# Patient Record
Sex: Female | Born: 1960 | Race: Black or African American | Hispanic: No | Marital: Married | Smoking: Never smoker
Health system: Southern US, Community
[De-identification: ages and names within clinical notes are randomized; demographics above are authoritative.]

## PROBLEM LIST (undated history)

## (undated) DIAGNOSIS — M5136 Other intervertebral disc degeneration, lumbar region: Secondary | ICD-10-CM

## (undated) DIAGNOSIS — K8020 Calculus of gallbladder without cholecystitis without obstruction: Secondary | ICD-10-CM

## (undated) DIAGNOSIS — M51369 Other intervertebral disc degeneration, lumbar region without mention of lumbar back pain or lower extremity pain: Secondary | ICD-10-CM

## (undated) DIAGNOSIS — S82899A Other fracture of unspecified lower leg, initial encounter for closed fracture: Secondary | ICD-10-CM

## (undated) DIAGNOSIS — I10 Essential (primary) hypertension: Secondary | ICD-10-CM

## (undated) HISTORY — PX: TUBAL LIGATION: SHX77

## (undated) HISTORY — PX: CHOLECYSTECTOMY: SHX55

---

## 1998-11-22 ENCOUNTER — Emergency Department (HOSPITAL_COMMUNITY): Admission: EM | Admit: 1998-11-22 | Discharge: 1998-11-22 | Payer: Self-pay | Admitting: Emergency Medicine

## 1998-11-22 ENCOUNTER — Encounter: Payer: Self-pay | Admitting: Emergency Medicine

## 1999-05-05 ENCOUNTER — Emergency Department (HOSPITAL_COMMUNITY): Admission: EM | Admit: 1999-05-05 | Discharge: 1999-05-05 | Payer: Self-pay | Admitting: Emergency Medicine

## 2002-03-10 ENCOUNTER — Emergency Department (HOSPITAL_COMMUNITY): Admission: EM | Admit: 2002-03-10 | Discharge: 2002-03-10 | Payer: Self-pay | Admitting: Emergency Medicine

## 2002-09-06 ENCOUNTER — Emergency Department (HOSPITAL_COMMUNITY): Admission: EM | Admit: 2002-09-06 | Discharge: 2002-09-06 | Payer: Self-pay

## 2006-08-19 ENCOUNTER — Emergency Department (HOSPITAL_COMMUNITY): Admission: EM | Admit: 2006-08-19 | Discharge: 2006-08-19 | Payer: Self-pay | Admitting: Emergency Medicine

## 2007-10-07 ENCOUNTER — Emergency Department (HOSPITAL_COMMUNITY): Admission: EM | Admit: 2007-10-07 | Discharge: 2007-10-07 | Payer: Self-pay | Admitting: *Deleted

## 2009-10-22 ENCOUNTER — Emergency Department (HOSPITAL_COMMUNITY): Admission: EM | Admit: 2009-10-22 | Discharge: 2009-10-22 | Payer: Self-pay | Admitting: Family Medicine

## 2009-12-13 ENCOUNTER — Encounter: Admission: RE | Admit: 2009-12-13 | Discharge: 2009-12-13 | Payer: Self-pay | Admitting: Obstetrics and Gynecology

## 2009-12-16 ENCOUNTER — Encounter: Admission: RE | Admit: 2009-12-16 | Discharge: 2009-12-16 | Payer: Self-pay | Admitting: Obstetrics and Gynecology

## 2010-03-16 HISTORY — PX: BREAST SURGERY: SHX581

## 2010-05-30 LAB — POCT URINALYSIS DIPSTICK
Glucose, UA: NEGATIVE mg/dL
Hgb urine dipstick: NEGATIVE
Specific Gravity, Urine: 1.025 (ref 1.005–1.030)
Urobilinogen, UA: 0.2 mg/dL (ref 0.0–1.0)

## 2010-11-04 ENCOUNTER — Emergency Department (HOSPITAL_COMMUNITY): Payer: No Typology Code available for payment source

## 2010-11-04 ENCOUNTER — Emergency Department
Admit: 2010-11-04 | Discharge: 2010-11-04 | Disposition: A | Discharge status: Home / Self Care | Payer: No Typology Code available for payment source | Attending: Emergency Medicine | Admitting: Emergency Medicine

## 2010-11-04 ENCOUNTER — Emergency Department (HOSPITAL_COMMUNITY)
Admission: EM | Admit: 2010-11-04 | Discharge: 2010-11-04 | Disposition: A | Discharge status: Home / Self Care | Payer: No Typology Code available for payment source | Attending: Emergency Medicine | Admitting: Emergency Medicine

## 2010-11-04 DIAGNOSIS — R071 Chest pain on breathing: Secondary | ICD-10-CM | POA: Insufficient documentation

## 2010-11-04 DIAGNOSIS — R0609 Other forms of dyspnea: Secondary | ICD-10-CM | POA: Insufficient documentation

## 2010-11-04 DIAGNOSIS — N63 Unspecified lump in unspecified breast: Secondary | ICD-10-CM | POA: Insufficient documentation

## 2010-11-04 DIAGNOSIS — N6320 Unspecified lump in the left breast, unspecified quadrant: Secondary | ICD-10-CM

## 2010-11-04 DIAGNOSIS — R0989 Other specified symptoms and signs involving the circulatory and respiratory systems: Secondary | ICD-10-CM | POA: Insufficient documentation

## 2010-11-04 LAB — POCT I-STAT, CHEM 8
Chloride: 109 mEq/L (ref 96–112)
Creatinine, Ser: 1 mg/dL (ref 0.50–1.10)
Glucose, Bld: 91 mg/dL (ref 70–99)
Potassium: 4.2 mEq/L (ref 3.5–5.1)

## 2010-11-04 LAB — CBC
HCT: 35.6 % — ABNORMAL LOW (ref 36.0–46.0)
Hemoglobin: 12.1 g/dL (ref 12.0–15.0)
MCH: 32.7 pg (ref 26.0–34.0)
MCHC: 34 g/dL (ref 30.0–36.0)

## 2010-11-04 LAB — DIFFERENTIAL
Lymphocytes Relative: 35 % (ref 12–46)
Monocytes Absolute: 0.7 10*3/uL (ref 0.1–1.0)
Monocytes Relative: 8 % (ref 3–12)
Neutro Abs: 4.6 10*3/uL (ref 1.7–7.7)

## 2010-11-04 LAB — CK TOTAL AND CKMB (NOT AT ARMC)
CK, MB: 1.5 ng/mL (ref 0.3–4.0)
Relative Index: INVALID (ref 0.0–2.5)
Total CK: 63 U/L (ref 7–177)

## 2010-11-04 LAB — POCT I-STAT TROPONIN I

## 2010-11-04 MED ORDER — IOHEXOL 300 MG/ML  SOLN
100.0000 mL | Freq: Once | INTRAMUSCULAR | Status: AC | PRN
  Administered 2010-11-04: 100 mL via INTRAVENOUS

## 2011-04-13 ENCOUNTER — Other Ambulatory Visit: Payer: Self-pay | Admitting: Obstetrics and Gynecology

## 2011-04-13 DIAGNOSIS — N6009 Solitary cyst of unspecified breast: Secondary | ICD-10-CM

## 2011-04-24 ENCOUNTER — Ambulatory Visit
Admission: RE | Admit: 2011-04-24 | Discharge: 2011-04-24 | Disposition: A | Discharge status: Home / Self Care | Payer: No Typology Code available for payment source | Source: Ambulatory Visit | Attending: Obstetrics and Gynecology | Admitting: Obstetrics and Gynecology

## 2011-04-24 DIAGNOSIS — N6009 Solitary cyst of unspecified breast: Secondary | ICD-10-CM

## 2012-04-07 ENCOUNTER — Emergency Department (HOSPITAL_COMMUNITY)
Admission: EM | Admit: 2012-04-07 | Discharge: 2012-04-07 | Disposition: A | Discharge status: Home / Self Care | Payer: No Typology Code available for payment source | Attending: Emergency Medicine | Admitting: Emergency Medicine

## 2012-04-07 ENCOUNTER — Encounter (HOSPITAL_COMMUNITY): Payer: Self-pay | Admitting: Emergency Medicine

## 2012-04-07 ENCOUNTER — Emergency Department (HOSPITAL_COMMUNITY): Payer: No Typology Code available for payment source

## 2012-04-07 DIAGNOSIS — M62830 Muscle spasm of back: Secondary | ICD-10-CM

## 2012-04-07 DIAGNOSIS — Z8719 Personal history of other diseases of the digestive system: Secondary | ICD-10-CM | POA: Insufficient documentation

## 2012-04-07 DIAGNOSIS — R0602 Shortness of breath: Secondary | ICD-10-CM | POA: Insufficient documentation

## 2012-04-07 DIAGNOSIS — M62838 Other muscle spasm: Secondary | ICD-10-CM | POA: Insufficient documentation

## 2012-04-07 DIAGNOSIS — R071 Chest pain on breathing: Secondary | ICD-10-CM | POA: Insufficient documentation

## 2012-04-07 DIAGNOSIS — R0789 Other chest pain: Secondary | ICD-10-CM

## 2012-04-07 HISTORY — DX: Calculus of gallbladder without cholecystitis without obstruction: K80.20

## 2012-04-07 LAB — CBC
MCH: 34.8 pg — ABNORMAL HIGH (ref 26.0–34.0)
MCV: 100 fL (ref 78.0–100.0)
Platelets: 213 10*3/uL (ref 150–400)
RBC: 4.08 MIL/uL (ref 3.87–5.11)
RDW: 13 % (ref 11.5–15.5)

## 2012-04-07 LAB — BASIC METABOLIC PANEL
BUN: 8 mg/dL (ref 6–23)
CO2: 27 mEq/L (ref 19–32)
Calcium: 9.7 mg/dL (ref 8.4–10.5)
Creatinine, Ser: 0.91 mg/dL (ref 0.50–1.10)

## 2012-04-07 LAB — POCT I-STAT TROPONIN I

## 2012-04-07 LAB — PROTIME-INR
INR: 0.97 (ref 0.00–1.49)
Prothrombin Time: 12.8 seconds (ref 11.6–15.2)

## 2012-04-07 MED ORDER — ASPIRIN 325 MG PO TABS
325.0000 mg | ORAL_TABLET | ORAL | Status: AC
  Administered 2012-04-07: 325 mg via ORAL
  Filled 2012-04-07: qty 1

## 2012-04-07 MED ORDER — OXYCODONE-ACETAMINOPHEN 5-325 MG PO TABS: 2.0000 | ORAL_TABLET | ORAL | Status: DC | PRN

## 2012-04-07 MED ORDER — DIAZEPAM 5 MG PO TABS
5.0000 mg | ORAL_TABLET | Freq: Once | ORAL | Status: AC
  Administered 2012-04-07: 5 mg via ORAL
  Filled 2012-04-07: qty 1

## 2012-04-07 MED ORDER — NITROGLYCERIN 0.4 MG SL SUBL
0.4000 mg | SUBLINGUAL_TABLET | SUBLINGUAL | Status: DC | PRN
  Administered 2012-04-07: 0.4 mg via SUBLINGUAL
  Filled 2012-04-07: qty 25

## 2012-04-07 MED ORDER — IBUPROFEN 800 MG PO TABS: 800.0000 mg | ORAL_TABLET | Freq: Three times a day (TID) | ORAL | Status: DC

## 2012-04-07 MED ORDER — CYCLOBENZAPRINE HCL 10 MG PO TABS: 10.0000 mg | ORAL_TABLET | Freq: Two times a day (BID) | ORAL | Status: DC | PRN

## 2012-04-07 MED ORDER — KETOROLAC TROMETHAMINE 30 MG/ML IJ SOLN
30.0000 mg | Freq: Once | INTRAMUSCULAR | Status: AC
  Administered 2012-04-07: 30 mg via INTRAVENOUS
  Filled 2012-04-07: qty 1

## 2012-04-07 NOTE — ED Notes (Addendum)
Pt reports chest pain since 8 am this morning with pain on inspiration. Pain is located around right breast. Pt states she has had muscle spasms before and this feels the same.  Pt reports shortness of breath also.

## 2012-04-07 NOTE — ED Provider Notes (Addendum)
History  This chart was scribed for Glynn Octave, MD by Bennett Scrape, ED Scribe. This patient was seen in room D32C/D32C and the patient's care was started at 2:53 PM.  CSN: [id number]  Arrival date & time 04/07/12  1422   First MD Initiated Contact with Patient 04/07/12 1453      Chief Complaint  Patient presents with  . Chest Pain     The history is provided by the patient. No language interpreter was used.    Jamie Jenkins is a 52 y.o. female who presents to the Emergency Department complaining of approximately 7 hours (started at 8:30 AM) of sudden onset, waxing and waning, constant right-sided CP starting at the right sternum that radiates around to the mid back with associated SOB that she woke up with. She denies radiation down her right arm or up to her right neck. She states that the pain is worse with movement of the right arm and pressure on the back. She reports prior episodes of similar pain diagnosed as muscle spasms, the last episode occuring one year ago, and states that she was given muscle relaxants with improvement during those prior episodes. She denies any recent falls, traumas or rashes to the area. She denies having a h/o heart or lung problems, DM, HTN and HLD. She denies having prior cath or stress tests performed. She denies rash, abdominal pain, fevers, cough, diaphoresis and nausea as associated symptoms. She denies being on birth control currently. She has a h/o gallstones but denies similarities.   Past Medical History  Diagnosis Date  . Gall stones     History reviewed. No pertinent past surgical history.  History reviewed. No pertinent family history.  History  Substance Use Topics  . Smoking status: Not on file  . Smokeless tobacco: Not on file  . Alcohol Use:     No OB history provided.  Review of Systems  A complete 10 system review of systems was obtained and all systems are negative except as noted in the HPI and PMH.   Allergies   Review of patient's allergies indicates no known allergies.  Home Medications  No current outpatient prescriptions on file.  Triage vitals: BP 168/95  Pulse 67  Temp 97.6 F (36.4 C) (Oral)  Resp 19  SpO2 100%  Physical Exam  Nursing note and vitals reviewed. Constitutional: She is oriented to person, place, and time. She appears well-developed and well-nourished. No distress.       Uncomfortable appearing  HENT:  Head: Normocephalic and atraumatic.  Mouth/Throat: Oropharynx is clear and moist.  Eyes: Conjunctivae normal and EOM are normal. Pupils are equal, round, and reactive to light.  Neck: Neck supple. No tracheal deviation present.  Cardiovascular: Normal rate, regular rhythm and intact distal pulses.   Pulmonary/Chest: Effort normal and breath sounds normal. No respiratory distress. She exhibits tenderness (right chest wall tenderness to palpation).  Abdominal: Soft. There is no tenderness.  Musculoskeletal: Normal range of motion. She exhibits tenderness. She exhibits no edema.       Right upper thoracic paraspinal tenderness and spasm, no C or L spine pain  Neurological: She is alert and oriented to person, place, and time.       Equal grip strengths, 5/5 strength throughout  Skin: Skin is warm and dry.  Psychiatric: She has a normal mood and affect. Her behavior is normal.    ED Course  Procedures (including critical care time)  DIAGNOSTIC STUDIES: Oxygen Saturation is 100% on room  air, normal by my interpretation.    COORDINATION OF CARE: 2:32 PM- Acknowledged 0.4 mg nitro ordered  2:45 PM- Acknowledged 325 mg ASA ordered  3:14 PM- Advised pt that her pain is atypical and therefore, I do not believe that it cardiac related. Discussed treatment plan which includes pain medication,CXR, CBC panel, troponin and d-dimer with pt at bedside and pt agreed to plan.   3:15 PM- Ordered 30 mg Toradol injection and 5 mg Valium tablet  Labs Reviewed  CBC - Abnormal;  Notable for the following:    MCH 34.8 (*)     All other components within normal limits  BASIC METABOLIC PANEL - Abnormal; Notable for the following:    Glucose, Bld 115 (*)     GFR calc non Af Amer 72 (*)     GFR calc Af Amer 83 (*)     All other components within normal limits  TROPONIN I  PROTIME-INR  D-DIMER, QUANTITATIVE  POCT I-STAT TROPONIN I   Dg Chest 2 View  04/07/2012  *RADIOLOGY REPORT*  Clinical Data: Chest pain  CHEST - 2 VIEW  Comparison: Chest x-ray of 11/04/2010 and CT angio chest of the same date  Findings: No active infiltrate or effusion is seen.  Mild linear scarring or atelectasis is noted in the right lower lobe posteriorly.  Mediastinal contours appear stable.  The heart is within normal limits in size.  No skeletal abnormality is seen. Surgical clips are noted in the right upper quadrant from prior cholecystectomy.  IMPRESSION: Stable chest x-ray.  No active lung disease.  Mild linear atelectasis or scarring in the right lower lobe.   Original Report Authenticated By: Dwyane Dee, M.D.      No diagnosis found.    MDM  Right-sided chest and upper back pain awoke the patient from sleep. Constant associated with shortness of breath and pain with breathing. History of similar pain due to muscle spasm. Denies cardiac or pulmonary history. Never has had cardiac workup.  Patient is very uncomfortable with reproducible right-sided chest and upper back pain with spasm. Pain is worse with arm movement. It is clearly atypical for ACS or PE.  Chest x-ray negative. D-dimer negative, troponin negative. No neuro or pulse deficits to suggest dissection. Bilateral blood pressures equal. Pain improved with medication. Pain consistent with chest wall pain and spasm. Atypical for ACS or PE. Pain worse with palpation and worse with arm movement. Patient reassured.   Date: 04/07/2012  Rate: 65  Rhythm: normal sinus rhythm  QRS Axis: normal  Intervals: normal  ST/T Wave  abnormalities: nonspecific ST changes  Conduction Disutrbances:none  Narrative Interpretation: Unchanged septal Q waves  Old EKG Reviewed: unchanged   I personally performed the services described in this documentation, which was scribed in my presence. The recorded information has been reviewed and is accurate.    Glynn Octave, MD 04/07/12 1702  Glynn Octave, MD 04/07/12 [phone]

## 2012-04-07 NOTE — ED Notes (Signed)
C/o right side CP onset upon awakening at 0800. Denies SOB, n/v, diaphoresis, cold, cough. Pain constant with breathing which worsens with deeper breaths, palpation & certain mvmt. States had exact same kind of pain in chest in past & was Dx with muscle spasms. Denies any heavy lifting or injury

## 2012-06-15 ENCOUNTER — Encounter (HOSPITAL_COMMUNITY): Payer: Self-pay | Admitting: *Deleted

## 2012-06-15 ENCOUNTER — Emergency Department (HOSPITAL_COMMUNITY)
Admission: EM | Admit: 2012-06-15 | Discharge: 2012-06-15 | Disposition: A | Discharge status: Home / Self Care | Payer: PRIVATE HEALTH INSURANCE | Attending: Emergency Medicine | Admitting: Emergency Medicine

## 2012-06-15 ENCOUNTER — Emergency Department (HOSPITAL_COMMUNITY): Payer: PRIVATE HEALTH INSURANCE

## 2012-06-15 DIAGNOSIS — Z8719 Personal history of other diseases of the digestive system: Secondary | ICD-10-CM | POA: Insufficient documentation

## 2012-06-15 DIAGNOSIS — S92912A Unspecified fracture of left toe(s), initial encounter for closed fracture: Secondary | ICD-10-CM

## 2012-06-15 DIAGNOSIS — IMO0002 Reserved for concepts with insufficient information to code with codable children: Secondary | ICD-10-CM | POA: Insufficient documentation

## 2012-06-15 DIAGNOSIS — Y929 Unspecified place or not applicable: Secondary | ICD-10-CM | POA: Insufficient documentation

## 2012-06-15 DIAGNOSIS — Y939 Activity, unspecified: Secondary | ICD-10-CM | POA: Insufficient documentation

## 2012-06-15 DIAGNOSIS — S92919A Unspecified fracture of unspecified toe(s), initial encounter for closed fracture: Secondary | ICD-10-CM | POA: Insufficient documentation

## 2012-06-15 NOTE — ED Notes (Signed)
Pt reports hitting 4th toe on left foot on table one week ago and injuring again last night by kicking shoe.  Pt reports pain 8/10 which increases with movement and she feels as if she cannot move the toe. No obvious deformity no obvious swelling.  Pt alert oriented X4

## 2012-06-15 NOTE — ED Notes (Signed)
Pt reports hitting left foot 4th toe over one week ago and still having pain and swelling. Ambulatory at triage.

## 2012-06-15 NOTE — ED Provider Notes (Signed)
Medical screening examination/treatment/procedure(s) were performed by non-physician practitioner and as supervising physician I was immediately available for consultation/collaboration.   Jelina Paulsen III, MD 06/15/12 2114 

## 2012-06-15 NOTE — ED Provider Notes (Signed)
History     CSN: [id number]  Arrival date & time 06/15/12  0915   First MD Initiated Contact with Patient 06/15/12 [phone]      Chief Complaint  Patient presents with  . Toe Pain    (Consider location/radiation/quality/duration/timing/severity/associated sxs/prior treatment) HPI  SUBJECTIVE: Jamie Jenkins is a 52 y.o. female who sustained a left toe injury 2 day(s) ago. Mechanism of injury: stubbed her left 4th toe against her husbands shoe. Immediate symptoms: immediate pain, immediate swelling. Symptoms have been acute since that time. Prior history of related problems: no prior problems with this area in the past.    Past Medical History  Diagnosis Date  . Gall stones     History reviewed. No pertinent past surgical history.  History reviewed. No pertinent family history.  History  Substance Use Topics  . Smoking status: Not on file  . Smokeless tobacco: Not on file  . Alcohol Use: No    OB History   Grav Para Term Preterm Abortions TAB SAB Ect Mult Living                  Review of Systems  Constitutional: Negative for fever and chills.  HENT: Negative for trouble swallowing.   Respiratory: Negative for shortness of breath.   Cardiovascular: Negative for chest pain.  Gastrointestinal: Negative for nausea, vomiting, abdominal pain, diarrhea and constipation.  Genitourinary: Negative for dysuria and hematuria.  Musculoskeletal: Positive for joint swelling and gait problem. Negative for myalgias and arthralgias.  Skin: Negative for rash.  Neurological: Negative for numbness.  All other systems reviewed and are negative.    Allergies  Review of patient's allergies indicates no known allergies.  Home Medications  No current outpatient prescriptions on file.  BP 164/91  Pulse 79  Temp(Src) 98 F (36.7 C) (Oral)  Resp 18  SpO2 97%  LMP 06/15/2012  Physical Exam  Nursing note and vitals reviewed. Constitutional: She is oriented to person, place,  and time. She appears well-developed and well-nourished. No distress.  HENT:  Head: Normocephalic and atraumatic.  Mouth/Throat: No oropharyngeal exudate.  Eyes: Conjunctivae are normal. Pupils are equal, round, and reactive to light. No scleral icterus.  Neck: Normal range of motion.  Cardiovascular: Normal rate, regular rhythm, normal heart sounds and intact distal pulses.   Pulmonary/Chest: Effort normal and breath sounds normal. No respiratory distress.  Abdominal: Soft. Bowel sounds are normal. There is no tenderness.  Musculoskeletal: Normal range of motion. She exhibits edema and tenderness.  Swelling and pain left 3rd and 4th toes. Cap refill<3 sec.  Neurological: She is alert and oriented to person, place, and time.  Skin: Skin is warm and dry. She is not diaphoretic.    ED Course  Procedures (including critical care time)  Labs Reviewed - No data to display Dg Foot Complete Left  06/15/2012  *RADIOLOGY REPORT*  Clinical Data: Fourth toe pain post injury  LEFT FOOT - COMPLETE 3+ VIEW  Comparison: None.  Findings: Three views of the left foot submitted.  There is oblique nondisplaced fracture proximal phalanx fourth toe.  IMPRESSION: Oblique nondisplaced fracture proximal phalanx fourth toe.   Original Report Authenticated By: Natasha Mead, M.D.      1. Fracture of toe of left foot, closed, initial encounter       MDM  12:13 PM BP 164/91  Pulse 79  Temp(Src) 98 F (36.7 C) (Oral)  Resp 18  SpO2 97%  LMP 06/15/2012 4th toe fracture. RICE and work note.  Supportive care with buddy tape, post iop shoe, and crutches.  Follow up with PCP.         Arthor Captain, PA-C 06/15/12 1238

## 2012-08-24 ENCOUNTER — Encounter (HOSPITAL_COMMUNITY): Payer: Self-pay | Admitting: Physical Medicine and Rehabilitation

## 2012-08-24 ENCOUNTER — Emergency Department (HOSPITAL_COMMUNITY)
Admission: EM | Admit: 2012-08-24 | Discharge: 2012-08-24 | Disposition: A | Discharge status: Home / Self Care | Payer: PRIVATE HEALTH INSURANCE | Attending: Emergency Medicine | Admitting: Emergency Medicine

## 2012-08-24 DIAGNOSIS — IMO0002 Reserved for concepts with insufficient information to code with codable children: Secondary | ICD-10-CM | POA: Insufficient documentation

## 2012-08-24 DIAGNOSIS — L02412 Cutaneous abscess of left axilla: Secondary | ICD-10-CM

## 2012-08-24 DIAGNOSIS — Z8719 Personal history of other diseases of the digestive system: Secondary | ICD-10-CM | POA: Insufficient documentation

## 2012-08-24 MED ORDER — MIDAZOLAM HCL 2 MG/2ML IJ SOLN
2.0000 mg | Freq: Once | INTRAMUSCULAR | Status: AC
  Administered 2012-08-24: 2 mg via INTRAVENOUS
  Filled 2012-08-24: qty 2

## 2012-08-24 MED ORDER — CEPHALEXIN 500 MG PO CAPS: 500.0000 mg | ORAL_CAPSULE | Freq: Three times a day (TID) | ORAL | Status: DC

## 2012-08-24 MED ORDER — PROPOFOL 10 MG/ML IV BOLUS
1.0000 mg/kg | Freq: Once | INTRAVENOUS | Status: AC
  Administered 2012-08-24: 40 mg via INTRAVENOUS
  Filled 2012-08-24 (×2): qty 20

## 2012-08-24 MED ORDER — OXYCODONE-ACETAMINOPHEN 5-325 MG PO TABS
1.0000 | ORAL_TABLET | Freq: Once | ORAL | Status: AC
  Administered 2012-08-24: 1 via ORAL
  Filled 2012-08-24: qty 1

## 2012-08-24 MED ORDER — OXYCODONE-ACETAMINOPHEN 5-325 MG PO TABS: 1.0000 | ORAL_TABLET | ORAL | Status: DC | PRN

## 2012-08-24 MED ORDER — PROPOFOL 10 MG/ML IV BOLUS
INTRAVENOUS | Status: AC | PRN
  Administered 2012-08-24: 20 mg via INTRAVENOUS

## 2012-08-24 MED ORDER — FENTANYL CITRATE 0.05 MG/ML IJ SOLN
100.0000 ug | Freq: Once | INTRAMUSCULAR | Status: AC
  Administered 2012-08-24: 100 ug via INTRAVENOUS
  Filled 2012-08-24: qty 2

## 2012-08-24 NOTE — ED Provider Notes (Signed)
History     CSN: [id number]  Arrival date & time 08/24/12  6578   First MD Initiated Contact with Patient 08/24/12 [phone]      Chief Complaint  Patient presents with  . Abscess    (Consider location/radiation/quality/duration/timing/severity/associated sxs/prior treatment) HPI Jamie Jenkins is a 52 y.o. female who presents to ED with complaint of left axillary abscess. States it started 6 days ago, gradually worsening. States tried over the counter "boil ease" and putting warm compresses with no relief. States hx of the same 15 years ago. Pt denies fever, chills, malaise. No drainage. No other complaints.    Past Medical History  Diagnosis Date  . Gall stones     No past surgical history on file.  No family history on file.  History  Substance Use Topics  . Smoking status: Never Smoker   . Smokeless tobacco: Not on file  . Alcohol Use: No    OB History   Grav Para Term Preterm Abortions TAB SAB Ect Mult Living                  Review of Systems  Constitutional: Negative for fever and chills.  Respiratory: Negative.   Cardiovascular: Negative.   Skin:       Positive for an abscess    Allergies  Review of patient's allergies indicates no known allergies.  Home Medications  No current outpatient prescriptions on file.  BP 162/96  Pulse 76  Temp(Src) 98 F (36.7 C) (Oral)  Resp 18  SpO2 98%  Physical Exam  Nursing note and vitals reviewed. Constitutional: She appears well-developed and well-nourished. No distress.  Eyes: Conjunctivae are normal.  Neck: Neck supple.  Cardiovascular: Normal rate, regular rhythm and normal heart sounds.   Pulmonary/Chest: Effort normal and breath sounds normal. No respiratory distress. She has no wheezes. She has no rales.  Musculoskeletal:  Two large abscess to the left axilla with mild surrounding erythema, tender to palpation.   Skin: Skin is warm and dry.    ED Course  Procedures (including critical care  time)  Pt with severe pain, unable to perform a good exam. She is resistant to any procedures. Discussed conscious sedation. Will proceed.   INCISION AND DRAINAGE Performed by: Jaynie Crumble A Consent: Verbal consent obtained. Risks and benefits: risks, benefits and alternatives were discussed Type: abscess  Body area: left axilla  Anesthesia: local infiltration  Incision was made with a scalpel.  Local anesthetic: lidocaine 2% w epinephrine  Anesthetic total: 3 ml  Complexity: complex Blunt dissection to break up loculations  Drainage: purulent, thick material expressed as well as clear and purulent drainage  Drainage amount: large  Packing material: 1/4 in iodoform gauze  Patient tolerance: Patient tolerated the procedure well with no immediate complications.     1. Abscess of left axilla       MDM  Pt with large abscess to left axilla. conscious sedation used for procedure. I&D performed. Packed. Pt monitored for an hour post procedure. VS normal. Pt is AAO x3 and in no distress, reporting pain to left axilla. PT otherwise non toxic, afebrile, no sings of systemic infection. Pt will be d/c home with antibiotic and pain medication with follow up in 2 days.   Filed Vitals:   08/24/12 1005 08/24/12 1015 08/24/12 1031 08/24/12 1058  BP: 132/72 119/63 115/61 123/60  Pulse: 68 69 62 68  Temp:      TempSrc:      Resp: 18 20  20   Weight:      SpO2: 99% 98% 99% 98%           Lottie Mussel, PA-C 08/24/12 1116

## 2012-08-24 NOTE — ED Notes (Signed)
Pt presents to department for evaluation of multiple abscess' under L arm. Ongoing since Sunday. States pain and swelling has become worse since onset. 10/10 pain at the time. Pt is alert and oriented x4. No signs of acute distress noted.

## 2012-08-24 NOTE — ED Provider Notes (Signed)
Pt presents with L axillary abscess which started on Thursday - gradually worsening and on exam had swelling, induration and fluctuance of what appears to be a large abscess in L axilla.  No fevers, not a DM, lungs and heart clear.  Will assist with sedation as the pt is in severe pain from this and is avoiding any examination of the abscess.  Medical screening examination/treatment/procedure(s) were conducted as a shared visit with non-physician practitioner(s) and myself.  I personally evaluated the patient during the encounter    Vida Roller, MD 08/24/12 [phone]

## 2012-08-24 NOTE — ED Provider Notes (Signed)
Medical screening examination/treatment/procedure(s) were conducted as a shared visit with non-physician practitioner(s) and myself.  I personally evaluated the patient during the encounter   I personally evaluated the pt along with the PA, I also performed procedural sedation for incision and drainage for the large, tender appearing abscess.  Procedural sedation Performed by: Vida Roller Consent: Verbal consent obtained. Risks and benefits: risks, benefits and alternatives were discussed Required items: required blood products, implants, devices, and special equipment available Patient identity confirmed: arm band and provided demographic data Time out: Immediately prior to procedure a "time out" was called to verify the correct patient, procedure, equipment, support staff and site/side marked as required.  Sedation type: moderate (conscious) sedation NPO time confirmed and considedered  Sedatives: PROPOFOL, versed, fentanyl  Physician Time at Bedside: 25  Vitals: Vital signs were monitored during sedation. Cardiac Monitor, pulse oximeter Patient tolerance: Patient tolerated the procedure well with no immediate complications. Comments: Pt with uneventful recovered. Returned to pre-procedural sedation baseline     Vida Roller, MD 08/24/12 [phone]

## 2012-08-24 NOTE — ED Notes (Addendum)
Pt tolerating procedure without difficulty. Vital signs stable. PA at bedside packing abscess, pt is beginning to awake and is responding to verbal stimuli.

## 2012-08-26 ENCOUNTER — Emergency Department (HOSPITAL_COMMUNITY)
Admission: EM | Admit: 2012-08-26 | Discharge: 2012-08-26 | Disposition: A | Discharge status: Home / Self Care | Payer: PRIVATE HEALTH INSURANCE | Attending: Emergency Medicine | Admitting: Emergency Medicine

## 2012-08-26 ENCOUNTER — Encounter (HOSPITAL_COMMUNITY): Payer: Self-pay | Admitting: Emergency Medicine

## 2012-08-26 DIAGNOSIS — Z4801 Encounter for change or removal of surgical wound dressing: Secondary | ICD-10-CM | POA: Insufficient documentation

## 2012-08-26 DIAGNOSIS — Z5189 Encounter for other specified aftercare: Secondary | ICD-10-CM

## 2012-08-26 DIAGNOSIS — Z8719 Personal history of other diseases of the digestive system: Secondary | ICD-10-CM | POA: Insufficient documentation

## 2012-08-26 DIAGNOSIS — IMO0002 Reserved for concepts with insufficient information to code with codable children: Secondary | ICD-10-CM | POA: Insufficient documentation

## 2012-08-26 NOTE — ED Notes (Signed)
PA-C at bedside 

## 2012-08-26 NOTE — ED Notes (Signed)
Pt here for re-check of wound that was drained on Wed. Pt was told to return today. Wound under left arm. Pt has been taking prescribed abx and denies fever.

## 2012-08-26 NOTE — ED Provider Notes (Signed)
History     CSN: [id number]  Arrival date & time 08/26/12  7829   First MD Initiated Contact with Patient 08/26/12 [phone]      Chief Complaint  Patient presents with  . Wound Check    (Consider location/radiation/quality/duration/timing/severity/associated sxs/prior treatment) HPI Comments: Patient presents to the ED for wound check. Abscess was drained on Wednesday, 08/24/2012 under conscious sedation. Patient was told to followup in 2 days for wound check. She states wound has been draining a large amount of purulent fluid. She has been taking prescribed antibiotics (Keflex) and pain meds as directed. Denies any recent fevers, sweats, or chills.  Patient is a 52 y.o. female presenting with wound check. The history is provided by the patient.  Wound Check    Past Medical History  Diagnosis Date  . Gall stones     History reviewed. No pertinent past surgical history.  No family history on file.  History  Substance Use Topics  . Smoking status: Never Smoker   . Smokeless tobacco: Not on file  . Alcohol Use: No    OB History   Grav Para Term Preterm Abortions TAB SAB Ect Mult Living                  Review of Systems  Skin:       Abscess re-check  All other systems reviewed and are negative.    Allergies  Review of patient's allergies indicates no known allergies.  Home Medications   Current Outpatient Rx  Name  Route  Sig  Dispense  Refill  . cephALEXin (KEFLEX) 500 MG capsule   Oral   Take 1 capsule (500 mg total) by mouth 3 (three) times daily.   30 capsule   0   . oxyCODONE-acetaminophen (PERCOCET) 5-325 MG per tablet   Oral   Take 1 tablet by mouth every 4 (four) hours as needed for pain.   20 tablet   0     BP 187/98  Pulse 74  Temp(Src) 97.7 F (36.5 C) (Oral)  SpO2 99%  Physical Exam  Nursing note and vitals reviewed. Constitutional: She is oriented to person, place, and time. She appears well-developed and well-nourished. No  distress.  HENT:  Head: Normocephalic and atraumatic.  Eyes: Conjunctivae and EOM are normal.  Neck: Normal range of motion. Neck supple.  Cardiovascular: Normal rate, regular rhythm and normal heart sounds.   Pulmonary/Chest: Effort normal and breath sounds normal. No respiratory distress.  Musculoskeletal: Normal range of motion.  Neurological: She is alert and oriented to person, place, and time.  Skin: Skin is warm and dry. No erythema.  Large abscess of left axilla draining copious amount of purulent fluid, remains TTP, no surrounding erythema or evidence of cellulitis  Psychiatric: She has a normal mood and affect.    ED Course  Wound packing Date/Time: 08/26/2012 7:41 AM Performed by: Garlon Hatchet Authorized by: Garlon Hatchet Consent: Verbal consent obtained. Consent given by: patient Preparation: Patient was prepped and draped in the usual sterile fashion. Local anesthesia used: no Patient sedated: no Patient tolerance: Patient tolerated the procedure well with no immediate complications. Comments: Dressing and packing removed from left axilla.  Wound was irrigated with saline and re-packed.    Labs Reviewed - No data to display No results found.   1. Wound check, abscess       MDM   Abscess continuing to drain copious amount of purulent fluid requiring further packing.   Procedure as  above.  Instructed to FU with PCP on Monday for packing removal.  Continue taking abx and pain meds as directed.  Discussed plan with pt, she agreed.  Return precautions advised.       Garlon Hatchet, PA-C 08/26/12 1540

## 2012-08-27 NOTE — ED Provider Notes (Signed)
Medical screening examination/treatment/procedure(s) were performed by non-physician practitioner and as supervising physician I was immediately available for consultation/collaboration.   Joya Gaskins, MD 08/27/12 [phone]

## 2012-08-29 ENCOUNTER — Encounter (HOSPITAL_COMMUNITY): Payer: Self-pay | Admitting: Emergency Medicine

## 2012-08-29 ENCOUNTER — Emergency Department (INDEPENDENT_AMBULATORY_CARE_PROVIDER_SITE_OTHER)
Admission: EM | Admit: 2012-08-29 | Discharge: 2012-08-29 | Disposition: A | Discharge status: Home / Self Care | Payer: PRIVATE HEALTH INSURANCE | Source: Home / Self Care | Attending: Emergency Medicine | Admitting: Emergency Medicine

## 2012-08-29 DIAGNOSIS — Z4800 Encounter for change or removal of nonsurgical wound dressing: Secondary | ICD-10-CM

## 2012-08-29 DIAGNOSIS — L0291 Cutaneous abscess, unspecified: Secondary | ICD-10-CM

## 2012-08-29 NOTE — ED Notes (Signed)
Reports she is feeling much better

## 2012-08-29 NOTE — ED Provider Notes (Signed)
Chief Complaint:   Chief Complaint  Patient presents with  . Wound Check    History of Present Illness:    Jamie Jenkins is a 52 year old female who had an abscess drained at the emergency room this past Monday, a week ago. This was in her left axilla. She returned for repacking on Friday. She's been taking her antibiotics, Keflex, since then. The pain is improving. She denies any fever or chills. She's had no prior history of abscesses, skin infections, or MRSA.  Review of Systems:  Other than noted above, the patient denies any of the following symptoms: Systemic:  No fever, chills or sweats. Skin:  No rash or itching.  PMFSH:  Past medical history, family history, social history, meds, and allergies were reviewed.  No history of diabetes or prior history of abscesses or MRSA.   Physical Exam:   Vital signs:  BP 156/100  Pulse 60  Temp(Src) 97.9 F (36.6 C) (Oral)  Resp 16  SpO2 100% Skin:  She has a dressing in place. This was removed. There is some iodoform packing in place. There still a little surrounding tenderness to palpation and induration.  Skin exam was otherwise normal.  No rash. Ext:  Distal pulses were full, patient has full ROM of all joints.  Procedure:  Verbal informed consent was obtained.  The patient was informed of the risks and benefits of the procedure and understands and accepts.  Identity of the patient was verified verbally and by wristband. The packing was removed. The wound cavity was irrigated with saline antibiotic ointment and a sterile dressing were applied.  Assessment:  The encounter diagnosis was Abscess.  Appears to be healing up well. We don't have any culture results. I instructed the patient in wound care and suggested she followup with [name] any sign of recurrence.  Plan:   1.  The following meds were prescribed:   Discharge Medication List as of 08/29/2012 11:06 AM     2.  The patient was instructed in symptomatic care and handouts  were given. 3.  The patient was instructed in wound care and should return again as needed.   Reuben Likes, MD 08/29/12 2200

## 2012-08-29 NOTE — ED Notes (Signed)
Reports left axilla i/d on 6/11, seen on 6/13 for recheck of wound.  Reports mced repacked this wound on 6/13.  Patient here today for wound care, packing removed

## 2012-11-21 ENCOUNTER — Ambulatory Visit (INDEPENDENT_AMBULATORY_CARE_PROVIDER_SITE_OTHER): Payer: PRIVATE HEALTH INSURANCE | Admitting: General Surgery

## 2012-11-21 ENCOUNTER — Emergency Department (HOSPITAL_COMMUNITY)
Admission: EM | Admit: 2012-11-21 | Discharge: 2012-11-21 | Disposition: A | Discharge status: Home / Self Care | Payer: PRIVATE HEALTH INSURANCE | Attending: Emergency Medicine | Admitting: Emergency Medicine

## 2012-11-21 ENCOUNTER — Encounter (INDEPENDENT_AMBULATORY_CARE_PROVIDER_SITE_OTHER): Payer: Self-pay | Admitting: General Surgery

## 2012-11-21 ENCOUNTER — Encounter (HOSPITAL_COMMUNITY): Payer: Self-pay | Admitting: Nurse Practitioner

## 2012-11-21 ENCOUNTER — Telehealth (HOSPITAL_COMMUNITY): Payer: Self-pay | Admitting: Emergency Medicine

## 2012-11-21 VITALS — BP 116/88 | HR 66 | Temp 96.3°F | Resp 16 | Ht 66.0 in | Wt 208.4 lb

## 2012-11-21 DIAGNOSIS — L02412 Cutaneous abscess of left axilla: Secondary | ICD-10-CM

## 2012-11-21 DIAGNOSIS — IMO0002 Reserved for concepts with insufficient information to code with codable children: Secondary | ICD-10-CM | POA: Insufficient documentation

## 2012-11-21 DIAGNOSIS — Z8719 Personal history of other diseases of the digestive system: Secondary | ICD-10-CM | POA: Insufficient documentation

## 2012-11-21 MED ORDER — OXYCODONE-ACETAMINOPHEN 5-325 MG PO TABS: 1.0000 | ORAL_TABLET | ORAL | Status: DC | PRN

## 2012-11-21 MED ORDER — SULFAMETHOXAZOLE-TRIMETHOPRIM 800-160 MG PO TABS: 1.0000 | ORAL_TABLET | Freq: Two times a day (BID) | ORAL | Status: DC

## 2012-11-21 MED ORDER — OXYCODONE-ACETAMINOPHEN 5-325 MG PO TABS
2.0000 | ORAL_TABLET | Freq: Once | ORAL | Status: AC
  Administered 2012-11-21: 2 via ORAL
  Filled 2012-11-21: qty 2

## 2012-11-21 NOTE — Progress Notes (Signed)
Patient ID: Jamie Jenkins, female   DOB: September 13, 1960, 52 y.o.   MRN: [id number]  Chief Complaint  Patient presents with  . Other    axillary abs    HPI Jamie Jenkins is a 52 y.o. female.  She is referred by Dr. Darlys Gales in the emergency department for evaluation of recurrent left axillary abscess.  She states that she has had a left axillary abscess 4 times now. Incision and drainage performed in 1989, unknown surgeon. Incision and drainage performed in the emergency department 1999. Incision and drainage performed in emergency department 4 months ago which healed. She now presents with a four-day history of painful swelling left axilla. She was seen in the emergency department today and referred to our office. She has a prescription for antibiotics and a prescription for Percocet. She has not had any drainage but it is very painful and she is declining any procedure in the office.  Comorbidities include obesity, status post cholecystectomy.  She is here in the office today with her husband. HPI  Past Medical History  Diagnosis Date  . Gall stones     Past Surgical History  Procedure Laterality Date  . Cholecystectomy      History reviewed. No pertinent family history.  Social History History  Substance Use Topics  . Smoking status: Never Smoker   . Smokeless tobacco: Not on file  . Alcohol Use: No    No Known Allergies  Current Outpatient Prescriptions  Medication Sig Dispense Refill  . cephALEXin (KEFLEX) 500 MG capsule Take 1 capsule (500 mg total) by mouth 3 (three) times daily.  30 capsule  0  . ibuprofen (ADVIL,MOTRIN) 200 MG tablet Take 400 mg by mouth every 6 (six) hours as needed for pain.      Marland Kitchen oxyCODONE-acetaminophen (PERCOCET/ROXICET) 5-325 MG per tablet Take 1 tablet by mouth every 4 (four) hours as needed for pain.  15 tablet  0   No current facility-administered medications for this visit.    Review of Systems Review of Systems   Constitutional: Negative for fever, chills and unexpected weight change.  HENT: Negative for hearing loss, congestion, sore throat, trouble swallowing and voice change.   Eyes: Negative for visual disturbance.  Respiratory: Negative for cough and wheezing.   Cardiovascular: Negative for chest pain, palpitations and leg swelling.  Gastrointestinal: Negative for nausea, vomiting, abdominal pain, diarrhea, constipation, blood in stool, abdominal distention and anal bleeding.  Genitourinary: Negative for hematuria, vaginal bleeding and difficulty urinating.  Musculoskeletal: Negative for arthralgias.  Skin: Negative for rash and wound.  Neurological: Negative for seizures, syncope and headaches.  Hematological: Negative for adenopathy. Does not bruise/bleed easily.  Psychiatric/Behavioral: Negative for confusion.    Blood pressure 116/88, pulse 66, temperature 96.3 F (35.7 C), temperature source Temporal, resp. rate 16, height 5\' 6"  (1.676 m), weight 208 lb 6.4 oz (94.53 kg).  Physical Exam Physical Exam  Constitutional: She is oriented to person, place, and time. She appears well-developed and well-nourished. No distress.  HENT:  Head: Normocephalic and atraumatic.  Nose: Nose normal.  Mouth/Throat: No oropharyngeal exudate.  Eyes: Conjunctivae and EOM are normal. Pupils are equal, round, and reactive to light. Left eye exhibits no discharge. No scleral icterus.  Tearful. Mildly injected.  Neck: Normal range of motion. Neck supple.  Cardiovascular: Normal rate, regular rhythm, normal heart sounds and intact distal pulses.   No murmur heard. Pulmonary/Chest: Effort normal and breath sounds normal. No respiratory distress. She has no wheezes.  She has no rales. She exhibits no tenderness.  Left axilla reveals 2 cm fluctuant abscess. Very tender. Appears localized. Scars noted. No other draining areas noted.  Abdominal: Soft. Bowel sounds are normal. She exhibits no distension and no mass.  There is no tenderness. There is no rebound and no guarding.  Musculoskeletal: She exhibits no edema and no tenderness.  Neurological: She is alert and oriented to person, place, and time. She exhibits normal muscle tone. Coordination normal.  Skin: Skin is warm. No rash noted. She is not diaphoretic. No erythema. No pallor.  Psychiatric: She has a normal mood and affect. Her behavior is normal. Judgment and thought content normal.  Cooperative and alert, but extremely anxious and tearful. Appears fearful of any examination.    Data Reviewed The emergency department records, somewhat incomplete  Assessment    Recurrent left axillary abscess  Obesity     Plan    I offered to attempt to drain this under local anesthesia in the office today, but the patient declines.  She will begin taking her antibiotics tonight.  She has a prescription for Percocet as well.  Warm soaks left axilla 4 times daily  Scheduled for incision and drainage of left axillary abscess under general anesthesia in the next 2-3 days. Schedule permitting.  I discussed the indications, details, techniques, and numerous risks of the surgery with the patient and her husband. They understand all of these issues and all their questions were answered. They agree with this plan.        Angelia Mould. Derrell Lolling, M.D., [facility] General and Minimally invasive Surgery Breast and Colorectal Surgery Office:   [phone] Pager:   [phone]  11/21/2012, 5:52 PM

## 2012-11-21 NOTE — Patient Instructions (Addendum)
You have a recurrent abscess in your left axilla.  We have offered to drain this in the office today, but you have stated that that will be to painful.  We understand.  Take the antibiotics that you have been given as prescribed  Warm soaks on the left under arm a 4-5 times a day  You'll be scheduled for surgery to drain the abscess under general anesthesia in the next few days.

## 2012-11-21 NOTE — ED Notes (Signed)
Pt has large swollen area to left axillary since Fri. Pt is tender to touch, has hx of same in same location.

## 2012-11-21 NOTE — ED Provider Notes (Signed)
CSN: [id number]     Arrival date & time 11/21/12  1005 History   First MD Initiated Contact with Patient 11/21/12 1129     Chief Complaint  Patient presents with  . Wound Infection    HPI   Jamie Jenkins is a 52 y.o. female with a PMH of gallstones who presents to the ED for evaluation of a wound infection.  History was provided by patient and her husband who is present in the ED.  Patient states that this is her third abscess in the same left axilla location in the past 6 months. She states that she's had 2 I&D's with antibiotic treatment (last in June - Keflex), however, her abscess appears to be recurrent.  Patient states that she developed worsening erythema and edema to the left axilla over the weekend (past three days). She denies any drainage or wounds.  Patient states that she has been trying topical cortisone and boil ease with no relief. She does not want an I&D at this time and states it hasn't been working.  She also states that last time she had an I&D she had to be put under conscious sedation and "will not do it again unless I am put out."  No fever, chills, rhinorrhea, congestion, headache, dizziness, lighteadedness, nausea, vomiting, chest pain, SOB, abdominal pain, weakness, loss of sensation, numbness, tingling, or leg edema.  Patient's BP is elevated at triage and when asked she states that "she is in so much pain" but has no hx of hypertension.     Past Medical History  Diagnosis Date  . Gall stones    Past Surgical History  Procedure Laterality Date  . Cholecystectomy     History reviewed. No pertinent family history. History  Substance Use Topics  . Smoking status: Never Smoker   . Smokeless tobacco: Not on file  . Alcohol Use: No   OB History   Grav Para Term Preterm Abortions TAB SAB Ect Mult Living                 Review of Systems  Constitutional: Negative for fever, chills, activity change, appetite change and fatigue.  HENT: Negative for  congestion, sore throat, rhinorrhea, neck pain and neck stiffness.   Eyes: Negative for visual disturbance.  Respiratory: Negative for cough, shortness of breath and wheezing.   Cardiovascular: Negative for chest pain, palpitations and leg swelling.  Gastrointestinal: Negative for nausea, vomiting, abdominal pain, diarrhea and constipation.  Genitourinary: Negative for dysuria and hematuria.  Musculoskeletal: Negative for myalgias, back pain and joint swelling.  Skin: Positive for color change (erythema) and wound.  Neurological: Negative for dizziness, syncope, weakness, light-headedness, numbness and headaches.    Allergies  Review of patient's allergies indicates no known allergies.  Home Medications   Current Outpatient Rx  Name  Route  Sig  Dispense  Refill  . ibuprofen (ADVIL,MOTRIN) 200 MG tablet   Oral   Take 400 mg by mouth every 6 (six) hours as needed for pain.         . cephALEXin (KEFLEX) 500 MG capsule   Oral   Take 1 capsule (500 mg total) by mouth 3 (three) times daily.   30 capsule   0    BP 167/106  Pulse 66  Temp(Src) 97.8 F (36.6 C) (Oral)  Resp 18  SpO2 100%  Filed Vitals:   11/21/12 1011 11/21/12 1305  BP: 167/106 156/107  Pulse: 66 64  Temp: 97.8 F (36.6 C) 97  F (36.1 C)  TempSrc: Oral Oral  Resp: 18 20  SpO2: 100% 100%   Physical Exam  Nursing note and vitals reviewed. Constitutional: She is oriented to person, place, and time. She appears well-developed and well-nourished. No distress.  Patient tearful.   HENT:  Head: Normocephalic and atraumatic.  Right Ear: External ear normal.  Left Ear: External ear normal.  Nose: Nose normal.  Mouth/Throat: Oropharynx is clear and moist. No oropharyngeal exudate.  Eyes: Conjunctivae are normal. Pupils are equal, round, and reactive to light. Right eye exhibits no discharge. Left eye exhibits no discharge.  Neck: Normal range of motion. Neck supple.  Cardiovascular: Normal rate, regular  rhythm, normal heart sounds and intact distal pulses.  Exam reveals no gallop and no friction rub.   No murmur heard. Radial pulses present and equal bilaterally  Pulmonary/Chest: Effort normal and breath sounds normal. No respiratory distress. She has no wheezes. She has no rales. She exhibits no tenderness.  Abdominal: Soft. Bowel sounds are normal. She exhibits no distension. There is no tenderness.  Musculoskeletal: Normal range of motion. She exhibits no edema and no tenderness.  Patient moving all upper extremities throughout exam  Neurological: She is alert and oriented to person, place, and time.  Gross sensation intact in the upper extremities bilaterally  Skin: Skin is warm and dry. She is not diaphoretic. There is erythema.  Erythema to the left axilla throughout with a 4 cm x 3 cm area of induration and small 2 cm x 1 cm overlying area of fluctuance.  Area is tender to palpation.  2-3 small pinpoint black open comedones present in the axillary region overlying the abscess.  No open wounds.  Scaring present over the abscess.      ED Course  Procedures (including critical care time) Labs Review Labs Reviewed - No data to display Imaging Review No results found.  MDM   1. Abscess of left axilla     Jamie Jenkins is a 52 y.o. female with a PMH of gallstones who presents to the ED for evaluation of a wound infection.  Percocet was ordered for symptomatic relief.     Etiology of axilla pain is likely due to an abscess.  Patient refused I&D at this time.  She was given referral to general surgery for further evaluation and management.  I suspect there may be a component of hidradenitis suppurativa given the recurrence.  Patient was very anxious, however, remained in no acute distress throughout her ED visit.  Patient was prescribed bactrim and Percocet for outpatient management.  She was instructed to make an appointment as soon as possible for a visit with surgery.  She  was instructed to return to the ED if she experiences any fever, worsening pain/redness/swelling, repeated emesis, or other concerns.  Patient was in agreement with discharge and plan.  Husband will drive patient home after discharge.     Final impressions: 1. Abscess, left axilla     Jamie Ee Farryn Linares PA-C         Jillyn Ledger, PA-C 11/21/12 2204

## 2012-11-21 NOTE — ED Notes (Signed)
Pt reports swollen painful abcess to L armpit onset Saturday. Tried topical cortisone and boil-ease with no relief

## 2012-11-22 ENCOUNTER — Encounter (HOSPITAL_COMMUNITY): Payer: Self-pay | Admitting: Pharmacy Technician

## 2012-11-22 NOTE — H&P (Signed)
Jamie Jenkins    MRN:  [id number]   Description: 52 year old female  Provider: Ernestene Mention, MD  Department: [facility]       Diagnoses    Abscess of axilla, left    -  Primary    682.3         Current Vitals    BP Pulse Temp(Src) Resp Ht Wt    116/88 66 96.3 F (35.7 C) (Temporal) 16 5\' 6"  (1.676 m) 208 lb 6.4 oz (94.53 kg)    BMI - 33.65 kg/m2                 History and Physical    Ernestene Mention, MD   Status: Signed                          HPI Jamie Jenkins is a 52 y.o. female.  She is referred by Dr. Darlys Gales in the emergency department for evaluation of recurrent left axillary abscess.   She states that she has had a left axillary abscess 4 times now. Incision and drainage performed in 1989, unknown surgeon. Incision and drainage performed in the emergency department 1999. Incision and drainage performed in emergency department 4 months ago which healed. She now presents with a four-day history of painful swelling left axilla. She was seen in the emergency department today and referred to our office. She has a prescription for antibiotics and a prescription for Percocet. She has not had any drainage but it is very painful and she is declining any procedure in the office.   Comorbidities include obesity, status post cholecystectomy.   She is here in the office today with her husband.       Past Medical History   Diagnosis  Date   .  Gall stones           Past Surgical History   Procedure  Laterality  Date   .  Cholecystectomy           History reviewed. No pertinent family history.   Social History History   Substance Use Topics   .  Smoking status:  Never Smoker    .  Smokeless tobacco:  Not on file   .  Alcohol Use:  No       No Known Allergies    Current Outpatient Prescriptions   Medication  Sig  Dispense  Refill   .  cephALEXin (KEFLEX) 500 MG capsule  Take 1 capsule (500 mg total) by  mouth 3 (three) times daily.   30 capsule   0   .  ibuprofen (ADVIL,MOTRIN) 200 MG tablet  Take 400 mg by mouth every 6 (six) hours as needed for pain.         Marland Kitchen  oxyCODONE-acetaminophen (PERCOCET/ROXICET) 5-325 MG per tablet  Take 1 tablet by mouth every 4 (four) hours as needed for pain.   15 tablet   0           Review of Systems   Constitutional: Negative for fever, chills and unexpected weight change.  HENT: Negative for hearing loss, congestion, sore throat, trouble swallowing and voice change.   Eyes: Negative for visual disturbance.  Respiratory: Negative for cough and wheezing.   Cardiovascular: Negative for chest pain, palpitations and leg swelling.  Gastrointestinal: Negative for nausea, vomiting, abdominal pain, diarrhea, constipation, blood in stool, abdominal distention and anal bleeding.  Genitourinary: Negative  for hematuria, vaginal bleeding and difficulty urinating.  Musculoskeletal: Negative for arthralgias.  Skin: Negative for rash and wound.  Neurological: Negative for seizures, syncope and headaches.  Hematological: Negative for adenopathy. Does not bruise/bleed easily.  Psychiatric/Behavioral: Negative for confusion.      Blood pressure 116/88, pulse 66, temperature 96.3 F (35.7 C), temperature source Temporal, resp. rate 16, height 5\' 6"  (1.676 m), weight 208 lb 6.4 oz (94.53 kg).   Physical Exam   Constitutional: She is oriented to person, place, and time. She appears well-developed and well-nourished. No distress.  HENT:   Head: Normocephalic and atraumatic.   Nose: Nose normal.   Mouth/Throat: No oropharyngeal exudate.  Eyes: Conjunctivae and EOM are normal. Pupils are equal, round, and reactive to light. Left eye exhibits no discharge. No scleral icterus.  Tearful. Mildly injected.  Neck: Normal range of motion. Neck supple.  Cardiovascular: Normal rate, regular rhythm, normal heart sounds and intact distal pulses.    No murmur  heard. Pulmonary/Chest: Effort normal and breath sounds normal. No respiratory distress. She has no wheezes. She has no rales. She exhibits no tenderness.  Left axilla reveals 2 cm fluctuant abscess. Very tender. Appears localized. Scars noted. No other draining areas noted.  Abdominal: Soft. Bowel sounds are normal. She exhibits no distension and no mass. There is no tenderness. There is no rebound and no guarding.  Musculoskeletal: She exhibits no edema and no tenderness.  Neurological: She is alert and oriented to person, place, and time. She exhibits normal muscle tone. Coordination normal.  Skin: Skin is warm. No rash noted. She is not diaphoretic. No erythema. No pallor.  Psychiatric: She has a normal mood and affect. Her behavior is normal. Judgment and thought content normal.  Cooperative and alert, but extremely anxious and tearful. Appears fearful of any examination.      Data Reviewed The emergency department records, somewhat incomplete   Assessment    Recurrent left axillary abscess   Obesity      Plan    I offered to attempt to drain this under local anesthesia in the office today, but the patient declines.   She will begin taking her antibiotics tonight.   She has a prescription for Percocet as well.   Warm soaks left axilla 4 times daily   Scheduled for incision and drainage of left axillary abscess under general anesthesia in the next 2-3 days. Schedule permitting.   I discussed the indications, details, techniques, and numerous risks of the surgery with the patient and her husband. They understand all of these issues and all their questions were answered. They agree with this plan.         Angelia Mould. Derrell Lolling, M.D., [facility] General and Minimally invasive Surgery Breast and Colorectal Surgery Office:   [phone] Pager:   [phone]

## 2012-11-22 NOTE — ED Provider Notes (Signed)
Medical screening examination/treatment/procedure(s) were performed by non-physician practitioner and as supervising physician I was immediately available for consultation/collaboration.  Darlys Gales, MD 11/22/12 [phone]

## 2012-11-23 ENCOUNTER — Encounter (HOSPITAL_COMMUNITY)
Admission: RE | Admit: 2012-11-23 | Discharge: 2012-11-23 | Disposition: A | Discharge status: Home / Self Care | Payer: PRIVATE HEALTH INSURANCE | Source: Ambulatory Visit | Attending: General Surgery | Admitting: General Surgery

## 2012-11-23 ENCOUNTER — Other Ambulatory Visit (HOSPITAL_COMMUNITY): Payer: Self-pay | Admitting: *Deleted

## 2012-11-23 ENCOUNTER — Encounter (HOSPITAL_COMMUNITY): Payer: Self-pay

## 2012-11-23 LAB — CBC
MCH: 33.8 pg (ref 26.0–34.0)
MCHC: 34 g/dL (ref 30.0–36.0)
Platelets: 189 10*3/uL (ref 150–400)
RBC: 3.94 MIL/uL (ref 3.87–5.11)

## 2012-11-23 LAB — HCG, SERUM, QUALITATIVE: Preg, Serum: NEGATIVE

## 2012-11-23 MED ORDER — CHLORHEXIDINE GLUCONATE 4 % EX LIQD: 1.0000 "application " | Freq: Once | CUTANEOUS | Status: DC

## 2012-11-23 MED ORDER — CEFAZOLIN SODIUM-DEXTROSE 2-3 GM-% IV SOLR
2.0000 g | INTRAVENOUS | Status: AC
  Administered 2012-11-24: 2 g via INTRAVENOUS
  Filled 2012-11-23: qty 50

## 2012-11-23 NOTE — Pre-Procedure Instructions (Signed)
Jamie Jenkins  11/23/2012   Your procedure is scheduled on:  November 24, 2012 at 7:30 AM  Report to [facility] at 5:30 AM.  Call this number if you have problems the morning of surgery: [phone]   Remember:   Do not eat food or drink liquids after midnight.   Take these medicines the morning of surgery with A SIP OF WATER: sulfamethoxazole-trimethoprim (BACTRIM DS) - if able to take it on an empty stomach, oxyCODONE-acetaminophen (PERCOCET/ROXICET) - if needed     Do not wear jewelry, make-up or nail polish.  Do not wear lotions, powders, or perfumes. You may wear deodorant.  Do not shave 48 hours prior to surgery. Men may shave face and neck.  Do not bring valuables to the hospital.  [facility] is not responsible                   for any belongings or valuables.  Contacts, dentures or bridgework may not be worn into surgery.  Leave suitcase in the car. After surgery it may be brought to your room.  For patients admitted to the hospital, checkout time is 11:00 AM the day of  discharge.   Patients discharged the day of surgery will not be allowed to drive  home.  Name and phone number of your driver: Family/friend  Special Instructions: Shower using CHG 2 nights before surgery and the night before surgery.  If you shower the day of surgery use CHG.  Use special wash - you have one bottle of CHG for all showers.  You should use approximately 1/3 of the bottle for each shower.   Please read over the following fact sheets that you were given: Pain Booklet, Coughing and Deep Breathing and Surgical Site Infection Prevention

## 2012-11-24 ENCOUNTER — Encounter (HOSPITAL_COMMUNITY): Payer: Self-pay | Admitting: Anesthesiology

## 2012-11-24 ENCOUNTER — Ambulatory Visit (HOSPITAL_COMMUNITY)
Admission: RE | Admit: 2012-11-24 | Discharge: 2012-11-24 | Disposition: A | Discharge status: Home / Self Care | Payer: PRIVATE HEALTH INSURANCE | Source: Ambulatory Visit | Attending: General Surgery | Admitting: General Surgery

## 2012-11-24 ENCOUNTER — Ambulatory Visit (HOSPITAL_COMMUNITY): Payer: PRIVATE HEALTH INSURANCE | Admitting: Anesthesiology

## 2012-11-24 ENCOUNTER — Encounter (HOSPITAL_COMMUNITY)
Admission: RE | Disposition: A | Discharge status: Home / Self Care | Payer: Self-pay | Source: Ambulatory Visit | Attending: General Surgery

## 2012-11-24 DIAGNOSIS — L02412 Cutaneous abscess of left axilla: Secondary | ICD-10-CM | POA: Diagnosis present

## 2012-11-24 DIAGNOSIS — IMO0002 Reserved for concepts with insufficient information to code with codable children: Secondary | ICD-10-CM | POA: Insufficient documentation

## 2012-11-24 DIAGNOSIS — L723 Sebaceous cyst: Secondary | ICD-10-CM

## 2012-11-24 HISTORY — PX: AXILLARY LYMPH NODE DISSECTION: SHX5229

## 2012-11-24 SURGERY — LYMPHADENECTOMY, AXILLARY
Anesthesia: General | Site: Axilla | Laterality: Left | Wound class: Dirty or Infected

## 2012-11-24 MED ORDER — ONDANSETRON HCL 4 MG/2ML IJ SOLN
INTRAMUSCULAR | Status: DC | PRN
  Administered 2012-11-24: 4 mg via INTRAVENOUS

## 2012-11-24 MED ORDER — FENTANYL CITRATE 0.05 MG/ML IJ SOLN
INTRAMUSCULAR | Status: DC | PRN
  Administered 2012-11-24: 100 ug via INTRAVENOUS
  Administered 2012-11-24: 50 ug via INTRAVENOUS
  Administered 2012-11-24: 100 ug via INTRAVENOUS

## 2012-11-24 MED ORDER — LIDOCAINE HCL (CARDIAC) 20 MG/ML IV SOLN
INTRAVENOUS | Status: DC | PRN
  Administered 2012-11-24: 65 mg via INTRAVENOUS

## 2012-11-24 MED ORDER — MIDAZOLAM HCL 5 MG/5ML IJ SOLN
INTRAMUSCULAR | Status: DC | PRN
  Administered 2012-11-24: 2 mg via INTRAVENOUS

## 2012-11-24 MED ORDER — BUPIVACAINE-EPINEPHRINE (PF) 0.5% -1:200000 IJ SOLN
INTRAMUSCULAR | Status: AC
  Filled 2012-11-24: qty 10

## 2012-11-24 MED ORDER — OXYCODONE HCL 5 MG/5ML PO SOLN: 5.0000 mg | Freq: Once | ORAL | Status: AC | PRN

## 2012-11-24 MED ORDER — LACTATED RINGERS IV SOLN
INTRAVENOUS | Status: DC | PRN
  Administered 2012-11-24: 07:00:00 via INTRAVENOUS

## 2012-11-24 MED ORDER — HYDROMORPHONE HCL PF 1 MG/ML IJ SOLN
INTRAMUSCULAR | Status: AC
  Filled 2012-11-24: qty 1

## 2012-11-24 MED ORDER — OXYCODONE HCL 5 MG PO TABS
ORAL_TABLET | ORAL | Status: AC
  Filled 2012-11-24: qty 1

## 2012-11-24 MED ORDER — OXYCODONE HCL 5 MG PO TABS
5.0000 mg | ORAL_TABLET | Freq: Once | ORAL | Status: AC | PRN
  Administered 2012-11-24: 5 mg via ORAL

## 2012-11-24 MED ORDER — PROPOFOL 10 MG/ML IV BOLUS
INTRAVENOUS | Status: DC | PRN
  Administered 2012-11-24: 50 mg via INTRAVENOUS
  Administered 2012-11-24: 200 mg via INTRAVENOUS

## 2012-11-24 MED ORDER — BUPIVACAINE-EPINEPHRINE PF 0.5-1:200000 % IJ SOLN
INTRAMUSCULAR | Status: DC | PRN
  Administered 2012-11-24: 10 mL

## 2012-11-24 MED ORDER — LIDOCAINE-EPINEPHRINE (PF) 1 %-1:200000 IJ SOLN
INTRAMUSCULAR | Status: AC
  Filled 2012-11-24: qty 10

## 2012-11-24 MED ORDER — ARTIFICIAL TEARS OP OINT
TOPICAL_OINTMENT | OPHTHALMIC | Status: DC | PRN
  Administered 2012-11-24: 1 via OPHTHALMIC

## 2012-11-24 MED ORDER — METOCLOPRAMIDE HCL 5 MG/ML IJ SOLN
INTRAMUSCULAR | Status: DC | PRN
  Administered 2012-11-24: 10 mg via INTRAVENOUS

## 2012-11-24 MED ORDER — METOCLOPRAMIDE HCL 5 MG/ML IJ SOLN: 10.0000 mg | Freq: Once | INTRAMUSCULAR | Status: DC | PRN

## 2012-11-24 MED ORDER — HYDROMORPHONE HCL PF 1 MG/ML IJ SOLN
0.2500 mg | INTRAMUSCULAR | Status: DC | PRN
  Administered 2012-11-24 (×2): via INTRAVENOUS

## 2012-11-24 MED ORDER — 0.9 % SODIUM CHLORIDE (POUR BTL) OPTIME
TOPICAL | Status: DC | PRN
  Administered 2012-11-24: 1000 mL

## 2012-11-24 SURGICAL SUPPLY — 50 items
APPLIER CLIP 9.375 MED OPEN (MISCELLANEOUS) ×2
BENZOIN TINCTURE PRP APPL 2/3 (GAUZE/BANDAGES/DRESSINGS) ×2 IMPLANT
BLADE SURG 10 STRL SS (BLADE) ×2 IMPLANT
BLADE SURG 15 STRL LF DISP TIS (BLADE) ×1 IMPLANT
BLADE SURG 15 STRL SS (BLADE) ×1
BLADE SURG ROTATE 9660 (MISCELLANEOUS) IMPLANT
CANISTER SUCTION 2500CC (MISCELLANEOUS) ×2 IMPLANT
CHLORAPREP W/TINT 26ML (MISCELLANEOUS) ×2 IMPLANT
CLEANER TIP ELECTROSURG 2X2 (MISCELLANEOUS) ×2 IMPLANT
CLIP APPLIE 9.375 MED OPEN (MISCELLANEOUS) ×1 IMPLANT
CLOTH BEACON ORANGE TIMEOUT ST (SAFETY) ×2 IMPLANT
CONT SPEC 4OZ CLIKSEAL STRL BL (MISCELLANEOUS) IMPLANT
COVER SURGICAL LIGHT HANDLE (MISCELLANEOUS) ×2 IMPLANT
DECANTER SPIKE VIAL GLASS SM (MISCELLANEOUS) ×2 IMPLANT
DERMABOND ADVANCED (GAUZE/BANDAGES/DRESSINGS) ×1
DERMABOND ADVANCED .7 DNX12 (GAUZE/BANDAGES/DRESSINGS) ×1 IMPLANT
DRAPE LAPAROTOMY TRNSV 102X78 (DRAPE) ×2 IMPLANT
DRAPE UTILITY 15X26 W/TAPE STR (DRAPE) ×4 IMPLANT
DRSG PAD ABDOMINAL 8X10 ST (GAUZE/BANDAGES/DRESSINGS) ×2 IMPLANT
ELECT REM PT RETURN 9FT ADLT (ELECTROSURGICAL) ×2
ELECTRODE REM PT RTRN 9FT ADLT (ELECTROSURGICAL) ×1 IMPLANT
GAUZE SPONGE 4X4 16PLY XRAY LF (GAUZE/BANDAGES/DRESSINGS) ×2 IMPLANT
GLOVE BIOGEL PI IND STRL 7.0 (GLOVE) ×1 IMPLANT
GLOVE BIOGEL PI INDICATOR 7.0 (GLOVE) ×1
GLOVE EUDERMIC 7 POWDERFREE (GLOVE) ×2 IMPLANT
GLOVE SURG SS PI 7.0 STRL IVOR (GLOVE) ×2 IMPLANT
GOWN STRL NON-REIN LRG LVL3 (GOWN DISPOSABLE) ×2 IMPLANT
GOWN STRL REIN XL XLG (GOWN DISPOSABLE) ×2 IMPLANT
KIT BASIN OR (CUSTOM PROCEDURE TRAY) ×2 IMPLANT
KIT ROOM TURNOVER OR (KITS) ×2 IMPLANT
NEEDLE HYPO 25GX1X1/2 BEV (NEEDLE) IMPLANT
NS IRRIG 1000ML POUR BTL (IV SOLUTION) ×2 IMPLANT
PACK SURGICAL SETUP 50X90 (CUSTOM PROCEDURE TRAY) ×2 IMPLANT
PAD ARMBOARD 7.5X6 YLW CONV (MISCELLANEOUS) ×4 IMPLANT
PENCIL BUTTON HOLSTER BLD 10FT (ELECTRODE) ×2 IMPLANT
SPONGE GAUZE 4X4 12PLY (GAUZE/BANDAGES/DRESSINGS) ×2 IMPLANT
SPONGE LAP 4X18 X RAY DECT (DISPOSABLE) IMPLANT
STRIP CLOSURE SKIN 1/2X4 (GAUZE/BANDAGES/DRESSINGS) ×2 IMPLANT
SUT MNCRL AB 4-0 PS2 18 (SUTURE) ×2 IMPLANT
SUT VIC AB 3-0 SH 27 (SUTURE) ×2
SUT VIC AB 3-0 SH 27XBRD (SUTURE) ×1 IMPLANT
SUT VICRYL AB 3 0 TIES (SUTURE) ×2 IMPLANT
SYR BULB 3OZ (MISCELLANEOUS) ×2 IMPLANT
SYR CONTROL 10ML LL (SYRINGE) IMPLANT
TAPE CLOTH SURG 4X10 WHT LF (GAUZE/BANDAGES/DRESSINGS) ×2 IMPLANT
TOWEL OR 17X24 6PK STRL BLUE (TOWEL DISPOSABLE) ×2 IMPLANT
TOWEL OR 17X26 10 PK STRL BLUE (TOWEL DISPOSABLE) ×2 IMPLANT
TUBE CONNECTING 12X1/4 (SUCTIONS) ×2 IMPLANT
WATER STERILE IRR 1000ML POUR (IV SOLUTION) ×2 IMPLANT
YANKAUER SUCT BULB TIP NO VENT (SUCTIONS) ×2 IMPLANT

## 2012-11-24 NOTE — Op Note (Signed)
Patient Name:           Jamie Jenkins   Date of Surgery:        11/24/2012  Pre op Diagnosis:      Recurrent left axillary abscess, suspect hidradenitis suppurativa  Post op Diagnosis:    Same  Procedure:                 Excisional debridement of skin and subcutaneous tissue, left axilla, 18 cm area  Surgeon:                     Angelia Mould. Derrell Lolling, M.D., FACS  Assistant:                      None  Operative Indications:   Jamie Jenkins is a 52 y.o. female. She is referred by Dr. Darlys Gales in the emergency department for evaluation of recurrent left axillary abscess.  She states that she has had a left axillary abscess 4 times now. Incision and drainage performed in 1989, unknown surgeon. Incision and drainage performed in the emergency department 1999. Incision and drainage performed in emergency department 4 months ago which healed. She now presents with a four-day history of painful swelling left axilla. She was seen in the emergency department earlier this week  and referred to our office. She has a prescription for antibiotics and a prescription for Percocet. She has not had any drainage but it is very painful and she declined any procedure in the office.  She is brought to the operating room for surgical management   Operative Findings:       There was a large area of chronic induration and also an area of acute infection centrally in the left axilla. There was some sebaceous material at the edge of this which was also debrided. To completely excise this area I excised an elliptical area which was about 18 cm square. There was no gross pus and so nothing that could really be cultured.  Procedure in Detail:          Following the induction of a general LMA anesthetic and intravenous antibodies were given and the left axilla was prepped and draped in a sterile fashion. Surgical time out was performed. 0.5% Marcaine with epinephrine was used for local infiltration  anesthetic. I mapped out the area to be excised into a elliptical incision oriented in the skin crease. Using a knife and electrocautery I excised all of this tissue down to soft healthy subcutaneous fat. Hemostasis was very good and was achieved with electrocautery. Wound irrigated with saline and packed with saline moistened gauze. Dry bandage on top. Tolerated well. Taken to recovery room stable. EBL 15 cc. Counts correct. Complications none.     Angelia Mould. Derrell Lolling, M.D., FACS General and Minimally Invasive Surgery Breast and Colorectal Surgery  11/24/2012 8:09 AM

## 2012-11-24 NOTE — Interval H&P Note (Signed)
History and Physical Interval Note:  11/24/2012 6:58 AM  Jamie Jenkins  has presented today for surgery, with the diagnosis of left axillary abscess  The various methods of treatment have been discussed with the patient and family. After consideration of risks, benefits and other options for treatment, the patient has consented to  Procedure(s): incision and drainage axillary abscess (Left) as a surgical intervention .  The patient's history has been reviewed, patient examined today , no change in status, stable for surgery.  I have reviewed the patient's chart and labs.  Questions were answered to the patient's satisfaction.     Ernestene Mention

## 2012-11-24 NOTE — Anesthesia Postprocedure Evaluation (Signed)
Anesthesia Post Note  Patient: Jamie Jenkins  Procedure(s) Performed: Procedure(s) (LRB): incision and drainage axillary abscess (Left)  Anesthesia type: General  Patient location: PACU  Post pain: Pain level controlled  Post assessment: Patient's Cardiovascular Status Stable  Last Vitals:  Filed Vitals:   11/24/12 0915  BP: 149/72  Pulse: 58  Temp:   Resp: 16    Post vital signs: Reviewed and stable  Level of consciousness: alert  Complications: No apparent anesthesia complications

## 2012-11-24 NOTE — Anesthesia Preprocedure Evaluation (Signed)
Anesthesia Evaluation  Patient identified by MRN, date of birth, ID band Patient awake    Reviewed: Allergy & Precautions, H&P , NPO status , Patient's Chart, lab work & pertinent test results, reviewed documented beta blocker date and time   Airway Mallampati: II TM Distance: >3 FB Neck ROM: full    Dental   Pulmonary neg pulmonary ROS,  breath sounds clear to auscultation        Cardiovascular negative cardio ROS  Rhythm:regular     Neuro/Psych negative neurological ROS  negative psych ROS   GI/Hepatic negative GI ROS, Neg liver ROS,   Endo/Other  negative endocrine ROS  Renal/GU negative Renal ROS  negative genitourinary   Musculoskeletal   Abdominal   Peds  Hematology negative hematology ROS (+)   Anesthesia Other Findings See surgeon's H&P   Reproductive/Obstetrics negative OB ROS                           Anesthesia Physical Anesthesia Plan  ASA: II  Anesthesia Plan: General   Post-op Pain Management:    Induction: Intravenous  Airway Management Planned: LMA  Additional Equipment:   Intra-op Plan:   Post-operative Plan:   Informed Consent: I have reviewed the patients History and Physical, chart, labs and discussed the procedure including the risks, benefits and alternatives for the proposed anesthesia with the patient or authorized representative who has indicated his/her understanding and acceptance.   Dental Advisory Given  Plan Discussed with: CRNA and Surgeon  Anesthesia Plan Comments:         Anesthesia Quick Evaluation  

## 2012-11-24 NOTE — Preoperative (Signed)
Beta Blockers   Reason not to administer Beta Blockers:Not Applicable 

## 2012-11-24 NOTE — Transfer of Care (Signed)
Immediate Anesthesia Transfer of Care Note  Patient: Jamie Jenkins  Procedure(s) Performed: Procedure(s): incision and drainage axillary abscess (Left)  Patient Location: PACU  Anesthesia Type:General  Level of Consciousness: awake, alert , oriented and patient cooperative  Airway & Oxygen Therapy: Patient Spontanous Breathing  Post-op Assessment: Report given to PACU RN, Post -op Vital signs reviewed and stable and Patient moving all extremities X 4  Post vital signs: Reviewed and stable  Complications: No apparent anesthesia complications

## 2012-11-25 ENCOUNTER — Encounter (HOSPITAL_COMMUNITY): Payer: Self-pay | Admitting: General Surgery

## 2012-11-28 ENCOUNTER — Encounter (INDEPENDENT_AMBULATORY_CARE_PROVIDER_SITE_OTHER): Payer: Self-pay | Admitting: General Surgery

## 2012-11-28 ENCOUNTER — Ambulatory Visit (INDEPENDENT_AMBULATORY_CARE_PROVIDER_SITE_OTHER): Payer: PRIVATE HEALTH INSURANCE | Admitting: General Surgery

## 2012-11-28 VITALS — BP 140/88 | HR 60 | Temp 97.0°F | Resp 12 | Ht 66.0 in | Wt 206.0 lb

## 2012-11-28 DIAGNOSIS — L02412 Cutaneous abscess of left axilla: Secondary | ICD-10-CM

## 2012-11-28 DIAGNOSIS — IMO0002 Reserved for concepts with insufficient information to code with codable children: Secondary | ICD-10-CM

## 2012-11-28 NOTE — Patient Instructions (Signed)
The open wound in your left axilla looks good. There is no more infection.  Stretch your left shoulder as much as possible several times a day.  Continue to take a shower twice a day, and after each shower pack the wound with the gauze, just as you have been doing  You have been given a prescription for Percocet today  Return to see Dr. Derrell Lolling in 3 weeks.

## 2012-11-28 NOTE — Progress Notes (Signed)
Patient ID: Jamie Jenkins, female   DOB: 1960/08/02, 52 y.o.   MRN: [id number] History: This patient underwent excisional debridement of skin and subcutaneous tissue of the left axilla on 11/24/2012 because of a multiply recurrent left axillary abscess. Final pathology shows chronic inflammation. There was no gross abscess cavity and so no cultures were done. She says that her husband is changing the bandage twice a day. It is still sore and she would like another prescription for pain medication. No bleeding.  Exam: Patient is alert. Cooperative. Very anxious, tearful, hyperventilating. We were able to change the bandage. It is being packed properly. It is clean at the base. Minimal granulation tissue.Skin is healthy. No cellulitis or induration. Repacked.  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  wound is stable following excisional debridement  Plan: Continue twice a day wound care with shower and saline fine mesh gauze packing Range of motion left shoulder exercises discussed and urged. Prescription for Percocet 7.5-325, #30 tablets, given to patient Return to see me in 3 weeks.    Angelia Mould. Derrell Lolling, M.D., [facility] General and Minimally invasive Surgery Breast and Colorectal Surgery Office:   [phone] Pager:   [phone]

## 2012-12-13 ENCOUNTER — Ambulatory Visit (INDEPENDENT_AMBULATORY_CARE_PROVIDER_SITE_OTHER): Payer: PRIVATE HEALTH INSURANCE | Admitting: General Surgery

## 2012-12-13 ENCOUNTER — Encounter (INDEPENDENT_AMBULATORY_CARE_PROVIDER_SITE_OTHER): Payer: Self-pay | Admitting: General Surgery

## 2012-12-13 VITALS — BP 110/82 | HR 78 | Temp 97.6°F | Ht 66.0 in | Wt 204.0 lb

## 2012-12-13 DIAGNOSIS — IMO0002 Reserved for concepts with insufficient information to code with codable children: Secondary | ICD-10-CM

## 2012-12-13 DIAGNOSIS — L02412 Cutaneous abscess of left axilla: Secondary | ICD-10-CM

## 2012-12-13 NOTE — Patient Instructions (Signed)
Your left axillary wound looks clean.  Continue to take a shower twice a day and let the shower water hit this for 5 minutes  Change the bandage with fine mesh gauze twice a day  This wound should heal completely in one month. If it does not heal, return to see Dr. Derrell Lolling.

## 2012-12-13 NOTE — Progress Notes (Signed)
Patient ID: Jamie Jenkins, female   DOB: 15-Feb-1961, 52 y.o.   MRN: [id number] History: This patient underwent excisional debridement of skin and subcutaneous tissue of the left axilla on 11/24/2012 because of a multiply recurrent left axillary abscess. Final pathology was benign. She says the wound is getting smaller and is less painful. She is resting at.  Exam: This patient is alert and cooperative. Less anxious today We will change the bandage. And healthy granulation tissue. Smaller. Perhaps 3 cm x 2 cm in size. More shallow female. No cellulitis. 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 Wound is stable and healing by secondary intention  Plan:   continue twice a day wound care with shower and saline/ fine-mesh gauze packing Return to see me if this does not heal completely in one month.   Angelia Mould. Derrell Lolling, M.D., [facility] General and Minimally invasive Surgery Breast and Colorectal Surgery Office:   [phone] Pager:   [phone]

## 2013-12-04 ENCOUNTER — Emergency Department (HOSPITAL_COMMUNITY)
Admission: EM | Admit: 2013-12-04 | Discharge: 2013-12-04 | Disposition: A | Discharge status: Home / Self Care | Payer: PRIVATE HEALTH INSURANCE | Attending: Emergency Medicine | Admitting: Emergency Medicine

## 2013-12-04 ENCOUNTER — Emergency Department (HOSPITAL_COMMUNITY): Payer: PRIVATE HEALTH INSURANCE

## 2013-12-04 ENCOUNTER — Encounter (HOSPITAL_COMMUNITY): Payer: Self-pay | Admitting: Emergency Medicine

## 2013-12-04 DIAGNOSIS — Z862 Personal history of diseases of the blood and blood-forming organs and certain disorders involving the immune mechanism: Secondary | ICD-10-CM | POA: Insufficient documentation

## 2013-12-04 DIAGNOSIS — M5137 Other intervertebral disc degeneration, lumbosacral region: Secondary | ICD-10-CM | POA: Diagnosis not present

## 2013-12-04 DIAGNOSIS — M79604 Pain in right leg: Secondary | ICD-10-CM

## 2013-12-04 DIAGNOSIS — Z8639 Personal history of other endocrine, nutritional and metabolic disease: Secondary | ICD-10-CM | POA: Diagnosis not present

## 2013-12-04 DIAGNOSIS — IMO0002 Reserved for concepts with insufficient information to code with codable children: Secondary | ICD-10-CM | POA: Insufficient documentation

## 2013-12-04 DIAGNOSIS — M5136 Other intervertebral disc degeneration, lumbar region: Secondary | ICD-10-CM

## 2013-12-04 DIAGNOSIS — IMO0001 Reserved for inherently not codable concepts without codable children: Secondary | ICD-10-CM | POA: Insufficient documentation

## 2013-12-04 DIAGNOSIS — M51379 Other intervertebral disc degeneration, lumbosacral region without mention of lumbar back pain or lower extremity pain: Secondary | ICD-10-CM | POA: Insufficient documentation

## 2013-12-04 DIAGNOSIS — M5416 Radiculopathy, lumbar region: Secondary | ICD-10-CM

## 2013-12-04 DIAGNOSIS — M79609 Pain in unspecified limb: Secondary | ICD-10-CM | POA: Insufficient documentation

## 2013-12-04 MED ORDER — HYDROCODONE-ACETAMINOPHEN 5-325 MG PO TABS
2.0000 | ORAL_TABLET | Freq: Once | ORAL | Status: AC
  Administered 2013-12-04: 2 via ORAL
  Filled 2013-12-04: qty 2

## 2013-12-04 MED ORDER — HYDROCODONE-ACETAMINOPHEN 5-325 MG PO TABS: 1.0000 | ORAL_TABLET | ORAL | Status: DC | PRN

## 2013-12-04 NOTE — ED Notes (Addendum)
Pt c/o right leg pain x 1 week, states knot she noticed this morning. States the swelling in leg has been going off and on for about 1 week as well. Pt states pain is worse at night when laying down

## 2013-12-04 NOTE — ED Notes (Signed)
Patient transported to X-ray 

## 2013-12-04 NOTE — ED Notes (Signed)
Venous Duplex at bedside

## 2013-12-04 NOTE — Progress Notes (Signed)
*  PRELIMINARY RESULTS* Vascular Ultrasound Right lower extremity venous duplex has been completed.  Preliminary findings: no evidence of DVT  Landry Mellow, RDMS, RVT  12/04/2013, 6:57 PM

## 2013-12-04 NOTE — Discharge Instructions (Signed)
Read the information below.  Use the prescribed medication as directed.  Please discuss all new medications with your pharmacist.  Do not take additional tylenol while taking the prescribed pain medication to avoid overdose.  You may return to the Emergency Department at any time for worsening condition or any new symptoms that concern you.   If you develop fevers, loss of control of bowel or bladder, weakness or numbness in your legs, or are unable to walk, return to the ER for a recheck.    Degenerative Disk Disease Degenerative disk disease is a condition caused by the changes that occur in the cushions of the backbone (spinal disks) as you grow older. Spinal disks are soft and compressible disks located between the bones of the spine (vertebrae). They act like shock absorbers. Degenerative disk disease can affect the whole spine. However, the neck and lower back are most commonly affected. Many changes can occur in the spinal disks with aging, such as:  The spinal disks may dry and shrink.  Small tears may occur in the tough, outer covering of the disk (annulus).  The disk space may become smaller due to loss of water.  Abnormal growths in the bone (spurs) may occur. This can put pressure on the nerve roots exiting the spinal canal, causing pain.  The spinal canal may become narrowed. CAUSES  Degenerative disk disease is a condition caused by the changes that occur in the spinal disks with aging. The exact cause is not known, but there is a genetic basis for many patients. Degenerative changes can occur due to loss of fluid in the disk. This makes the disk thinner and reduces the space between the backbones. Small cracks can develop in the outer layer of the disk. This can lead to the breakdown of the disk. You are more likely to get degenerative disk disease if you are overweight. Smoking cigarettes and doing heavy work such as weightlifting can also increase your risk of this condition.  Degenerative changes can start after a sudden injury. Growth of bone spurs can compress the nerve roots and cause pain.  SYMPTOMS  The symptoms vary from person to person. Some people may have no pain, while others have severe pain. The pain may be so severe that it can limit your activities. The location of the pain depends on the part of your backbone that is affected. You will have neck or arm pain if a disk in the neck area is affected. You will have pain in your back, buttocks, or legs if a disk in the lower back is affected. The pain becomes worse while bending, reaching up, or with twisting movements. The pain may start gradually and then get worse as time passes. It may also start after a major or minor injury. You may feel numbness or tingling in the arms or legs.  DIAGNOSIS  Your caregiver will ask you about your symptoms and about activities or habits that may cause the pain. He or she may also ask about any injuries, diseases, or treatments you have had earlier. Your caregiver will examine you to check for the range of movement that is possible in the affected area, to check for strength in your extremities, and to check for sensation in the areas of the arms and legs supplied by different nerve roots. An X-ray of the spine may be taken. Your caregiver may suggest other imaging tests, such as magnetic resonance imaging (MRI), if needed.  TREATMENT  Treatment includes rest, modifying  your activities, and applying ice and heat. Your caregiver may prescribe medicines to reduce your pain and may ask you to do some exercises to strengthen your back. In some cases, you may need surgery. You and your caregiver will decide on the treatment that is best for you. HOME CARE INSTRUCTIONS   Follow proper lifting and walking techniques as advised by your caregiver.  Maintain good posture.  Exercise regularly as advised.  Perform relaxation exercises.  Change your sitting, standing, and sleeping habits  as advised. Change positions frequently.  Lose weight as advised.  Stop smoking if you smoke.  Wear supportive footwear. SEEK MEDICAL CARE IF:  Your pain does not go away within 1 to 4 weeks. SEEK IMMEDIATE MEDICAL CARE IF:   Your pain is severe.  You notice weakness in your arms, hands, or legs.  You begin to lose control of your bladder or bowel movements. MAKE SURE YOU:   Understand these instructions.  Will watch your condition.  Will get help right away if you are not doing well or get worse. Document Released: 12/28/2006 Document Revised: 05/25/2011 Document Reviewed: 07/04/2013 [facility] Patient Information 2015 [place], [place]. This information is not intended to replace advice given to you by your health care provider. Make sure you discuss any questions you have with your health care provider.  Radicular Pain Radicular pain in either the arm or leg is usually from a bulging or herniated disk in the spine. A piece of the herniated disk may press against the nerves as the nerves exit the spine. This causes pain which is felt at the tips of the nerves down the arm or leg. Other causes of radicular pain may include:  Fractures.  Heart disease.  Cancer.  An abnormal and usually degenerative state of the nervous system or nerves (neuropathy). Diagnosis may require CT or MRI scanning to determine the primary cause.  Nerves that start at the neck (nerve roots) may cause radicular pain in the outer shoulder and arm. It can spread down to the thumb and fingers. The symptoms vary depending on which nerve root has been affected. In most cases radicular pain improves with conservative treatment. Neck problems may require physical therapy, a neck collar, or cervical traction. Treatment may take many weeks, and surgery may be considered if the symptoms do not improve.  Conservative treatment is also recommended for sciatica. Sciatica causes pain to radiate from the lower back or  buttock area down the leg into the foot. Often there is a history of back problems. Most patients with sciatica are better after 2 to 4 weeks of rest and other supportive care. Short term bed rest can reduce the disk pressure considerably. Sitting, however, is not a good position since this increases the pressure on the disk. You should avoid bending, lifting, and all other activities which make the problem worse. Traction can be used in severe cases. Surgery is usually reserved for patients who do not improve within the first months of treatment. Only take over-the-counter or prescription medicines for pain, discomfort, or fever as directed by your caregiver. Narcotics and muscle relaxants may help by relieving more severe pain and spasm and by providing mild sedation. Cold or massage can give significant relief. Spinal manipulation is not recommended. It can increase the degree of disc protrusion. Epidural steroid injections are often effective treatment for radicular pain. These injections deliver medicine to the spinal nerve in the space between the protective covering of the spinal cord and back bones (  vertebrae). Your caregiver can give you more information about steroid injections. These injections are most effective when given within two weeks of the onset of pain.  You should see your caregiver for follow up care as recommended. A program for neck and back injury rehabilitation with stretching and strengthening exercises is an important part of management.  SEEK IMMEDIATE MEDICAL CARE IF:  You develop increased pain, weakness, or numbness in your arm or leg.  You develop difficulty with bladder or bowel control.  You develop abdominal pain. Document Released: 04/09/2004 Document Revised: 05/25/2011 Document Reviewed: 06/25/2008 [facility] Patient Information 2015 [place], [place]. This information is not intended to replace advice given to you by your health care provider. Make sure you discuss any  questions you have with your health care provider.

## 2013-12-04 NOTE — ED Provider Notes (Signed)
CSN: [id number]     Arrival date & time 12/04/13  1148 History   First MD Initiated Contact with Patient 12/04/13 1803     Chief Complaint  Patient presents with  . Leg Pain    right     (Consider location/radiation/quality/duration/timing/severity/associated sxs/prior Treatment) The history is provided by the patient.    Patient reports pain in her right lower extremity that has been intermittent x 1 week.  Pain is worse at night and with lying flat.  Pain is 7/10 intensity, throbbing/aching.  Denies fever, chills, CP, SOB, cough, weakness or numbness of the extremities.  Denies heavy lifting, denies falls or trauma recently.  States has hx "muscle spasms" in her back but otherwise no back problems.  Brother had hx DVT, unknown reason.  Pt has no known medical problems.    Past Medical History  Diagnosis Date  . Gall stones    Past Surgical History  Procedure Laterality Date  . Cholecystectomy    . Tubal ligation    . Breast surgery  2012    breast biopsy  . Axillary lymph node dissection Left 11/24/2012    Procedure: incision and drainage axillary abscess;  Surgeon: Adin Hector, MD;  Location: [place];  Service: General;  Laterality: Left;   No family history on file. History  Substance Use Topics  . Smoking status: Never Smoker   . Smokeless tobacco: Never Used  . Alcohol Use: No   OB History   Grav Para Term Preterm Abortions TAB SAB Ect Mult Living                 Review of Systems  All other systems reviewed and are negative.     Allergies  Review of patient's allergies indicates no known allergies.  Home Medications   Prior to Admission medications   Not on File   BP 177/97  Pulse 58  Temp(Src) 97.6 F (36.4 C) (Oral)  Resp 14  SpO2 100%  LMP 08/23/2012 Physical Exam  Nursing note and vitals reviewed. Constitutional: She appears well-developed and well-nourished. No distress.  HENT:  Head: Normocephalic and atraumatic.  Neck: Neck supple.    Cardiovascular: Normal rate, regular rhythm and intact distal pulses.   Pulmonary/Chest: Effort normal and breath sounds normal. No respiratory distress. She has no wheezes. She has no rales.  Abdominal: Soft. She exhibits no distension. There is no tenderness. There is no rebound and no guarding.  Musculoskeletal: She exhibits no edema.       Back:       Legs: Lower extremities:  Strength 5/5, sensation intact, distal pulses intact.     Neurological: She is alert.  Skin: She is not diaphoretic. No erythema.    ED Course  Procedures (including critical care time) Labs Review Labs Reviewed - No data to display  Imaging Review Dg Lumbar Spine Complete  12/04/2013   CLINICAL DATA:  Low back pain radiating down the right leg.  EXAM: LUMBAR SPINE - COMPLETE 4+ VIEW  COMPARISON:  No priors.  FINDINGS: Five views of the lumbar spine demonstrate no acute displaced fractures or compression type fractures. Alignment is anatomic. Multilevel degenerative disc disease, most severe at L5-S1. Multilevel facet arthropathy, most severe at L4-L5 and L5-S1. No defects of the pars interarticularis.  IMPRESSION: 1. No acute radiographic abnormality of the lumbar spine. 2. 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   Electronically Signed   By: Vinnie Langton M.D.   On: 12/04/2013  19:28     EKG Interpretation None      DVT study is negative.   MDM   Final diagnoses:  Degenerative disc disease, lumbar  Lumbar radiculopathy  Right leg pain    Afebrile, nontoxic patient with right lower extremity pain.  The pain seems to be radicular.  DVT study negative.  No e/o infection.  Has DDD on xray, severe at L5-s1.  No weakness on exam.   D/C home with norco, neurosurgery follow up.  Discussed result, findings, treatment, and follow up  with patient.  Pt given return precautions.  Pt verbalizes understanding and agrees with plan.         [place], PA-C 12/05/13 0041

## 2013-12-06 NOTE — ED Provider Notes (Signed)
Medical screening examination/treatment/procedure(s) were performed by non-physician practitioner and as supervising physician I was immediately available for consultation/collaboration.   EKG Interpretation None        Francine Graven, DO 12/06/13 1749

## 2014-02-26 ENCOUNTER — Emergency Department (HOSPITAL_COMMUNITY): Payer: PRIVATE HEALTH INSURANCE

## 2014-02-26 ENCOUNTER — Encounter (HOSPITAL_COMMUNITY): Payer: Self-pay | Admitting: Emergency Medicine

## 2014-02-26 ENCOUNTER — Emergency Department (HOSPITAL_COMMUNITY)
Admission: EM | Admit: 2014-02-26 | Discharge: 2014-02-26 | Disposition: A | Discharge status: Home / Self Care | Payer: PRIVATE HEALTH INSURANCE | Attending: Emergency Medicine | Admitting: Emergency Medicine

## 2014-02-26 ENCOUNTER — Emergency Department (INDEPENDENT_AMBULATORY_CARE_PROVIDER_SITE_OTHER)
Admission: EM | Admit: 2014-02-26 | Discharge: 2014-02-26 | Disposition: A | Discharge status: Nursing Home (Includes ICF) | Payer: PRIVATE HEALTH INSURANCE | Source: Home / Self Care

## 2014-02-26 DIAGNOSIS — R51 Headache: Secondary | ICD-10-CM

## 2014-02-26 DIAGNOSIS — Z8719 Personal history of other diseases of the digestive system: Secondary | ICD-10-CM | POA: Diagnosis not present

## 2014-02-26 DIAGNOSIS — R110 Nausea: Secondary | ICD-10-CM | POA: Diagnosis not present

## 2014-02-26 DIAGNOSIS — I10 Essential (primary) hypertension: Secondary | ICD-10-CM | POA: Diagnosis present

## 2014-02-26 DIAGNOSIS — Z791 Long term (current) use of non-steroidal anti-inflammatories (NSAID): Secondary | ICD-10-CM | POA: Diagnosis not present

## 2014-02-26 DIAGNOSIS — R519 Headache, unspecified: Secondary | ICD-10-CM

## 2014-02-26 DIAGNOSIS — H539 Unspecified visual disturbance: Secondary | ICD-10-CM | POA: Insufficient documentation

## 2014-02-26 LAB — BASIC METABOLIC PANEL
Anion gap: 14 (ref 5–15)
BUN: 14 mg/dL (ref 6–23)
CALCIUM: 9.3 mg/dL (ref 8.4–10.5)
CO2: 22 mEq/L (ref 19–32)
Chloride: 105 mEq/L (ref 96–112)
Creatinine, Ser: 0.96 mg/dL (ref 0.50–1.10)
GFR calc Af Amer: 77 mL/min — ABNORMAL LOW (ref >90)
GFR, EST NON AFRICAN AMERICAN: 66 mL/min — AB (ref >90)
GLUCOSE: 96 mg/dL (ref 70–99)
Potassium: 4.1 mEq/L (ref 3.7–5.3)
SODIUM: 141 meq/L (ref 137–147)

## 2014-02-26 MED ORDER — METOCLOPRAMIDE HCL 5 MG/ML IJ SOLN
10.0000 mg | Freq: Once | INTRAMUSCULAR | Status: AC
  Administered 2014-02-26: 10 mg via INTRAVENOUS
  Filled 2014-02-26: qty 2

## 2014-02-26 MED ORDER — DIPHENHYDRAMINE HCL 50 MG/ML IJ SOLN
12.5000 mg | Freq: Once | INTRAMUSCULAR | Status: AC
  Administered 2014-02-26: 12.5 mg via INTRAVENOUS
  Filled 2014-02-26: qty 1

## 2014-02-26 MED ORDER — KETOROLAC TROMETHAMINE 30 MG/ML IJ SOLN
30.0000 mg | Freq: Once | INTRAMUSCULAR | Status: AC
  Administered 2014-02-26: 30 mg via INTRAVENOUS
  Filled 2014-02-26: qty 1

## 2014-02-26 MED ORDER — HYDROCHLOROTHIAZIDE 25 MG PO TABS
12.5000 mg | ORAL_TABLET | Freq: Once | ORAL | Status: AC
  Administered 2014-02-26: 12.5 mg via ORAL
  Filled 2014-02-26: qty 1

## 2014-02-26 MED ORDER — HYDROCHLOROTHIAZIDE 25 MG PO TABS: 12.5000 mg | ORAL_TABLET | Freq: Every day | ORAL | Status: DC

## 2014-02-26 MED ORDER — DEXAMETHASONE SODIUM PHOSPHATE 10 MG/ML IJ SOLN
10.0000 mg | Freq: Once | INTRAMUSCULAR | Status: AC
  Administered 2014-02-26: 10 mg via INTRAVENOUS
  Filled 2014-02-26: qty 1

## 2014-02-26 NOTE — ED Notes (Signed)
Reports headache that started Saturday.  Temporary vision blurry or a film over eyes yesterday am.  Today no vision changes, pain in left side of head.  Denies any other pain.  Reports pain constant since saturday

## 2014-02-26 NOTE — Discharge Instructions (Signed)

## 2014-02-26 NOTE — ED Provider Notes (Signed)
CSN: [id number]     Arrival date & time 02/26/14  1610 History   None    Chief Complaint  Patient presents with  . Headache   (Consider location/radiation/quality/duration/timing/severity/associated sxs/prior Treatment) Patient is a 53 y.o. female presenting with headaches. The history is provided by the patient and the spouse.  Headache Pain location:  Occipital Onset quality:  Sudden Duration:  3 days Progression:  Unchanged Chronicity:  New Similar to prior headaches: no   Relieved by:  None tried Worsened by:  Nothing tried Ineffective treatments:  None tried Associated symptoms: blurred vision, nausea and neck pain   Associated symptoms: no abdominal pain, no dizziness, no fever, no numbness, no paresthesias, no photophobia and no vomiting     Past Medical History  Diagnosis Date  . Gall stones    Past Surgical History  Procedure Laterality Date  . Cholecystectomy    . Tubal ligation    . Breast surgery  2012    breast biopsy  . Axillary lymph node dissection Left 11/24/2012    Procedure: incision and drainage axillary abscess;  Surgeon: Adin Hector, MD;  Location: [place];  Service: General;  Laterality: Left;   No family history on file. History  Substance Use Topics  . Smoking status: Never Smoker   . Smokeless tobacco: Never Used  . Alcohol Use: No   OB History    No data available     Review of Systems  Constitutional: Negative.  Negative for fever.  Eyes: Positive for blurred vision. Negative for photophobia.  Gastrointestinal: Positive for nausea. Negative for vomiting and abdominal pain.  Musculoskeletal: Positive for neck pain.  Neurological: Positive for headaches. Negative for dizziness, speech difficulty, numbness and paresthesias.    Allergies  Review of patient's allergies indicates no known allergies.  Home Medications   Prior to Admission medications   Medication Sig Start Date End Date Taking? Authorizing Provider  aspirin EC 325  MG tablet Take 325 mg by mouth daily.   Yes Historical Provider, MD  HYDROcodone-acetaminophen (NORCO/VICODIN) 5-325 MG per tablet Take 1-2 tablets by mouth every 4 (four) hours as needed for moderate pain or severe pain. 12/04/13   Clayton Bibles, PA-C   BP 182/104 mmHg  Pulse 62  Temp(Src) 98 F (36.7 C) (Oral)  Resp 16  SpO2 98%  LMP 08/23/2012 Physical Exam  Constitutional: She is oriented to person, place, and time. She appears well-developed and well-nourished. She appears distressed.  HENT:  Head: Normocephalic.  Right Ear: External ear normal.  Left Ear: External ear normal.  Mouth/Throat: Oropharynx is clear and moist.  Eyes: Conjunctivae and EOM are normal. Pupils are equal, round, and reactive to light.  Neck: Phonation normal. Muscular tenderness present. Decreased range of motion present.  Cardiovascular: Regular rhythm, normal heart sounds and intact distal pulses.   Pulmonary/Chest: Breath sounds normal.  Neurological: She is alert and oriented to person, place, and time. No cranial nerve deficit. Coordination normal.  Skin: Skin is warm and dry.  Nursing note and vitals reviewed.   ED Course  Procedures (including critical care time) Labs Review Labs Reviewed - No data to display  Imaging Review No results found.   MDM   1. Essential hypertension   2. Headache disorder    Sent for headache , hypertension eval, new onset, assoc nausea and visual changes.    Billy Fischer, MD 02/26/14 [phone]

## 2014-02-26 NOTE — ED Notes (Addendum)
Pt to ED with c/o headache and HTN x's 2 days.  Pt st's she does not have hx of HTN.  Also st's yesterday she was having blurry vision.  Pt was sent to ED from urgent care

## 2014-02-26 NOTE — ED Notes (Signed)
Pt made aware to return if symptoms worsen or if any life threatening symptoms occur.   

## 2014-02-26 NOTE — ED Provider Notes (Signed)
CSN: [id number]     Arrival date & time 02/26/14  1743 History   First MD Initiated Contact with Patient 02/26/14 2019     Chief Complaint  Patient presents with  . Hypertension     (Consider location/radiation/quality/duration/timing/severity/associated sxs/prior Treatment) Patient is a 53 y.o. female presenting with hypertension. The history is provided by the patient. No language interpreter was used.  Hypertension This is a new problem. The current episode started in the past 7 days. The problem occurs constantly. The problem has been unchanged. Associated symptoms include headaches and nausea. Pertinent negatives include no abdominal pain, chest pain, chills, congestion, fever, myalgias, rash, vomiting or weakness. Associated symptoms comments: Left sided headache for the past 2 days. She has experienced nausea without vomiting and bilateral blurry vision (currently resolved). She denies a history of recurrent headaches or migraines. She was seen at the Urgent Care prior to arrival in the emergency department and was found to by hypertensive. No previous history. No fever, SOB, CP, lateralizing weakness, syncope or near syncope..    Past Medical History  Diagnosis Date  . Gall stones    Past Surgical History  Procedure Laterality Date  . Cholecystectomy    . Tubal ligation    . Breast surgery  2012    breast biopsy  . Axillary lymph node dissection Left 11/24/2012    Procedure: incision and drainage axillary abscess;  Surgeon: Adin Hector, MD;  Location: [facility];  Service: General;  Laterality: Left;   No family history on file. History  Substance Use Topics  . Smoking status: Never Smoker   . Smokeless tobacco: Never Used  . Alcohol Use: No   OB History    No data available     Review of Systems  Constitutional: Negative for fever and chills.  HENT: Negative for congestion and sinus pressure.   Eyes: Positive for visual disturbance.  Respiratory: Negative.   Negative for shortness of breath.   Cardiovascular: Negative.  Negative for chest pain and leg swelling.  Gastrointestinal: Positive for nausea. Negative for vomiting and abdominal pain.  Musculoskeletal: Negative.  Negative for myalgias and neck stiffness.  Skin: Negative.  Negative for rash.  Neurological: Positive for headaches. Negative for syncope, facial asymmetry, speech difficulty and weakness.  Psychiatric/Behavioral: Negative for confusion.      Allergies  Review of patient's allergies indicates no known allergies.  Home Medications   Prior to Admission medications   Medication Sig Start Date End Date Taking? Authorizing Provider  ibuprofen (ADVIL,MOTRIN) 200 MG tablet Take 400 mg by mouth every 6 (six) hours as needed for headache.   Yes Historical Provider, MD  HYDROcodone-acetaminophen (NORCO/VICODIN) 5-325 MG per tablet Take 1-2 tablets by mouth every 4 (four) hours as needed for moderate pain or severe pain. Patient not taking: Reported on 02/26/2014 12/04/13   Clayton Bibles, PA-C   BP 200/98 mmHg  Pulse 66  Temp(Src) 98.1 F (36.7 C) (Oral)  Resp 18  Ht 5\' 6"  (1.676 m)  Wt 204 lb (92.534 kg)  BMI 32.94 kg/m2  SpO2 95%  LMP 08/23/2012 Physical Exam  Constitutional: She is oriented to person, place, and time. She appears well-developed and well-nourished.  HENT:  Head: Normocephalic.  Neck: Normal range of motion. Neck supple.  Cardiovascular: Normal rate and regular rhythm.   Pulmonary/Chest: Effort normal and breath sounds normal.  Abdominal: Soft. Bowel sounds are normal. There is no tenderness. There is no rebound and no guarding.  Musculoskeletal: Normal range of motion.  Neurological: She is alert and oriented to person, place, and time.  CN's 3-12 grossly intact. Normal coordination without deficits. Ambulatory without ataxia. Speech clear and focused.  Skin: Skin is warm and dry. No rash noted.  Psychiatric: She has a normal mood and affect.    ED  Course  Procedures (including critical care time) Labs Review Labs Reviewed  BASIC METABOLIC PANEL   Results for orders placed or performed during the hospital encounter of 83/66/29  Basic metabolic panel  Result Value Ref Range   Sodium 141 137 - 147 mEq/L   Potassium 4.1 3.7 - 5.3 mEq/L   Chloride 105 96 - 112 mEq/L   CO2 22 19 - 32 mEq/L   Glucose, Bld 96 70 - 99 mg/dL   BUN 14 6 - 23 mg/dL   Creatinine, Ser 0.96 0.50 - 1.10 mg/dL   Calcium 9.3 8.4 - 10.5 mg/dL   GFR calc non Af Amer 66 (L) >90 mL/min   GFR calc Af Amer 77 (L) >90 mL/min   Anion gap 14 5 - 15    Imaging Review No results found.   EKG Interpretation None      MDM   Final diagnoses:  Headache    1. Headache 2. Hypertension   Headache pain is better after medications. No neurologic deficits on exam and normal head CT. Doubt acute neurologic event.  Blood pressure improved but remains elevated. Discussed with Dr. Aline Brochure. Patient is felt appropriate for discharge home on low dose HCTZ and PCP recheck.   Dewaine Oats, PA-C 02/28/14 0018  Pamella Pert, MD 02/28/14 [id number]

## 2014-02-27 ENCOUNTER — Emergency Department (HOSPITAL_COMMUNITY)
Admission: EM | Admit: 2014-02-27 | Discharge: 2014-02-27 | Disposition: A | Discharge status: Home / Self Care | Payer: PRIVATE HEALTH INSURANCE | Attending: Emergency Medicine | Admitting: Emergency Medicine

## 2014-02-27 ENCOUNTER — Encounter (HOSPITAL_COMMUNITY): Payer: Self-pay | Admitting: Physical Medicine and Rehabilitation

## 2014-02-27 DIAGNOSIS — R51 Headache: Secondary | ICD-10-CM | POA: Diagnosis present

## 2014-02-27 DIAGNOSIS — Z79899 Other long term (current) drug therapy: Secondary | ICD-10-CM | POA: Diagnosis not present

## 2014-02-27 DIAGNOSIS — Z8719 Personal history of other diseases of the digestive system: Secondary | ICD-10-CM | POA: Diagnosis not present

## 2014-02-27 DIAGNOSIS — Z8739 Personal history of other diseases of the musculoskeletal system and connective tissue: Secondary | ICD-10-CM | POA: Insufficient documentation

## 2014-02-27 DIAGNOSIS — R519 Headache, unspecified: Secondary | ICD-10-CM

## 2014-02-27 DIAGNOSIS — I10 Essential (primary) hypertension: Secondary | ICD-10-CM | POA: Diagnosis not present

## 2014-02-27 DIAGNOSIS — R110 Nausea: Secondary | ICD-10-CM | POA: Insufficient documentation

## 2014-02-27 HISTORY — DX: Other intervertebral disc degeneration, lumbar region: M51.36

## 2014-02-27 HISTORY — DX: Essential (primary) hypertension: I10

## 2014-02-27 HISTORY — DX: Other intervertebral disc degeneration, lumbar region without mention of lumbar back pain or lower extremity pain: M51.369

## 2014-02-27 MED ORDER — KETOROLAC TROMETHAMINE 60 MG/2ML IM SOLN
30.0000 mg | Freq: Once | INTRAMUSCULAR | Status: AC
  Administered 2014-02-27: 30 mg via INTRAMUSCULAR
  Filled 2014-02-27: qty 2

## 2014-02-27 MED ORDER — METOCLOPRAMIDE HCL 5 MG/ML IJ SOLN
10.0000 mg | Freq: Once | INTRAMUSCULAR | Status: AC
  Administered 2014-02-27: 10 mg via INTRAMUSCULAR
  Filled 2014-02-27: qty 2

## 2014-02-27 MED ORDER — DIPHENHYDRAMINE HCL 50 MG/ML IJ SOLN
25.0000 mg | Freq: Once | INTRAMUSCULAR | Status: AC
  Administered 2014-02-27: 25 mg via INTRAMUSCULAR
  Filled 2014-02-27: qty 1

## 2014-02-27 MED ORDER — METOCLOPRAMIDE HCL 10 MG PO TABS: 10.0000 mg | ORAL_TABLET | Freq: Four times a day (QID) | ORAL | Status: DC | PRN

## 2014-02-27 NOTE — ED Notes (Signed)
Pt presents to department for evaluation of hypertension. Was seen yesterday for same, prescribed HCTZ 25mg , pt states she took medication this morning, continues to have headache and HTN. Pt is alert and oriented x4. No neurological deficits noted.

## 2014-02-27 NOTE — Discharge Instructions (Signed)
°Emergency Department Resource Guide °1) Find a Doctor and Pay Out of Pocket °Although you won't have to find out who is covered by your insurance plan, it is a good idea to ask around and get recommendations. You will then need to call the office and see if the doctor you have chosen will accept you as a new patient and what types of options they offer for patients who are self-pay. Some doctors offer discounts or will set up payment plans for their patients who do not have insurance, but you will need to ask so you aren't surprised when you get to your appointment. ° °2) Contact Your Local Health Department °Not all health departments have doctors that can see patients for sick visits, but many do, so it is worth a call to see if yours does. If you don't know where your local health department is, you can check in your phone book. The CDC also has a tool to help you locate your state's health department, and many state websites also have listings of all of their local health departments. ° °3) Find a Walk-in Clinic °If your illness is not likely to be very severe or complicated, you may want to try a walk in clinic. These are popping up all over the country in pharmacies, drugstores, and shopping centers. They're usually staffed by nurse practitioners or physician assistants that have been trained to treat common illnesses and complaints. They're usually fairly quick and inexpensive. However, if you have serious medical issues or chronic medical problems, these are probably not your best option. ° °No Primary Care Doctor: °- Call [facility] at  832-8000 - they can help you locate a primary care doctor that  accepts your insurance, provides certain services, etc. °- Physician Referral Service- 1-800-533-3463 ° °Chronic Pain Problems: °Organization         Address  Phone   Notes  ° [facility]  (336) 297-2271 Patients need to be referred by their primary care doctor.  ° °Medication  Assistance: °Organization         Address  Phone   Notes  °[place] [facility] [address] °[place], [place] [address] (336) 641-8030 --Must be a resident of [place] °-- Must have NO insurance coverage whatsoever (no Medicaid/ Medicare, etc.) °-- The pt. MUST have a primary care doctor that directs their care regularly and follows them in the community °  °[facility]  (866) 331-1348   °[employer]  (888) 892-1162   ° °Agencies that provide inexpensive medical care: °Organization         Address  Phone   Notes  °[facility]  (336) 832-8035   °[facility]    (336) 832-7272   °[facility] [address] °[place], [place] [address] (336) 832-4777   °[facility] [address], °[place] (336) 271-4999   °[facility]    (336) 373-0678   °[facility]    (336) 272-1050   °[facility] ° [address], [place] Phone:  (336) 832-4444, Fax:  (336) 832-4440 Hours of Operation:  9 am - 6 pm, M-F.  Also accepts Medicaid/Medicare and self-pay.  °[facility] ° [address], [place] Phone: (336) 832-3150, Fax: (336) [phone]. Hours of Operation:  8:30 am - 5:30 pm, M-F.  Also accepts Medicaid and self-pay.  °[facility] [place] [address], [place] Phone: (336) 878-6027   °[facility] [address], [place], [place] (336)723-1848, Ext. 123 Mondays & Thursdays: 7-9 AM.  First 15 patients are seen on a first come, first serve basis. °  ° °Medicaid-accepting [place] Providers: ° °Organization         Address  Phone   Notes  °[facility] [address], [place] (336) 641-2100 Also accepts self-pay patients.  °[facility] [address], [place] ° (336) 856-9996   °[facility] [address], [place]  (336) 288-8857   °[facility] [address] [place] [address], [place] (336) 299-7000   °Veita Bland [address], [place]  ° (336) 373-1557 Only accepts  Access Medicaid patients after they have their name applied to their card.  ° °Self-Pay (no insurance) in [place]: ° °Organization         Address  Phone   Notes  °Sickle Cell Patients, [facility] [address], [place] (336) 832-1970   °[facility] [address], [place] (336) 832-4400   °[facility] ° [address],  (336) 992-4800   °[facility]/Dr. Osei-Bonsu ° [address] [place] [address], [place] or [address], [place] (336) [phone] Phone number for both [place] and [place] locations is the same.  °[facility] [address], [place] (336) 299-0000   °[facility] [address] [place] [address], [place] or [address] (336) 852-7530 °(336) 878-2260   °[facility] [address], [place] (336) 350-1642, phone; (336) 294-5005, fax Sees patients 1st and 3rd Saturday of every month.  Must not qualify for public or private insurance (i.e. Medicaid, Medicare, [place] Health Choice, Veterans' Benefits) • Household income should be no more than 200% of the poverty level •The clinic cannot treat you if you are pregnant or think you are pregnant • Sexually transmitted diseases are not treated at the clinic.  ° ° °Dental Care: °Organization         Address  Phone  Notes  °[place] [facility] [address], [place] (336) 641-6152 Accepts children up to age 21 who are enrolled in Medicaid or [place] Health Choice; pregnant women with a Medicaid card; and children who have applied for Medicaid or [place] Health Choice, but were declined, whose parents can pay a reduced fee at time of service.  °[place]  [facility] [place]  [address], [place] (336) 641-7733 Accepts children up to age 21 who are enrolled in Medicaid or [place] Health Choice; pregnant women with a Medicaid card; and children who have applied for Medicaid or [place] Health Choice, but were declined, whose parents can pay a reduced fee at time of service.  °[facility] ° [address],  (336) 641-4533 Patients are seen by appointment only. Walk-ins are not accepted. [facility] will see patients 18 years of age and older. °Monday - Tuesday (8am-5pm) °Most Wednesdays (8:30-5pm) °$30 per visit, cash only  °[facility] ° [address], [place] (336) 641-4533 Patients are seen by appointment only. Walk-ins are not accepted. [facility] will see patients 18 years of age and older. °One   Wednesday Evening (Monthly: Volunteer Based).  $30 per visit, cash only  °[facility]  (919) 537-3737 for adults; Children under age 4, call Graduate Pediatric Dentistry at (919) [phone]. Children aged 4-14, please call (919) [phone] to request a pediatric application. ° Dental services are provided in all areas of dental care including fillings, crowns and bridges, complete and partial dentures, implants, gum treatment, root canals, and extractions. Preventive care is also provided. Treatment is provided to both adults and children. °Patients are selected via a lottery and there is often a waiting list. °  °[facility] [address], °[place] ° (336) 763-8833 [email or web address] °  °[facility] [address], [place], [place] (336)723-1848, Ext. 123 Second and Fourth Thursday of each month, opens at 6:30 AM; Clinic ends at 9 AM.  Patients are seen on a first-come first-served basis, and a limited number are seen during each clinic.  ° °[facility] ° [address], [place], [place] (336) 723-7904    Eligibility Requirements °You must have lived in [place], [place], or [place] counties for at least the last three months. °  You cannot be eligible for state or federal sponsored healthcare insurance, including Veterans Administration, Medicaid, or Medicare. °  You generally cannot be eligible for healthcare insurance through your employer.  °  How to apply: °Eligibility screenings are held every Tuesday and Wednesday afternoon from 1:00 pm until 4:00 pm. You do not need an appointment for the interview!  °[facility] [address], [place], [place] 336-631-2330   °[facility]  336-342-8273   °[facility]  336-703-3100   °[facility]  336-570-6415   ° °Behavioral Health Resources in the Community: °Intensive Outpatient Programs °Organization         Address  Phone  Notes  °[facility] [address], [place], [place] 336-878-6098   °[facility] [address], [place], [place] 336-832-9800   °[facility] [address], [place], [place] ° 336-882-2125   °[facility] [address],  °[place], [place] 1-800-853-5163 or 336-641-4981   °Substance Abuse Resources °Organization         Address  Phone  Notes  °[facility]  336-882-2125   °[facility]  336-784-9470   °[facility]  336-285-9073   °[facility]  336-845-3988   °Residential & Outpatient Substance Abuse Program  1-800-659-3381   °Psychological Services °Organization         Address  Phone  Notes  °[facility]  336- 832-9600   °[facility]  336- 378-7881   °[facility] [address], [place] 1-800-853-5163 or 336-641-4981   ° °Mobile Crisis Teams °Organization         Address  Phone  Notes  °[facility], Mobile Crisis Care Unit  1-877-626-1772   °Assertive °[facility] ° [address]  [place],  336-834-9664   °Sharon DeEsch [address] °[place]  336-554-5454   ° °Self-Help/Support Groups °Organization         Address  Phone             Notes  °[facility]  - variety of support groups  336- 373-1402 Call for more information  °Narcotics Anonymous (NA), [facility] [address], °[place]   2 meetings at this location  ° °  Residential Treatment Programs Organization         Address  Phone  Notes  [facility] [address],    [place]  1-(815)851-9414   [facility]  [address] [place] [address], [place], [place]   [place], [place] 541-856-9515 Admissions: 8am-3pm M-F  [facility] [place] [address],    [place], [place] 169-678-9381   [facility] [address] [place], [place], [place]   [facility] [address],  [place], [place]   [facility] Intensive Outpatient [address], [place], [place]   [facility] [place]  [place], [place] 1-418-752-4267 or 816-499-0180   [facility] [address], [place], [place] Accepts Medicaid  [facility] [address]  [place] 1-8591280946 Substance Abuse/Addiction Treatment   [facility]         Address  Phone  Notes  [facility]  508-338-9836   Domenic Schwab, PhD [address] Arlis Porta [place], [place]   802-840-2104 or (808)364-8998   [facility] [place], [place] 939-277-4839   [facility] [address] [place], [place] 909-475-1794 Insurance/Medicaid/sponsorship through [facility] [address]                                    [place], [place] (904) 734-1677 [place] [address], [place] (984)515-9136    Dr. Adele Schilder  [phone]   [facility] Dept. 1) [address] [place] 2) [place] 3)  [address], [place] 318-594-3037 ([phone]  787-425-6748   [place] 629-343-7986 or [phone] (After Hours)      Take over the counter tylenol, ibuprofen (OR excedrin) and benadryl, as directed on packaging, with the prescription given to you today, as needed for headache.  Keep a headache diary, as discussed.  Take your usual prescriptions as previously directed.  Take your blood pressure only once per day, either in the morning after you take your medicine(s) or in the evening before you go to bed, once or twice per week.  Always sit quietly for at least 15 to 20 minutes before taking your blood pressure.  Keep a diary of your blood pressures to show your doctor at your follow up office visit.  Call your regular medical doctor tomorrow morning to schedule a follow up appointment in the next 2 days.  Return to the Emergency Department immediately sooner if worsening.

## 2014-02-27 NOTE — ED Provider Notes (Signed)
CSN: [id number]     Arrival date & time 02/27/14  1423 History   First MD Initiated Contact with Patient 02/27/14 1528     Chief Complaint  Patient presents with  . Hypertension      HPI  Pt was seen at 1535. Per pt, c/o gradual onset and persistence of constant left sided frontal headache for the past 4  days. Has been associated with mild nausea and "blurry vision" 2 days ago (has continued resolved). Pt was evaluated in the ED yesterday for same, noted to have HTN, and started on HCTZ. States she was tx in the ED "with some medicines" for her headache which improved it until today when the headache returned. Pt states she "took a dose of the medicine today but my blood pressure is still high." Pt's husband states pt took her BP at home today "without resting quietly for at least 15 to 20 minutes first." Pt has not taken any meds for the headache. Denies eye pain, no further visual changes, no focal motor weakness, no tingling/numbness in extremities, no facial droop, no slurred speech, no SOB/CP, no abd pain, no vomiting/diarrhea, no fevers, no neck pain. Denies headache was sudden or maximal at onset or at any time.    Past Medical History  Diagnosis Date  . Gall stones   . Hypertension   . DDD (degenerative disc disease), lumbar    Past Surgical History  Procedure Laterality Date  . Cholecystectomy    . Tubal ligation    . Breast surgery  2012    breast biopsy  . Axillary lymph node dissection Left 11/24/2012    Procedure: incision and drainage axillary abscess;  Surgeon: Adin Hector, MD;  Location: [place];  Service: General;  Laterality: Left;    History  Substance Use Topics  . Smoking status: Never Smoker   . Smokeless tobacco: Never Used  . Alcohol Use: No    Review of Systems ROS: Statement: All systems negative except as marked or noted in the HPI; Constitutional: Negative for fever and chills. ; ; Eyes: Negative for eye pain, redness and discharge. ; ; ENMT:  Negative for ear pain, hoarseness, nasal congestion, sinus pressure and sore throat. ; ; Cardiovascular: Negative for chest pain, palpitations, diaphoresis, dyspnea and peripheral edema. ; ; Respiratory: Negative for cough, wheezing and stridor. ; ; Gastrointestinal: +nausea. Negative for vomiting, diarrhea, abdominal pain, blood in stool, hematemesis, jaundice and rectal bleeding. . ; ; Genitourinary: Negative for dysuria, flank pain and hematuria. ; ; Musculoskeletal: Negative for back pain and neck pain. Negative for swelling and trauma.; ; Skin: Negative for pruritus, rash, abrasions, blisters, bruising and skin lesion.; ; Neuro: +headache. Negative for lightheadedness and neck stiffness. Negative for weakness, altered level of consciousness , altered mental status, extremity weakness, paresthesias, involuntary movement, seizure and syncope.      Allergies  Review of patient's allergies indicates no known allergies.  Home Medications   Prior to Admission medications   Medication Sig Start Date End Date Taking? Authorizing Provider  hydrochlorothiazide (HYDRODIURIL) 25 MG tablet Take 0.5 tablets (12.5 mg total) by mouth daily. 02/26/14  Yes Shari A Upstill, PA-C  ibuprofen (ADVIL,MOTRIN) 200 MG tablet Take 400 mg by mouth every 6 (six) hours as needed for headache.   Yes Historical Provider, MD   BP 132/75 mmHg  Pulse 62  Temp(Src) 98.1 F (36.7 C) (Oral)  Resp 18  Ht 5\' 7"  (1.702 m)  Wt 180 lb (81.647 kg)  BMI 28.19 kg/m2  SpO2 100%  LMP 08/23/2012 Physical Exam  1540; Physical examination:  Nursing notes reviewed; Vital signs and O2 SAT reviewed;  Constitutional: Well developed, Well nourished, Well hydrated, In no acute distress; Head:  Normocephalic, atraumatic. NT bilat temporal areas. NT bilat mastoid areas.; Eyes: EOMI, PERRL, No scleral icterus; ENMT: TM's clear bilat. +edemetous nasal turbinates bilat with clear rhinorrhea. Mouth and pharynx normal, Mucous membranes moist; Neck:  Supple, Full range of motion, No lymphadenopathy; Cardiovascular: Regular rate and rhythm, No murmur, rub, or gallop; Respiratory: Breath sounds clear & equal bilaterally, No rales, rhonchi, wheezes.  Speaking full sentences with ease, Normal respiratory effort/excursion; Chest: Nontender, Movement normal; Abdomen: Soft, Nontender, Nondistended, Normal bowel sounds; Genitourinary: No CVA tenderness; Extremities: Pulses normal, No tenderness, No edema, No calf edema or asymmetry.; Neuro: AA&Ox3, Major CN grossly intact.Speech clear.  No facial droop.  No nystagmus. Grips equal. Strength 5/5 equal bilat UE's and LE's.  DTR 2/4 equal bilat UE's and LE's.  No gross sensory deficits.  Normal cerebellar testing bilat UE's (finger-nose) and LE's (heel-shin).  No drift x4 extremities.; Skin: Color normal, Warm, Dry.; Psych:  Affect flat, poor eye contact.    ED Course  Procedures     EKG Interpretation None      MDM   MDM Reviewed: previous chart, nursing note and vitals Reviewed previous: labs, CT scan and ECG   Results for orders placed or performed during the hospital encounter of 16/96/78  Basic metabolic panel  Result Value Ref Range   Sodium 141 137 - 147 mEq/L   Potassium 4.1 3.7 - 5.3 mEq/L   Chloride 105 96 - 112 mEq/L   CO2 22 19 - 32 mEq/L   Glucose, Bld 96 70 - 99 mg/dL   BUN 14 6 - 23 mg/dL   Creatinine, Ser 0.96 0.50 - 1.10 mg/dL   Calcium 9.3 8.4 - 10.5 mg/dL   GFR calc non Af Amer 66 (L) >90 mL/min   GFR calc Af Amer 77 (L) >90 mL/min   Anion gap 14 5 - 15   Ct Head Wo Contrast 02/26/2014   CLINICAL DATA:  Headache and hypertension.  Blurred vision  EXAM: CT HEAD WITHOUT CONTRAST  TECHNIQUE: Contiguous axial images were obtained from the base of the skull through the vertex without intravenous contrast.  COMPARISON:  None.  FINDINGS: The brainstem, cerebellum, cerebral peduncles, thalamus, basal ganglia, basilar cisterns, and ventricular system appear within normal limits.  Periventricular white matter and corona radiata hypodensities favor chronic ischemic microvascular white matter disease. No intracranial hemorrhage, mass lesion, or acute CVA.  IMPRESSION: 1. Periventricular white matter and corona radiata hypodensities favor chronic ischemic microvascular white matter disease. No acute intracranial findings.   Electronically Signed   By: Sherryl Barters M.D.   On: 02/26/2014 21:27    1545:  ED workup yesterday reassuring. Pt's BP today spontaneously improved without intervention. Long d/w pt and her husband regarding home BP monitoring and slow/gradual improvement of BP over time (vs immediately decreasing BP). Both verb understanding. Pt states she feels better after meds and wants to go home now. Dx and testing d/w pt and family.  Questions answered.  Verb understanding, agreeable to d/c home with outpt f/u.     Francine Graven, DO 03/02/14 1406

## 2014-04-02 ENCOUNTER — Ambulatory Visit (INDEPENDENT_AMBULATORY_CARE_PROVIDER_SITE_OTHER)
Admission: RE | Admit: 2014-04-02 | Discharge: 2014-04-02 | Disposition: A | Discharge status: Home / Self Care | Payer: PRIVATE HEALTH INSURANCE | Source: Ambulatory Visit | Attending: Family | Admitting: Family

## 2014-04-02 ENCOUNTER — Telehealth: Payer: Self-pay | Admitting: Family

## 2014-04-02 ENCOUNTER — Ambulatory Visit (INDEPENDENT_AMBULATORY_CARE_PROVIDER_SITE_OTHER): Payer: PRIVATE HEALTH INSURANCE | Admitting: Family

## 2014-04-02 ENCOUNTER — Encounter: Payer: Self-pay | Admitting: Family

## 2014-04-02 VITALS — BP 132/88 | HR 76 | Temp 97.7°F | Resp 18 | Ht 66.0 in | Wt 212.8 lb

## 2014-04-02 DIAGNOSIS — R519 Headache, unspecified: Secondary | ICD-10-CM | POA: Insufficient documentation

## 2014-04-02 DIAGNOSIS — R51 Headache: Secondary | ICD-10-CM

## 2014-04-02 DIAGNOSIS — M79604 Pain in right leg: Secondary | ICD-10-CM

## 2014-04-02 MED ORDER — MELOXICAM 7.5 MG PO TABS: 7.5000 mg | ORAL_TABLET | Freq: Every day | ORAL | Status: DC

## 2014-04-02 MED ORDER — METHYLPREDNISOLONE (PAK) 4 MG PO TABS: ORAL_TABLET | ORAL | Status: DC

## 2014-04-02 NOTE — Progress Notes (Signed)
   Subjective:    Patient ID: [name], female    DOB: 26-Dec-1960, 54 y.o.   MRN: [id number]  Chief Complaint  Patient presents with  . Establish Care    has been put on BP pills due to having headaches, she is still haviing headaches x1 month, has degenerative disk disease in lower back and has severe pain in right leg x3 months    HPI:  KIMETHA TRULSON is a 54 y.o. female who presents today to establish care and discuss her blood pressure and low back pain.  1) Back pain - Acute symptoms started about 3 months ago. Experiencing associated symptoms of tooth-ache like pain in her right side with no back pain at present. Denies any modifying factors that make it better. Current intensity of the pain is 8/10. Timing of pain is worse at night with intensity strong enough to impair sleep. Denies changes to bowel or bladder habits.   2) Headaches - Acute sharp headaches with a frequency that comes and goes. Pain is located on the left side of her head. Modifying factor of advil has been tried but does not help at all. Indicates she is getting 1 headache every 2-3 days.   No Known Allergies  Current Outpatient Prescriptions on File Prior to Visit  Medication Sig Dispense Refill  . hydrochlorothiazide (HYDRODIURIL) 25 MG tablet Take 0.5 tablets (12.5 mg total) by mouth daily. 30 tablet 0  . ibuprofen (ADVIL,MOTRIN) 200 MG tablet Take 400 mg by mouth every 6 (six) hours as needed for headache.     No current facility-administered medications on file prior to visit.    Past Medical History  Diagnosis Date  . Gall stones   . Hypertension   . DDD (degenerative disc disease), lumbar      Review of Systems  Musculoskeletal: Positive for arthralgias (Right hip pain) and gait problem.  Neurological: Negative for numbness.      Objective:    BP 132/88 mmHg  Pulse 76  Temp(Src) 97.7 F (36.5 C) (Oral)  Resp 18  Ht 5\' 6"  (1.676 m)  Wt 212 lb 12.8 oz (96.525 kg)  BMI 34.36 kg/m2   SpO2 95%  LMP 08/23/2012 Nursing note and vital signs reviewed.  Physical Exam  Constitutional: She is oriented to person, place, and time. She appears well-developed and well-nourished. No distress.  Cardiovascular: Normal rate, regular rhythm, normal heart sounds and intact distal pulses.   Pulmonary/Chest: Effort normal and breath sounds normal.  Musculoskeletal:  No obvious deformity, discoloration or deformity of right hip or lumbar spine noted. Palpable tenderness anterior thigh. Exhibits full ROM in hip and lumbar spine. SLR negative. Sensation and pulses are intact distally.   Neurological: She is alert and oriented to person, place, and time. She has normal reflexes. No cranial nerve deficit. Coordination normal.  Skin: Skin is warm and dry.  Psychiatric: She has a normal mood and affect. Her behavior is normal. Judgment and thought content normal.       Assessment & Plan:

## 2014-04-02 NOTE — Telephone Encounter (Signed)
Please inform patient that her hip x-rays were negative for fracture. Therefore please continue the steroids and follow up after completion.

## 2014-04-02 NOTE — Progress Notes (Signed)
Pre visit review using our clinic review tool, if applicable. No additional management support is needed unless otherwise documented below in the visit note. 

## 2014-04-02 NOTE — Assessment & Plan Note (Signed)
Question right hip pain versus sciatica. Given anterior symptoms of ache, cannot rule out potential stress fracture of femur. Obtain x-rays of right hip to rule out fracture. Start mobic as needed for inflammation. Start medrol dose pack. If symptoms fail to improve consider bone scan or further imaging.

## 2014-04-02 NOTE — Assessment & Plan Note (Signed)
Headaches were believed to be related to blood pressure. Given description of symptoms cannot rule out blood pressure as a cause. Continue to manage headaches with over the counter medications. Track headaches for potential triggers. Continue current dose of HCTZ to rule out potential cause.

## 2014-04-02 NOTE — Patient Instructions (Addendum)
Thank you for choosing [facility].  Summary/Instructions:  Your prescription(s) have been submitted to your pharmacy or been printed and provided for you. Please take as directed and contact our office if you believe you are having problem(s) with the medication(s) or have any questions.  If your symptoms worsen or fail to improve, please contact our office for further instruction, or in case of emergency go directly to the emergency room at the closest medical facility.     

## 2014-04-03 NOTE — Telephone Encounter (Signed)
Please inform the patient that the first line treatment for hot flashes is generally over the counter supplements like black cohosh or soy isoflavones (both available in the vitamin and supplement aisle). Try only one at a time. If these are not successful, then we can refer her to gynecology for potential hormone therapy for the hot flashes.

## 2014-04-03 NOTE — Telephone Encounter (Signed)
Pt called in and wanted to know if greg could call her something in for hot flashes?       Best number [phone] work number  [phone] cell

## 2014-04-04 MED ORDER — HYDROCHLOROTHIAZIDE 25 MG PO TABS: 12.5000 mg | ORAL_TABLET | Freq: Every day | ORAL | Status: DC

## 2014-04-04 NOTE — Telephone Encounter (Signed)
Called and left pt VM to call back  

## 2014-04-04 NOTE — Telephone Encounter (Signed)
Pt is aware of a few otcs that might help with hot flashes. She also wanted a refill of her BP pills which I will go ahead and send to her pharmacy.

## 2014-04-10 ENCOUNTER — Telehealth: Payer: Self-pay | Admitting: Family

## 2014-04-10 NOTE — Telephone Encounter (Signed)
Please find out if the pain is manageable with the mobic. The medication is not going to take the pain away but make it more manageable. If it is not we can send her a new medication.

## 2014-04-10 NOTE — Telephone Encounter (Signed)
Pt called in and said that she has completed meds and she is still in pain.  What should she do?    Best number [phone]- cell  [phone] work

## 2014-04-11 NOTE — Telephone Encounter (Signed)
Tried calling pt. Left a VM for her to call back.

## 2014-04-13 MED ORDER — ACETAMINOPHEN-CODEINE #3 300-30 MG PO TABS: 1.0000 | ORAL_TABLET | Freq: Four times a day (QID) | ORAL | Status: DC | PRN

## 2014-04-13 NOTE — Telephone Encounter (Signed)
Pts husband is going to come pick up rx [employer].

## 2014-04-13 NOTE — Telephone Encounter (Signed)
Patient has called back.  She is frustrated.  She is requesting a call back at [phone].  Patient states she is still in pain.

## 2014-04-13 NOTE — Telephone Encounter (Signed)
Says the mobic is not working would like something else sent in.

## 2014-04-13 NOTE — Telephone Encounter (Signed)
New medication printed.

## 2014-05-18 ENCOUNTER — Telehealth: Payer: Self-pay | Admitting: Family

## 2014-05-18 NOTE — Telephone Encounter (Signed)
Pt stated that she has been taking her BP pills as prescribed and she has still been having her headaches pretty bad and her BP on Monday of this week was at 150/114. Also, pt states that the Tylenol 3 that was given for her leg pain hasn't touched the pain. Her leg is still waking her up in the night and there has been no improvement. Feels like neither medications have helped her situation much. Please advise on what pt should do next.

## 2014-05-18 NOTE — Telephone Encounter (Signed)
Please have the patient make an office visit to discuss her pain and re-evaluate her blood pressure.

## 2014-05-18 NOTE — Telephone Encounter (Signed)
Pt request to speak to the assistant concern about BP medication and her leg. Offer an appt but pt would like to speak to the assistant first. Please call her

## 2014-05-18 NOTE — Telephone Encounter (Signed)
LVM for pt to call back.

## 2014-05-21 ENCOUNTER — Ambulatory Visit (INDEPENDENT_AMBULATORY_CARE_PROVIDER_SITE_OTHER): Payer: PRIVATE HEALTH INSURANCE | Admitting: Family

## 2014-05-21 ENCOUNTER — Encounter: Payer: Self-pay | Admitting: Family

## 2014-05-21 VITALS — BP 124/98 | HR 64 | Temp 97.6°F | Resp 18 | Wt 209.0 lb

## 2014-05-21 DIAGNOSIS — I10 Essential (primary) hypertension: Secondary | ICD-10-CM

## 2014-05-21 DIAGNOSIS — R51 Headache: Secondary | ICD-10-CM

## 2014-05-21 DIAGNOSIS — M5136 Other intervertebral disc degeneration, lumbar region: Secondary | ICD-10-CM | POA: Insufficient documentation

## 2014-05-21 DIAGNOSIS — M51369 Other intervertebral disc degeneration, lumbar region without mention of lumbar back pain or lower extremity pain: Secondary | ICD-10-CM

## 2014-05-21 DIAGNOSIS — R519 Headache, unspecified: Secondary | ICD-10-CM

## 2014-05-21 MED ORDER — LISINOPRIL 20 MG PO TABS: 20.0000 mg | ORAL_TABLET | Freq: Every day | ORAL | Status: DC

## 2014-05-21 MED ORDER — MELOXICAM 7.5 MG PO TABS: 7.5000 mg | ORAL_TABLET | Freq: Every day | ORAL | Status: DC

## 2014-05-21 MED ORDER — HYDROCHLOROTHIAZIDE 25 MG PO TABS: 25.0000 mg | ORAL_TABLET | Freq: Every day | ORAL | Status: DC

## 2014-05-21 MED ORDER — KETOROLAC TROMETHAMINE 10 MG PO TABS: ORAL_TABLET | ORAL | Status: DC

## 2014-05-21 NOTE — Assessment & Plan Note (Signed)
Blood pressure remained slightly elevated over goal 140/90. Given blood pressures at home, continue hydrochlorothiazide at current dosage. Lisinopril 20 mg daily. Follow up if cough develops. Continue blood pressure monitoring at home and will follow up if no improvement is noted.

## 2014-05-21 NOTE — Progress Notes (Signed)
   Subjective:    Patient ID: Jamie Jenkins, female    DOB: [birth year], 54 y.o.   MRN: [id number]  Chief Complaint  Patient presents with  . Follow-up    Says she has been taking her BP meds everyday as prescribed and her BP is still high and still having headaches just about everyday    HPI:  Jamie Jenkins is a 55 y.o. female who presents today for follow up.  1) Blood pressure was previously uncontrolled and is treated with HCTZ. Denies any adverse side effects of medication. Indicates her home blood pressure reading are readings in the 150's/90-100.   BP Readings from Last 3 Encounters:  05/21/14 124/98  04/02/14 132/88  02/27/14 156/81    2) Headache - This is a chronic problem. Associated symptoms of left sided headache started this morning. Headache is described as throbbing, with an intensity of 8/10 with sensitivity to light and sound. Did have some nausea, but that has passed. Denies any modifying factors.   3) Low back pain - Continues to experience the associated symptoms of low back pain associated with her degenerative disc disease. Indicates the previous Medrol dose pack did not help very much and would like to know what the next step is.   No Known Allergies   Current Outpatient Prescriptions on File Prior to Visit  Medication Sig Dispense Refill  . acetaminophen-codeine (TYLENOL #3) 300-30 MG per tablet Take 1 tablet by mouth every 6 (six) hours as needed for moderate pain. 30 tablet 0  . hydrochlorothiazide (HYDRODIURIL) 25 MG tablet Take 0.5 tablets (12.5 mg total) by mouth daily. 30 tablet 2  . ibuprofen (ADVIL,MOTRIN) 200 MG tablet Take 400 mg by mouth every 6 (six) hours as needed for headache.    . meloxicam (MOBIC) 7.5 MG tablet Take 1-2 tablets (7.5-15 mg total) by mouth daily. 60 tablet 0  . methylPREDNIsolone (MEDROL DOSPACK) 4 MG tablet follow package directions 21 tablet 0   No current facility-administered medications on file prior to visit.     Review of Systems  Eyes:       Denies changes in vision  Respiratory: Negative for chest tightness.   Cardiovascular: Negative for chest pain, palpitations and leg swelling.  Musculoskeletal: Positive for back pain.  Neurological: Positive for headaches.      Objective:    BP 124/98 mmHg  Pulse 64  Temp(Src) 97.6 F (36.4 C) (Oral)  Resp 18  Wt 209 lb (94.802 kg)  SpO2 99%  LMP 08/23/2012 Nursing note and vital signs reviewed.  Physical Exam  Constitutional: She is oriented to person, place, and time. She appears well-developed and well-nourished. No distress.  Eyes: Conjunctivae and EOM are normal. Pupils are equal, round, and reactive to light.  Neck: Neck supple.  Cardiovascular: Normal rate, regular rhythm, normal heart sounds and intact distal pulses.   Pulmonary/Chest: Effort normal and breath sounds normal.  Lymphadenopathy:    She has no cervical adenopathy.  Neurological: She is alert and oriented to person, place, and time. She has normal reflexes. No cranial nerve deficit. She exhibits normal muscle tone. Coordination normal.  Skin: Skin is warm and dry.  Psychiatric: She has a normal mood and affect. Her behavior is normal. Judgment and thought content normal.       Assessment & Plan:

## 2014-05-21 NOTE — Patient Instructions (Addendum)
Thank you for choosing [facility].  Summary/Instructions:  Your prescription(s) have been submitted to your pharmacy or been printed and provided for you. Please take as directed and contact our office if you believe you are having problem(s) with the medication(s) or have any questions.  If your symptoms worsen or fail to improve, please contact our office for further instruction, or in case of emergency go directly to the emergency room at the closest medical facility.   They will be in contact regarding your MRI and referral to orthopedics.  Please start taking the LISINOPRIL. If you develop a cough please stop the medication and let us know.

## 2014-05-21 NOTE — Assessment & Plan Note (Signed)
Patient continues to experience low back pain secondary to lumbar degenerative disc disease which was seen on x-rays from previous hospitalization. Medrol Dosepak failed to improve her symptoms. Obtain MRI to determine potential need for surgical candidacy. Refer to orthopedics for further management and potential cortisone injection. Refill Mobic and continue as needed.

## 2014-05-21 NOTE — Assessment & Plan Note (Signed)
Symptoms and exam today consistent with potential migraine headache. Discussed treatment options including Imitrex or Toradol with patient. Patient will like to proceed with Toradol, however like to use oral medication as a postinjection. Start Toradol. Emphasized importance of not using Toradol and meloxicam together as this could result in gastric ulcers. Patient was shocked to seek emergency care for worst headache of her life. Follow-up if symptoms worsen or fail to improve.

## 2014-05-21 NOTE — Progress Notes (Signed)
Pre visit review using our clinic review tool, if applicable. No additional management support is needed unless otherwise documented below in the visit note. 

## 2014-05-21 NOTE — Telephone Encounter (Signed)
Pt made an appointment for this afternoon  

## 2014-05-23 ENCOUNTER — Telehealth: Payer: Self-pay | Admitting: Family

## 2014-05-23 NOTE — Telephone Encounter (Signed)
emmi mailed  °

## 2014-06-05 ENCOUNTER — Telehealth: Payer: Self-pay | Admitting: Family

## 2014-06-05 MED ORDER — KETOROLAC TROMETHAMINE 10 MG PO TABS: ORAL_TABLET | ORAL | Status: DC

## 2014-06-05 NOTE — Telephone Encounter (Signed)
Pt called in wanted to know if Marya Amsler could call in more of her  ketorolac (TORADOL) 10 MG tablet [[id number]]   [facility] on [address]

## 2014-06-05 NOTE — Telephone Encounter (Signed)
Ok to refill 

## 2014-06-05 NOTE — Telephone Encounter (Signed)
Medication refilled

## 2014-06-21 ENCOUNTER — Telehealth: Payer: Self-pay | Admitting: Family

## 2014-06-21 MED ORDER — LISINOPRIL 20 MG PO TABS: 20.0000 mg | ORAL_TABLET | Freq: Every day | ORAL | Status: DC

## 2014-06-21 NOTE — Telephone Encounter (Signed)
Pt called and needs refill on lisinopril (PRINIVIL,ZESTRIL) 20 MG tablet [[id number]]  [facility] on [address]

## 2014-06-21 NOTE — Telephone Encounter (Signed)
Notified pt med has been sent to pharmacy...Jamie Jenkins

## 2014-08-24 ENCOUNTER — Encounter: Payer: Self-pay | Admitting: Family

## 2014-08-24 ENCOUNTER — Ambulatory Visit (INDEPENDENT_AMBULATORY_CARE_PROVIDER_SITE_OTHER): Payer: PRIVATE HEALTH INSURANCE | Admitting: Family

## 2014-08-24 ENCOUNTER — Ambulatory Visit (INDEPENDENT_AMBULATORY_CARE_PROVIDER_SITE_OTHER)
Admission: RE | Admit: 2014-08-24 | Discharge: 2014-08-24 | Disposition: A | Discharge status: Home / Self Care | Payer: PRIVATE HEALTH INSURANCE | Source: Ambulatory Visit | Attending: Family | Admitting: Family

## 2014-08-24 VITALS — BP 98/62 | HR 63 | Temp 98.2°F | Resp 22 | Ht 66.0 in | Wt 200.0 lb

## 2014-08-24 DIAGNOSIS — R05 Cough: Secondary | ICD-10-CM

## 2014-08-24 DIAGNOSIS — R059 Cough, unspecified: Secondary | ICD-10-CM | POA: Insufficient documentation

## 2014-08-24 MED ORDER — PREDNISONE 20 MG PO TABS: 20.0000 mg | ORAL_TABLET | Freq: Every day | ORAL | Status: DC

## 2014-08-24 MED ORDER — LEVOFLOXACIN 500 MG PO TABS: 500.0000 mg | ORAL_TABLET | Freq: Every day | ORAL | Status: DC

## 2014-08-24 MED ORDER — ALBUTEROL SULFATE (2.5 MG/3ML) 0.083% IN NEBU
5.0000 mg | INHALATION_SOLUTION | Freq: Once | RESPIRATORY_TRACT | Status: AC
  Administered 2014-08-24: 5 mg via RESPIRATORY_TRACT

## 2014-08-24 MED ORDER — METHYLPREDNISOLONE ACETATE 80 MG/ML IJ SUSP
80.0000 mg | Freq: Once | INTRAMUSCULAR | Status: AC
  Administered 2014-08-24: 80 mg via INTRAMUSCULAR

## 2014-08-24 NOTE — Patient Instructions (Addendum)
Thank you for choosing [facility].  Summary/Instructions:  Your prescription(s) have been submitted to your pharmacy or been printed and provided for you. Please take as directed and contact our office if you believe you are having problem(s) with the medication(s) or have any questions.  Please stop by radiology on the basement level of the building for your x-rays. Your results will be released to MyChart (or called to you) after review, usually within 72 hours after test completion. If any treatments or changes are necessary, you will be notified at that same time.  If your symptoms worsen or fail to improve, please contact our office for further instruction, or in case of emergency go directly to the emergency room at the closest medical facility.     

## 2014-08-24 NOTE — Progress Notes (Signed)
Subjective:    Patient ID: [name], female    DOB: 12/28/1960, 54 y.o.   MRN: [id number]  Chief Complaint  Patient presents with  . Cough    says it started out with a cold 2 weeks ago and now its getting to where she has a bad cough and SOB    HPI:  JESSAMINE BARCIA is a 54 y.o. female with a PMH of hypertension, DDD,  who presents today for an acute office.  Associated symptoms of cough, congestion, and shortness of breath has been going on for about 2 weeks. It started with a sore throat and has progressed. Modifying factors include Mucinex, Alkaseltzer and multiple cold/flu combinations. She did take one antibiotic pill that she had left over but does not recall the name of it.    No Known Allergies   Current Outpatient Prescriptions on File Prior to Visit  Medication Sig Dispense Refill  . acetaminophen-codeine (TYLENOL #3) 300-30 MG per tablet Take 1 tablet by mouth every 6 (six) hours as needed for moderate pain. 30 tablet 0  . hydrochlorothiazide (HYDRODIURIL) 25 MG tablet Take 1 tablet (25 mg total) by mouth daily. 90 tablet 3  . ibuprofen (ADVIL,MOTRIN) 200 MG tablet Take 400 mg by mouth every 6 (six) hours as needed for headache.    . ketorolac (TORADOL) 10 MG tablet Take 2 tablets as needed for headache, may take 1 additional tablet every 4 hours to max 4 tablets. 20 tablet 0  . lisinopril (PRINIVIL,ZESTRIL) 20 MG tablet Take 1 tablet (20 mg total) by mouth daily. 90 tablet 3  . meloxicam (MOBIC) 7.5 MG tablet Take 1-2 tablets (7.5-15 mg total) by mouth daily. 60 tablet 0   No current facility-administered medications on file prior to visit.    Review of Systems  Constitutional: Negative for fever and chills.  HENT: Positive for congestion. Negative for ear pain, sinus pressure and sore throat.   Respiratory: Positive for cough and shortness of breath. Negative for chest tightness.   Neurological: Negative for headaches.      Objective:    BP 98/62 mmHg   Pulse 63  Temp(Src) 98.2 F (36.8 C) (Oral)  Resp 22  Ht 5\' 6"  (1.676 m)  Wt 200 lb (90.719 kg)  BMI 32.30 kg/m2  SpO2 99%  LMP 08/23/2012 Nursing note and vital signs reviewed.   Physical Exam  Constitutional: She is oriented to person, place, and time. She appears well-developed and well-nourished. She appears distressed.  HENT:  Right Ear: Hearing, tympanic membrane, external ear and ear canal normal.  Left Ear: Hearing, tympanic membrane, external ear and ear canal normal.  Nose: Nose normal. Right sinus exhibits no maxillary sinus tenderness and no frontal sinus tenderness. Left sinus exhibits no maxillary sinus tenderness and no frontal sinus tenderness.  Mouth/Throat: Uvula is midline, oropharynx is clear and moist and mucous membranes are normal.  Cardiovascular: Normal rate, regular rhythm, normal heart sounds and intact distal pulses.   Pulmonary/Chest: Effort normal and breath sounds normal.  Neurological: She is alert and oriented to person, place, and time.  Skin: Skin is warm and dry.  Psychiatric: She has a normal mood and affect. Her behavior is normal. Judgment and thought content normal.       Assessment & Plan:   Problem List Items Addressed This Visit      Other   Cough - Primary    Symptoms and exam consistent with pneumonia given the shortness of breath. Obtain  chest x-ray. In office albuterol nebulizer treatment given. In office Depo-Medrol injection provided. Start levaquin. Start prednisone. Continue over the counter medications as needed for symptom relief and supportive care. Seek further care if symptoms worsen or fail to improve.       Relevant Medications   albuterol (PROVENTIL) (2.5 MG/3ML) 0.083% nebulizer solution 5 mg (Completed)   levofloxacin (LEVAQUIN) 500 MG tablet   predniSONE (DELTASONE) 20 MG tablet   Other Relevant Orders   DG Chest 2 View

## 2014-08-24 NOTE — Progress Notes (Signed)
Pre visit review using our clinic review tool, if applicable. No additional management support is needed unless otherwise documented below in the visit note. 

## 2014-08-24 NOTE — Assessment & Plan Note (Addendum)
Symptoms and exam consistent with pneumonia given the shortness of breath. Obtain chest x-ray. In office albuterol nebulizer treatment given. In office Depo-Medrol injection provided. Start levaquin. Start prednisone. Continue over the counter medications as needed for symptom relief and supportive care. Seek further care if symptoms worsen or fail to improve.

## 2014-08-25 ENCOUNTER — Telehealth: Payer: Self-pay | Admitting: Family

## 2014-08-25 NOTE — Telephone Encounter (Signed)
Please inform patient that her chest x-ray showed no evidence of pneumonia, therefore this is most likely bronchitis. Please continue the treatment as we discussed and follow-up if her symptoms worsen or fail to improve.

## 2014-08-27 NOTE — Telephone Encounter (Signed)
LVM letting pt know the results.

## 2014-08-31 ENCOUNTER — Telehealth: Payer: Self-pay | Admitting: Family

## 2014-08-31 MED ORDER — HYDROCHLOROTHIAZIDE 25 MG PO TABS: 25.0000 mg | ORAL_TABLET | Freq: Every day | ORAL | Status: DC

## 2014-08-31 NOTE — Telephone Encounter (Signed)
Patient is requesting a refill on hydrochlorothiazide to be sent to [facility] on [address]

## 2014-08-31 NOTE — Telephone Encounter (Signed)
Rx sent and pt aware 

## 2014-11-22 ENCOUNTER — Telehealth: Payer: Self-pay | Admitting: *Deleted

## 2014-11-22 MED ORDER — LISINOPRIL 20 MG PO TABS: 20.0000 mg | ORAL_TABLET | Freq: Every day | ORAL | Status: DC

## 2014-11-22 MED ORDER — HYDROCHLOROTHIAZIDE 25 MG PO TABS: 25.0000 mg | ORAL_TABLET | Freq: Every day | ORAL | Status: DC

## 2014-11-22 NOTE — Telephone Encounter (Signed)
Left msg on triage requesting a 90 days supply on her blood pressure medicine. Notified pt refills sent to [facility]...Jamie Jenkins

## 2015-04-04 IMAGING — CR DG HIP (WITH OR WITHOUT PELVIS) 2-3V*R*
3 series · 3 of 3 positions shown · non-contrast
Comparison: None.

CLINICAL DATA: Right leg after arriving and soreness for 3 months.

EXAM:
DG HIP W/ PELVIS 2-3 V*R*

[view not recorded (1 of 3)]
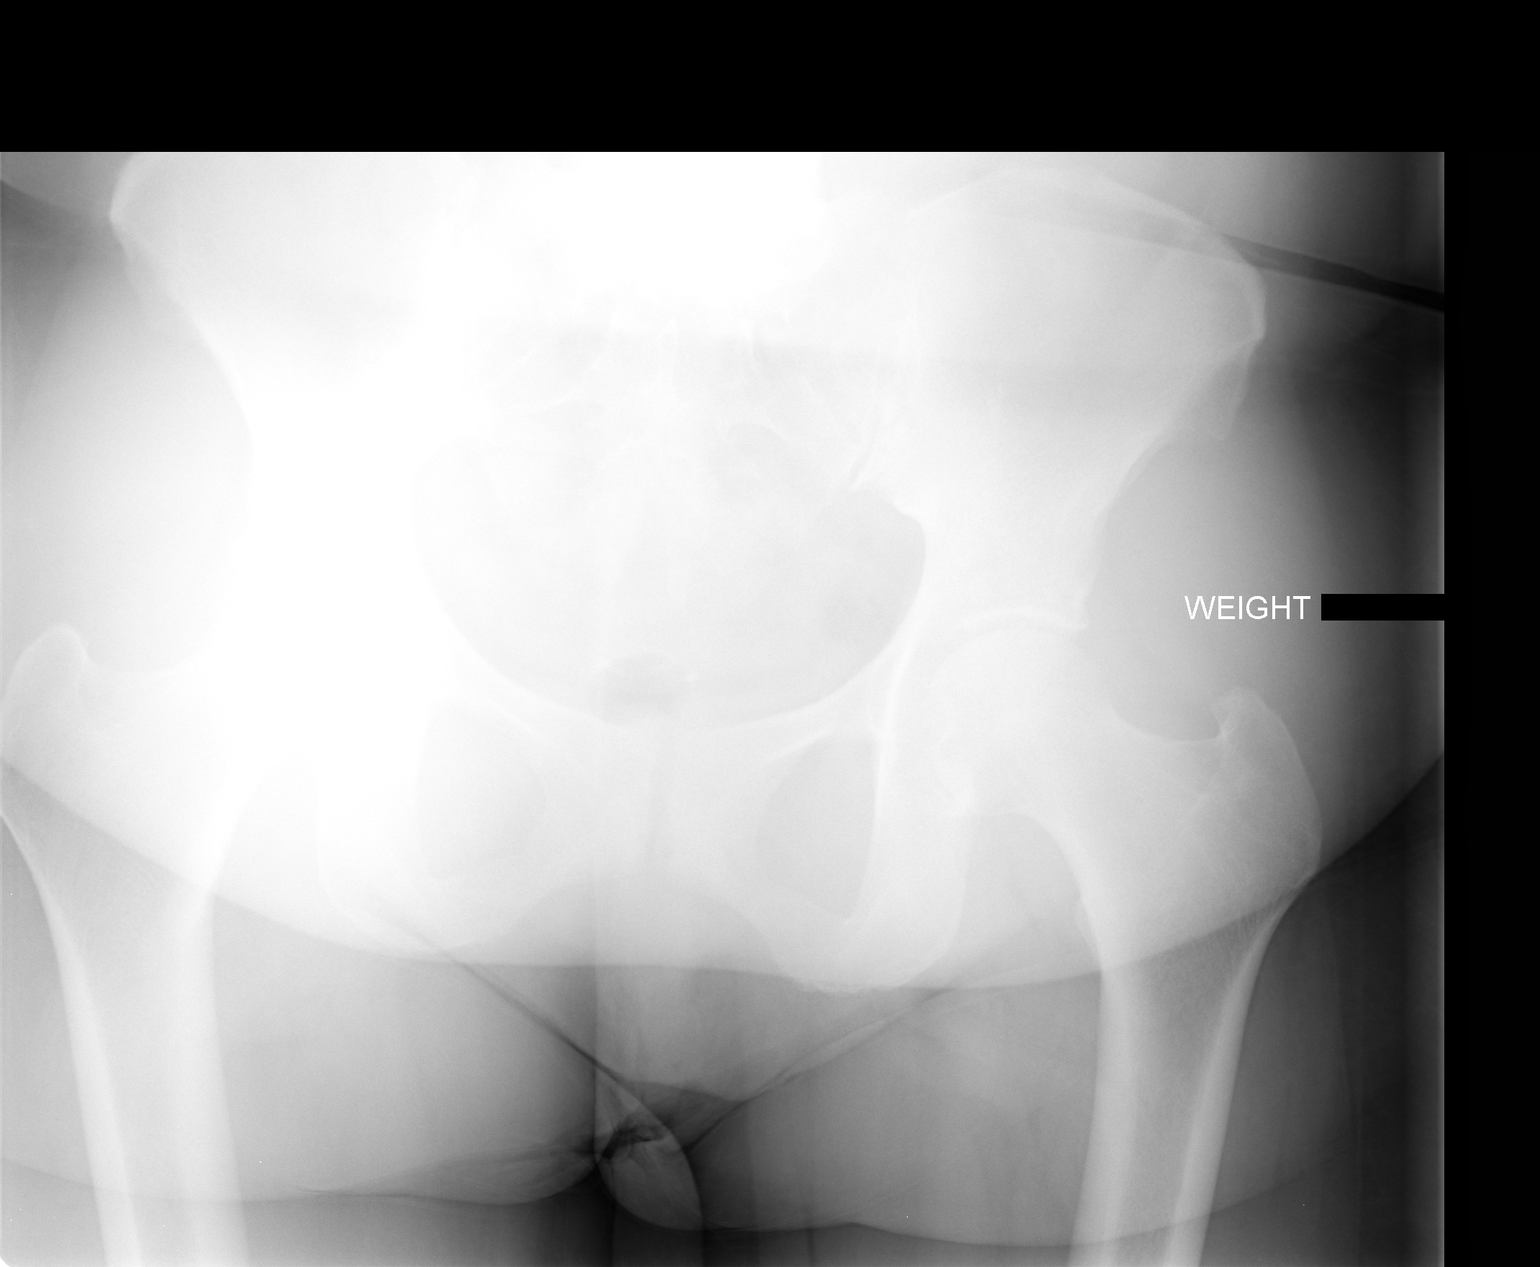

[view not recorded (2 of 3)]
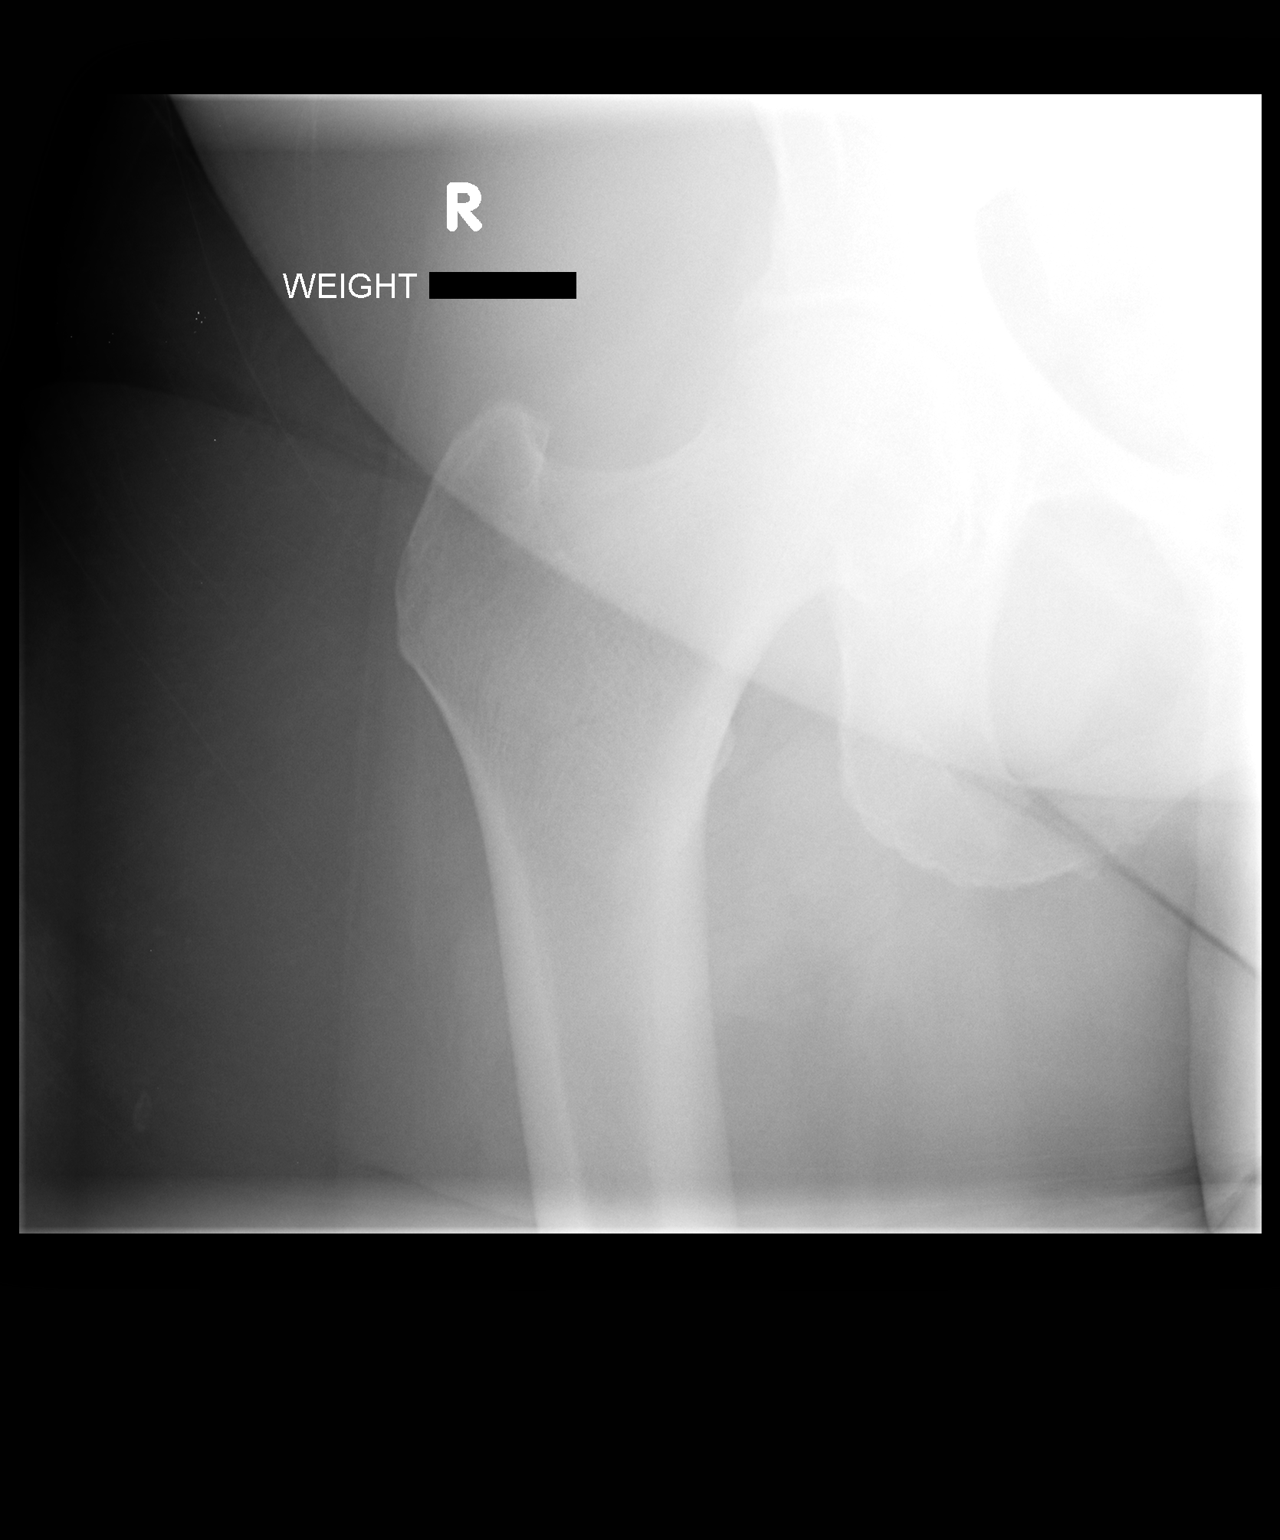

[view not recorded (3 of 3)]
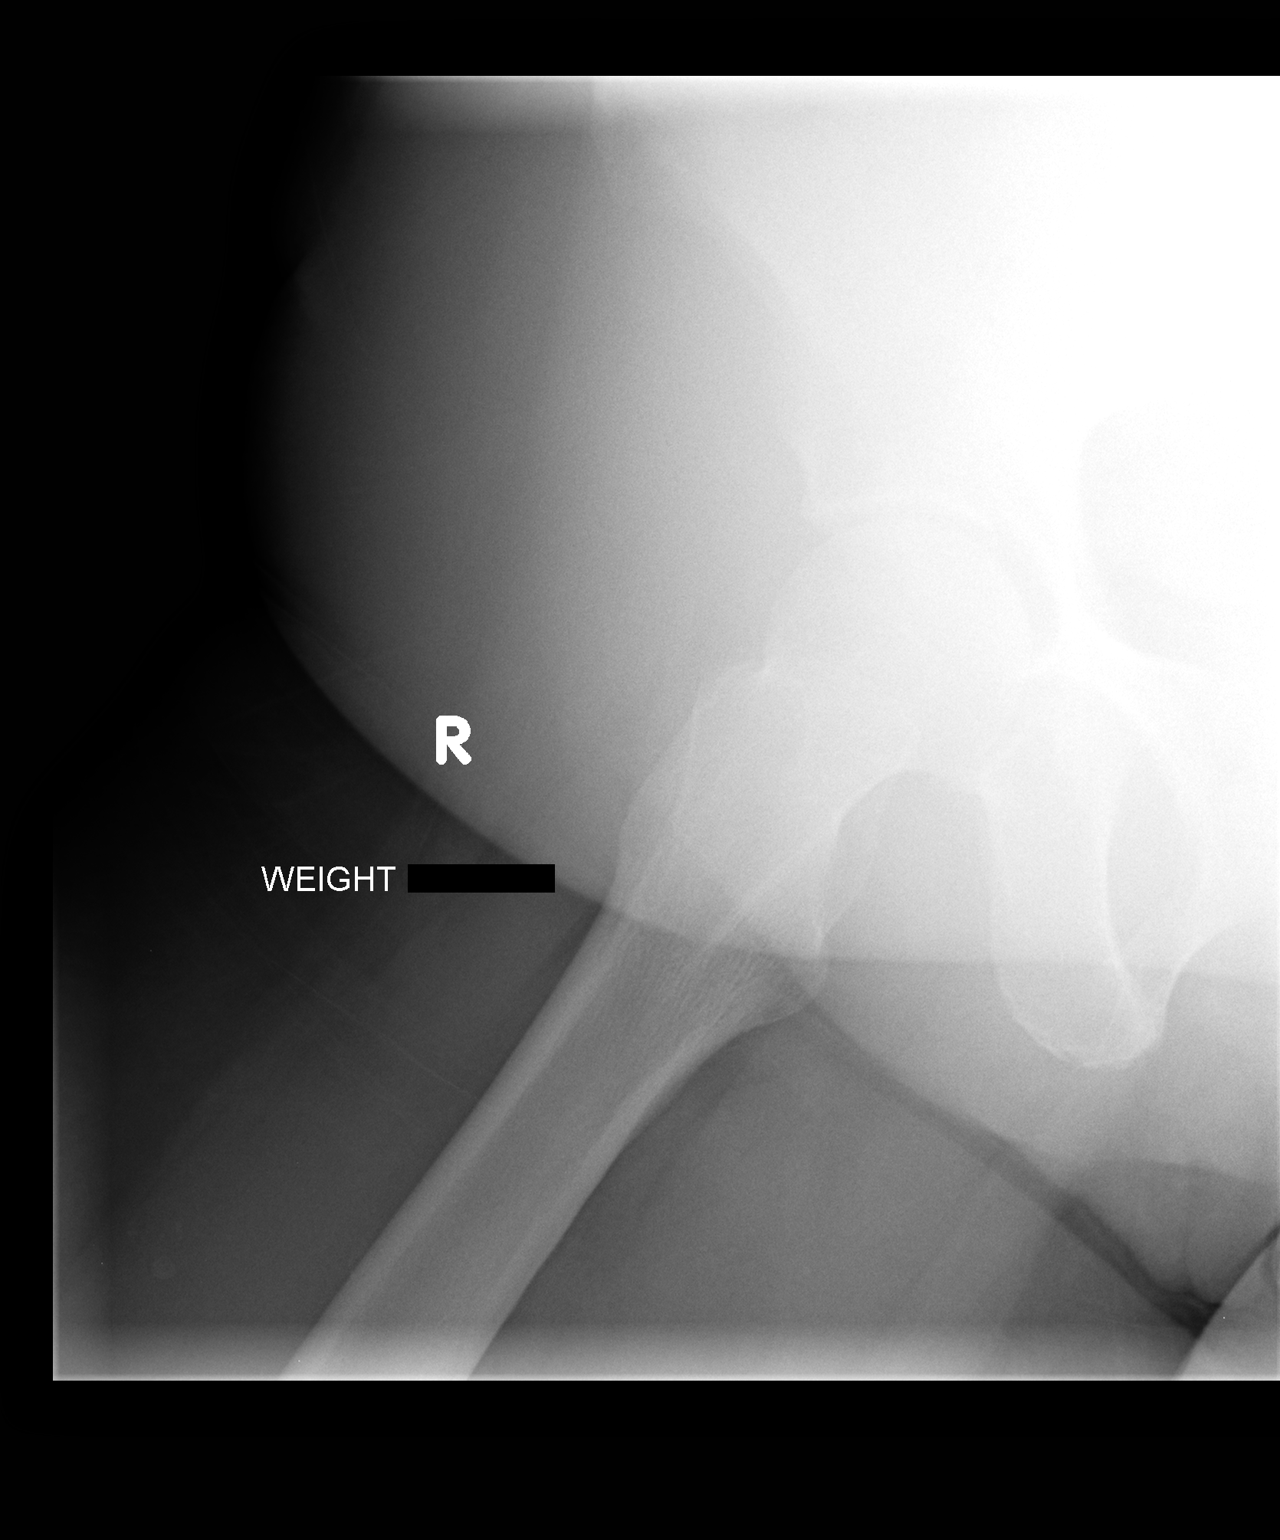

[3 of 3 positions shown; findings below may reference images not displayed]

FINDINGS: Although somewhat limited by body habitus, imaged bones, joints and
soft tissues appear normal. No evidence of arthropathy is seen.
IMPRESSION: Negative exam.

## 2015-05-30 ENCOUNTER — Emergency Department (HOSPITAL_COMMUNITY)
Admission: EM | Admit: 2015-05-30 | Discharge: 2015-05-30 | Disposition: A | Discharge status: Home / Self Care | Payer: BLUE CROSS/BLUE SHIELD | Attending: Emergency Medicine | Admitting: Emergency Medicine

## 2015-05-30 ENCOUNTER — Encounter (HOSPITAL_COMMUNITY): Payer: Self-pay | Admitting: *Deleted

## 2015-05-30 ENCOUNTER — Emergency Department (HOSPITAL_COMMUNITY): Payer: BLUE CROSS/BLUE SHIELD

## 2015-05-30 DIAGNOSIS — Z791 Long term (current) use of non-steroidal anti-inflammatories (NSAID): Secondary | ICD-10-CM | POA: Diagnosis not present

## 2015-05-30 DIAGNOSIS — Z79899 Other long term (current) drug therapy: Secondary | ICD-10-CM | POA: Insufficient documentation

## 2015-05-30 DIAGNOSIS — W01198A Fall on same level from slipping, tripping and stumbling with subsequent striking against other object, initial encounter: Secondary | ICD-10-CM | POA: Insufficient documentation

## 2015-05-30 DIAGNOSIS — T148 Other injury of unspecified body region: Secondary | ICD-10-CM | POA: Diagnosis not present

## 2015-05-30 DIAGNOSIS — Y9289 Other specified places as the place of occurrence of the external cause: Secondary | ICD-10-CM | POA: Insufficient documentation

## 2015-05-30 DIAGNOSIS — Y998 Other external cause status: Secondary | ICD-10-CM | POA: Diagnosis not present

## 2015-05-30 DIAGNOSIS — T148XXA Other injury of unspecified body region, initial encounter: Secondary | ICD-10-CM

## 2015-05-30 DIAGNOSIS — I10 Essential (primary) hypertension: Secondary | ICD-10-CM | POA: Diagnosis not present

## 2015-05-30 DIAGNOSIS — S29001A Unspecified injury of muscle and tendon of front wall of thorax, initial encounter: Secondary | ICD-10-CM | POA: Diagnosis present

## 2015-05-30 DIAGNOSIS — S29011A Strain of muscle and tendon of front wall of thorax, initial encounter: Secondary | ICD-10-CM | POA: Diagnosis not present

## 2015-05-30 DIAGNOSIS — Z23 Encounter for immunization: Secondary | ICD-10-CM | POA: Insufficient documentation

## 2015-05-30 DIAGNOSIS — Y9389 Activity, other specified: Secondary | ICD-10-CM | POA: Insufficient documentation

## 2015-05-30 DIAGNOSIS — W19XXXA Unspecified fall, initial encounter: Secondary | ICD-10-CM

## 2015-05-30 MED ORDER — CYCLOBENZAPRINE HCL 10 MG PO TABS: 10.0000 mg | ORAL_TABLET | Freq: Two times a day (BID) | ORAL | Status: DC | PRN

## 2015-05-30 MED ORDER — KETOROLAC TROMETHAMINE 60 MG/2ML IM SOLN
60.0000 mg | Freq: Once | INTRAMUSCULAR | Status: AC
  Administered 2015-05-30: 60 mg via INTRAMUSCULAR
  Filled 2015-05-30: qty 2

## 2015-05-30 MED ORDER — TETANUS-DIPHTH-ACELL PERTUSSIS 5-2.5-18.5 LF-MCG/0.5 IM SUSP
0.5000 mL | Freq: Once | INTRAMUSCULAR | Status: AC
  Administered 2015-05-30: 0.5 mL via INTRAMUSCULAR
  Filled 2015-05-30: qty 0.5

## 2015-05-30 MED ORDER — TRAMADOL HCL 50 MG PO TABS: 50.0000 mg | ORAL_TABLET | Freq: Four times a day (QID) | ORAL | Status: DC | PRN

## 2015-05-30 NOTE — ED Notes (Signed)
PT will be driving home after DC from hospital.

## 2015-05-30 NOTE — Discharge Instructions (Signed)

## 2015-05-30 NOTE — ED Notes (Signed)
PT reports running back into the house  on [place]. morning tripped fell on to RT side on concrete . Pt now reports pain To the RT rib region around to RT back PT has an abrasion to LT knee. PT denies LOC and did not head.

## 2015-05-30 NOTE — ED Provider Notes (Signed)
CSN: [id number]     Arrival date & time 05/30/15  0801 History   First MD Initiated Contact with Patient 05/30/15 [phone]     Chief Complaint  Patient presents with  . Fall     (Consider location/radiation/quality/duration/timing/severity/associated sxs/prior Treatment) HPI Jamie Jenkins This 55 year old female who presents emergency Department with chief complaint of right chest wall pain after fall. The fall occurred 2 days ago. Patient states that she tripped and fell and skinned her left knee. She also fell onto bilateral outstretched hands. She does not remember falling directly onto her right side. She complains of pain that wraps around the chest wall. It is worse with movement or palpation. She has some pain with deep breathing. She denies any pain with movement of the shoulder. She denies rashes or viral prodrome. Past Medical History  Diagnosis Date  . Gall stones   . Hypertension   . DDD (degenerative disc disease), lumbar    Past Surgical History  Procedure Laterality Date  . Cholecystectomy    . Tubal ligation    . Breast surgery  2012    breast biopsy  . Axillary lymph node dissection Left 11/24/2012    Procedure: incision and drainage axillary abscess;  Surgeon: Adin Hector, MD;  Location: [facility] OR;  Service: General;  Laterality: Left;   Family History  Problem Relation Age of Onset  . Hypertension Father    Social History  Substance Use Topics  . Smoking status: Never Smoker   . Smokeless tobacco: Never Used  . Alcohol Use: No   OB History    No data available     Review of Systems   Ten systems reviewed and are negative for acute change, except as noted in the HPI.   Allergies  Review of patient's allergies indicates no known allergies.  Home Medications   Prior to Admission medications   Medication Sig Start Date End Date Taking? Authorizing Provider  acetaminophen-codeine (TYLENOL #3) 300-30 MG per tablet Take 1 tablet by mouth every 6 (six)  hours as needed for moderate pain. 04/13/14   Golden Circle, FNP  hydrochlorothiazide (HYDRODIURIL) 25 MG tablet Take 1 tablet (25 mg total) by mouth daily. 11/22/14   Golden Circle, FNP  ibuprofen (ADVIL,MOTRIN) 200 MG tablet Take 400 mg by mouth every 6 (six) hours as needed for headache.    Historical Provider, MD  ketorolac (TORADOL) 10 MG tablet Take 2 tablets as needed for headache, may take 1 additional tablet every 4 hours to max 4 tablets. 06/05/14   Golden Circle, FNP  levofloxacin (LEVAQUIN) 500 MG tablet Take 1 tablet (500 mg total) by mouth daily. 08/24/14   Golden Circle, FNP  lisinopril (PRINIVIL,ZESTRIL) 20 MG tablet Take 1 tablet (20 mg total) by mouth daily. 11/22/14   Golden Circle, FNP  meloxicam (MOBIC) 7.5 MG tablet Take 1-2 tablets (7.5-15 mg total) by mouth daily. 05/21/14   Golden Circle, FNP  predniSONE (DELTASONE) 20 MG tablet Take 1 tablet (20 mg total) by mouth daily with breakfast. 08/24/14   Golden Circle, FNP   BP 122/86 mmHg  Pulse 70  Temp(Src) 98.1 F (36.7 C) (Oral)  Resp 16  Ht 5\' 7"  (1.702 m)  Wt 99.791 kg  BMI 34.45 kg/m2  SpO2 100%  LMP 08/23/2012 Physical Exam Physical Exam  Nursing note and vitals reviewed. Constitutional: She is oriented to person, place, and time. She appears well-developed and well-nourished. No distress.  HENT:  Head: Normocephalic and atraumatic.  Eyes: Conjunctivae normal and EOM are normal. Pupils are equal, round, and reactive to light. No scleral icterus.  Neck: Normal range of motion.  Cardiovascular: Normal rate, regular rhythm and normal heart sounds.  Exam reveals no gallop and no friction rub.   No murmur heard. Pulmonary/Chest: Effort normal and breath sounds normal. No respiratory distress.  is tender to palpation along the right chest wall, and right latissimus region. Full range of motion of the right shoulder. No signs of rash. Abdominal: Soft. Bowel sounds are normal. She exhibits no distension and  no mass. There is no tenderness. There is no guarding.  Musculoskeletal: There is a dime-sized abrasion to the left knee. No bony tenderness. Full range of motion. There is no  Neurological: She is alert and oriented to person, place, and time.  Skin: Skin is warm and dry. She is not diaphoretic.    ED Course  Procedures (including critical care time) Labs Review Labs Reviewed - No data to display  Imaging Review No results found. I have personally reviewed and evaluated these images and lab results as part of my medical decision-making.   EKG Interpretation None      MDM   Final diagnoses:  None    Patient with right chest wall pain. Ice to this is secondary to a muscle strain injury.   I doubt shingles eruption.Marland Kitchen She will be discharged with tramadol and muscle relaxer. Advised to follow-up with her primary care physician. Tetanus vaccination updated.   Margarita Mail, PA-C 05/30/15 [id number]  Charlesetta Shanks, MD 05/31/15 1140

## 2015-05-30 NOTE — ED Notes (Signed)
Pt transported to xray 

## 2015-06-06 ENCOUNTER — Ambulatory Visit (INDEPENDENT_AMBULATORY_CARE_PROVIDER_SITE_OTHER): Payer: BLUE CROSS/BLUE SHIELD | Admitting: Family

## 2015-06-06 ENCOUNTER — Encounter: Payer: Self-pay | Admitting: Family

## 2015-06-06 ENCOUNTER — Other Ambulatory Visit (INDEPENDENT_AMBULATORY_CARE_PROVIDER_SITE_OTHER): Payer: BLUE CROSS/BLUE SHIELD

## 2015-06-06 ENCOUNTER — Ambulatory Visit (INDEPENDENT_AMBULATORY_CARE_PROVIDER_SITE_OTHER)
Admission: RE | Admit: 2015-06-06 | Discharge: 2015-06-06 | Disposition: A | Discharge status: Home / Self Care | Payer: BLUE CROSS/BLUE SHIELD | Source: Ambulatory Visit | Attending: Family | Admitting: Family

## 2015-06-06 VITALS — BP 108/82 | HR 72 | Temp 97.5°F | Resp 16 | Ht 66.0 in | Wt 198.0 lb

## 2015-06-06 DIAGNOSIS — S8012XA Contusion of left lower leg, initial encounter: Secondary | ICD-10-CM | POA: Insufficient documentation

## 2015-06-06 DIAGNOSIS — Z0001 Encounter for general adult medical examination with abnormal findings: Secondary | ICD-10-CM | POA: Diagnosis not present

## 2015-06-06 DIAGNOSIS — Z1212 Encounter for screening for malignant neoplasm of rectum: Secondary | ICD-10-CM | POA: Insufficient documentation

## 2015-06-06 DIAGNOSIS — R296 Repeated falls: Secondary | ICD-10-CM | POA: Diagnosis not present

## 2015-06-06 DIAGNOSIS — R55 Syncope and collapse: Secondary | ICD-10-CM | POA: Diagnosis not present

## 2015-06-06 DIAGNOSIS — S8012XD Contusion of left lower leg, subsequent encounter: Secondary | ICD-10-CM | POA: Diagnosis not present

## 2015-06-06 DIAGNOSIS — R6889 Other general symptoms and signs: Secondary | ICD-10-CM

## 2015-06-06 LAB — LIPID PANEL
CHOL/HDL RATIO: 3
CHOLESTEROL: 156 mg/dL (ref 0–200)
HDL: 62.1 mg/dL (ref >39.00)
LDL CALC: 73 mg/dL (ref 0–99)
NonHDL: 93.99
TRIGLYCERIDES: 106 mg/dL (ref 0.0–149.0)
VLDL: 21.2 mg/dL (ref 0.0–40.0)

## 2015-06-06 LAB — CBC
HEMATOCRIT: 42.3 % (ref 36.0–46.0)
HEMOGLOBIN: 14.2 g/dL (ref 12.0–15.0)
MCHC: 33.7 g/dL (ref 30.0–36.0)
MCV: 99.7 fl (ref 78.0–100.0)
PLATELETS: 240 10*3/uL (ref 150.0–400.0)
RBC: 4.24 Mil/uL (ref 3.87–5.11)
RDW: 13.2 % (ref 11.5–15.5)
WBC: 9 10*3/uL (ref 4.0–10.5)

## 2015-06-06 LAB — COMPREHENSIVE METABOLIC PANEL
ALBUMIN: 4.3 g/dL (ref 3.5–5.2)
ALK PHOS: 119 U/L — AB (ref 39–117)
ALT: 15 U/L (ref 0–35)
AST: 18 U/L (ref 0–37)
BUN: 13 mg/dL (ref 6–23)
CALCIUM: 9.8 mg/dL (ref 8.4–10.5)
CHLORIDE: 99 meq/L (ref 96–112)
CO2: 30 mEq/L (ref 19–32)
Creatinine, Ser: 0.98 mg/dL (ref 0.40–1.20)
GFR: 75.82 mL/min (ref >60.00)
Glucose, Bld: 107 mg/dL — ABNORMAL HIGH (ref 70–99)
POTASSIUM: 4 meq/L (ref 3.5–5.1)
SODIUM: 137 meq/L (ref 135–145)
TOTAL PROTEIN: 8.2 g/dL (ref 6.0–8.3)
Total Bilirubin: 0.4 mg/dL (ref 0.2–1.2)

## 2015-06-06 MED ORDER — ACETAMINOPHEN-CODEINE #3 300-30 MG PO TABS: 1.0000 | ORAL_TABLET | Freq: Four times a day (QID) | ORAL | Status: DC | PRN

## 2015-06-06 NOTE — Assessment & Plan Note (Addendum)
1) Anticipatory Guidance: Discussed importance of wearing a seatbelt while driving and not texting while driving; changing batteries in smoke detector at least once annually; wearing suntan lotion when outside; eating a balanced and moderate diet; getting physical activity at least 30 minutes per day.  2) Immunizations / Screenings / Labs:  Declines influenza. All other immunizations are up-to-date per recommendations. Due for a mammogram and Pap smear which she will complete independently through gynecology. Obtain Cologuard for colorectal cancer screening. Obtain hepatitis C antibody for hepatitis C screening. Due for dental exam encouraged to be completed independently. All other screenings are up-to-date per recommendations. Obtain CBC, BMET, Lipid profile and TSH.   Overall well exam with risk factors for cardiovascular disease including hypertension, sedentary lifestyle, and obesity. Hypertension is currently well controlled through medication regimen. Encouraged increasing physical activity with goal of 30 minutes of moderate level activity daily. Discussed importance of nutrition that is varied, balance, and moderate focused on nutrient dense foods and low in saturated fats and processed/sugary foods. Recommended weight loss goal approximate 5-10% of current body weight through lifestyle changes. Continue healthy lifestyle behaviors and choices in place. Follow-up prevention exam in 1 year. Follow-up office visit pending blood work and for chronic conditions as needed.

## 2015-06-06 NOTE — Assessment & Plan Note (Signed)
Presents with multiple falls over the course of the past year with most recent approximately one week ago. Obtain EKG and echocardiogram to rule out cardiac involvement. Cannot rule out vasovagal response or dehydration. Blood work will be evaluated through routine physical blood work. Possible referral for physical therapy for strength and balance pending EKG and echocardiogram results.

## 2015-06-06 NOTE — Patient Instructions (Signed)
Thank you for choosing [facility].  Summary/Instructions:  For your leg:  Alternate ice/heat 2-3 times per day with ice at the end of the day. Elevate your when seated.  Tylenol #3 as needed for pain. Follow up in 3 weeks or sooner if your symptoms worsen or do not improve.   Your prescription(s) have been submitted to your pharmacy or been printed and provided for you. Please take as directed and contact our office if you believe you are having problem(s) with the medication(s) or have any questions.  Please stop by the lab on the basement level of the building for your blood work. Your results will be released to [place] (or called to you) after review, usually within 72 hours after test completion. If any changes need to be made, you will be notified at that same time.  If your symptoms worsen or fail to improve, please contact our office for further instruction, or in case of emergency go directly to the emergency room at the closest medical facility.   Health Maintenance, Female Adopting a healthy lifestyle and getting preventive care can go a long way to promote health and wellness. Talk with your health care provider about what schedule of regular examinations is right for you. This is a good chance for you to check in with your provider about disease prevention and staying healthy. In between checkups, there are plenty of things you can do on your own. Experts have done a lot of research about which lifestyle changes and preventive measures are most likely to keep you healthy. Ask your health care provider for more information. WEIGHT AND DIET  Eat a healthy diet  Be sure to include plenty of vegetables, fruits, low-fat dairy products, and lean protein.  Do not eat a lot of foods high in solid fats, added sugars, or salt.  Get regular exercise. This is one of the most important things you can do for your health.  Most adults should exercise for at least 150 minutes each  week. The exercise should increase your heart rate and make you sweat (moderate-intensity exercise).  Most adults should also do strengthening exercises at least twice a week. This is in addition to the moderate-intensity exercise.  Maintain a healthy weight  Body mass index (BMI) is a measurement that can be used to identify possible weight problems. It estimates body fat based on height and weight. Your health care provider can help determine your BMI and help you achieve or maintain a healthy weight.  For females 63 years of age and older:   A BMI below 18.5 is considered underweight.  A BMI of 18.5 to 24.9 is normal.  A BMI of 25 to 29.9 is considered overweight.  A BMI of 30 and above is considered obese.  Watch levels of cholesterol and blood lipids  You should start having your blood tested for lipids and cholesterol at 55 years of age, then have this test every 5 years.  You may need to have your cholesterol levels checked more often if:  Your lipid or cholesterol levels are high.  You are older than 55 years of age.  You are at high risk for heart disease.  CANCER SCREENING   Lung Cancer  Lung cancer screening is recommended for adults 56-5 years old who are at high risk for lung cancer because of a history of smoking.  A yearly low-dose CT scan of the lungs is recommended for people who:  Currently smoke.  Have quit  within the past 15 years.  Have at least a 30-pack-year history of smoking. A pack year is smoking an average of one pack of cigarettes a day for 1 year.  Yearly screening should continue until it has been 15 years since you quit.  Yearly screening should stop if you develop a health problem that would prevent you from having lung cancer treatment.  Breast Cancer  Practice breast self-awareness. This means understanding how your breasts normally appear and feel.  It also means doing regular breast self-exams. Let your health care provider  know about any changes, no matter how small.  If you are in your 20s or 30s, you should have a clinical breast exam (CBE) by a health care provider every 1-3 years as part of a regular health exam.  If you are 79 or older, have a CBE every year. Also consider having a breast X-ray (mammogram) every year.  If you have a family history of breast cancer, talk to your health care provider about genetic screening.  If you are at high risk for breast cancer, talk to your health care provider about having an MRI and a mammogram every year.  Breast cancer gene (BRCA) assessment is recommended for women who have family members with BRCA-related cancers. BRCA-related cancers include:  Breast.  Ovarian.  Tubal.  Peritoneal cancers.  Results of the assessment will determine the need for genetic counseling and BRCA1 and BRCA2 testing. Cervical Cancer Your health care provider may recommend that you be screened regularly for cancer of the pelvic organs (ovaries, uterus, and vagina). This screening involves a pelvic examination, including checking for microscopic changes to the surface of your cervix (Pap test). You may be encouraged to have this screening done every 3 years, beginning at age 21.  For women ages 34-65, health care providers may recommend pelvic exams and Pap testing every 3 years, or they may recommend the Pap and pelvic exam, combined with testing for human papilloma virus (HPV), every 5 years. Some types of HPV increase your risk of cervical cancer. Testing for HPV may also be done on women of any age with unclear Pap test results.  Other health care providers may not recommend any screening for nonpregnant women who are considered low risk for pelvic cancer and who do not have symptoms. Ask your health care provider if a screening pelvic exam is right for you.  If you have had past treatment for cervical cancer or a condition that could lead to cancer, you need Pap tests and  screening for cancer for at least 20 years after your treatment. If Pap tests have been discontinued, your risk factors (such as having a new sexual partner) need to be reassessed to determine if screening should resume. Some women have medical problems that increase the chance of getting cervical cancer. In these cases, your health care provider may recommend more frequent screening and Pap tests. Colorectal Cancer  This type of cancer can be detected and often prevented.  Routine colorectal cancer screening usually begins at 55 years of age and continues through 55 years of age.  Your health care provider may recommend screening at an earlier age if you have risk factors for colon cancer.  Your health care provider may also recommend using home test kits to check for hidden blood in the stool.  A small camera at the end of a tube can be used to examine your colon directly (sigmoidoscopy or colonoscopy). This is done to check for the  earliest forms of colorectal cancer.  Routine screening usually begins at age 53.  Direct examination of the colon should be repeated every 5-10 years through 55 years of age. However, you may need to be screened more often if early forms of precancerous polyps or small growths are found. Skin Cancer  Check your skin from head to toe regularly.  Tell your health care provider about any new moles or changes in moles, especially if there is a change in a mole's shape or color.  Also tell your health care provider if you have a mole that is larger than the size of a pencil eraser.  Always use sunscreen. Apply sunscreen liberally and repeatedly throughout the day.  Protect yourself by wearing long sleeves, pants, a wide-brimmed hat, and sunglasses whenever you are outside. HEART DISEASE, DIABETES, AND HIGH BLOOD PRESSURE   High blood pressure causes heart disease and increases the risk of stroke. High blood pressure is more likely to develop in:  People who  have blood pressure in the high end of the normal range (130-139/85-89 mm Hg).  People who are overweight or obese.  People who are African American.  If you are 60-75 years of age, have your blood pressure checked every 3-5 years. If you are 60 years of age or older, have your blood pressure checked every year. You should have your blood pressure measured twice--once when you are at a hospital or clinic, and once when you are not at a hospital or clinic. Record the average of the two measurements. To check your blood pressure when you are not at a hospital or clinic, you can use:  An automated blood pressure machine at a pharmacy.  A home blood pressure monitor.  If you are between 43 years and 56 years old, ask your health care provider if you should take aspirin to prevent strokes.  Have regular diabetes screenings. This involves taking a blood sample to check your fasting blood sugar level.  If you are at a normal weight and have a low risk for diabetes, have this test once every three years after 55 years of age.  If you are overweight and have a high risk for diabetes, consider being tested at a younger age or more often. PREVENTING INFECTION  Hepatitis B  If you have a higher risk for hepatitis B, you should be screened for this virus. You are considered at high risk for hepatitis B if:  You were born in a country where hepatitis B is common. Ask your health care provider which countries are considered high risk.  Your parents were born in a high-risk country, and you have not been immunized against hepatitis B (hepatitis B vaccine).  You have HIV or AIDS.  You use needles to inject street drugs.  You live with someone who has hepatitis B.  You have had sex with someone who has hepatitis B.  You get hemodialysis treatment.  You take certain medicines for conditions, including cancer, organ transplantation, and autoimmune conditions. Hepatitis C  Blood testing is  recommended for:  Everyone born from [birth year] through 1965.  Anyone with known risk factors for hepatitis C. Sexually transmitted infections (STIs)  You should be screened for sexually transmitted infections (STIs) including gonorrhea and chlamydia if:  You are sexually active and are younger than 55 years of age.  You are older than 55 years of age and your health care provider tells you that you are at risk for this type of infection.  Your sexual activity has changed since you were last screened and you are at an increased risk for chlamydia or gonorrhea. Ask your health care provider if you are at risk.  If you do not have HIV, but are at risk, it may be recommended that you take a prescription medicine daily to prevent HIV infection. This is called pre-exposure prophylaxis (PrEP). You are considered at risk if:  You are sexually active and do not regularly use condoms or know the HIV status of your partner(s).  You take drugs by injection.  You are sexually active with a partner who has HIV. Talk with your health care provider about whether you are at high risk of being infected with HIV. If you choose to begin PrEP, you should first be tested for HIV. You should then be tested every 3 months for as long as you are taking PrEP.  PREGNANCY   If you are premenopausal and you may become pregnant, ask your health care provider about preconception counseling.  If you may become pregnant, take 400 to 800 micrograms (mcg) of folic acid every day.  If you want to prevent pregnancy, talk to your health care provider about birth control (contraception). OSTEOPOROSIS AND MENOPAUSE   Osteoporosis is a disease in which the bones lose minerals and strength with aging. This can result in serious bone fractures. Your risk for osteoporosis can be identified using a bone density scan.  If you are 41 years of age or older, or if you are at risk for osteoporosis and fractures, ask your health care  provider if you should be screened.  Ask your health care provider whether you should take a calcium or vitamin D supplement to lower your risk for osteoporosis.  Menopause may have certain physical symptoms and risks.  Hormone replacement therapy may reduce some of these symptoms and risks. Talk to your health care provider about whether hormone replacement therapy is right for you.  HOME CARE INSTRUCTIONS   Schedule regular health, dental, and eye exams.  Stay current with your immunizations.   Do not use any tobacco products including cigarettes, chewing tobacco, or electronic cigarettes.  If you are pregnant, do not drink alcohol.  If you are breastfeeding, limit how much and how often you drink alcohol.  Limit alcohol intake to no more than 1 drink per day for nonpregnant women. One drink equals 12 ounces of beer, 5 ounces of wine, or 1 ounces of hard liquor.  Do not use street drugs.  Do not share needles.  Ask your health care provider for help if you need support or information about quitting drugs.  Tell your health care provider if you often feel depressed.  Tell your health care provider if you have ever been abused or do not feel safe at home.   This information is not intended to replace advice given to you by your health care provider. Make sure you discuss any questions you have with your health care provider.   Document Released: 09/15/2010 Document Revised: 03/23/2014 Document Reviewed: 02/01/2013 Elsevier Interactive Patient Education [employer].

## 2015-06-06 NOTE — Assessment & Plan Note (Signed)
Slow healing contusion of the left leg with concern for underlying previous fracture. Obtain x-rays. Continue over the counter medications and start Tylenol #3 as needed for discomfort. Heat/ice and elevation. Follow up pending x-ray results.

## 2015-06-06 NOTE — Assessment & Plan Note (Signed)
Associated symptom of syncope per patient with no significant findings on previous lab work or through physical exam today. In office EKG shows normal sinus rhythm. Obtain 2-D echocardiogram. May require further evaluation, but cannot rule out vasovagal response. Follow-up pending blood work and 2-D echocardiogram.

## 2015-06-06 NOTE — Progress Notes (Signed)
Subjective:    Patient ID: Jamie Jenkins, female    DOB: 1960-10-19, 55 y.o.   MRN: [id number]  Chief Complaint  Patient presents with  . CPE    not fasting, fell back in august and hurt her leg and it is still hurting    HPI:  Jamie Jenkins is a 55 y.o. female who presents today for an annual wellness visit.   1) Health Maintenance -   Diet - Consumes 2 meals per day. Some fruits and vegetables, varied meats and starches. Caffeinated drinks approximately 2 per day.   Exercise - No regular from of exercise.    2) Preventative Exams / Immunizations:  Dental -- Exam past due.  Vision -- Exam due currently.  Prefers to have mammogram scheduled by her gyn.    Health Maintenance  Topic Date Due  . Hepatitis C Screening  06-Sep-1960  . HIV Screening  07/31/1975  . PAP SMEAR  07/30/1981  . COLONOSCOPY  07/31/2010  . MAMMOGRAM  11/03/2012  . INFLUENZA VACCINE  11/29/2015 (Originally 10/15/2014)  . TETANUS/TDAP  05/29/2025    Immunization History  Administered Date(s) Administered  . Tdap 05/30/2015   3.)  Leg pain - Associated symptom of pain located on the lateral aspect of her left leg has been gong on for approximately 7 months following a fall on a cruise ship where she struck her leg. Pain is described as soreness with a severity rated 10/10 to touch. She also noted that associated symptom of multiple of falls within the past year resulting injury with most recent within the past week.    No Known Allergies   Outpatient Prescriptions Prior to Visit  Medication Sig Dispense Refill  . cyclobenzaprine (FLEXERIL) 10 MG tablet Take 1 tablet (10 mg total) by mouth 2 (two) times daily as needed for muscle spasms. 20 tablet 0  . hydrochlorothiazide (HYDRODIURIL) 25 MG tablet Take 1 tablet (25 mg total) by mouth daily. 90 tablet 2  . ibuprofen (ADVIL,MOTRIN) 200 MG tablet Take 600 mg by mouth every 6 (six) hours as needed for headache or mild pain.     Marland Kitchen lisinopril  (PRINIVIL,ZESTRIL) 20 MG tablet Take 1 tablet (20 mg total) by mouth daily. 90 tablet 2  . traMADol (ULTRAM) 50 MG tablet Take 1 tablet (50 mg total) by mouth every 6 (six) hours as needed. 15 tablet 0   No facility-administered medications prior to visit.     Past Medical History  Diagnosis Date  . Gall stones   . Hypertension   . DDD (degenerative disc disease), lumbar      Past Surgical History  Procedure Laterality Date  . Cholecystectomy    . Tubal ligation    . Breast surgery  2012    breast biopsy  . Axillary lymph node dissection Left 11/24/2012    Procedure: incision and drainage axillary abscess;  Surgeon: Adin Hector, MD;  Location: [facility] OR;  Service: General;  Laterality: Left;     Family History  Problem Relation Age of Onset  . Hypertension Father      Social History   Social History  . Marital Status: Married    Spouse Name: N/A  . Number of Children: 3  . Years of Education: 12   Occupational History  . Scientist, research (medical)    Social History Main Topics  . Smoking status: Never Smoker   . Smokeless tobacco: Never Used  . Alcohol Use: No  . Drug Use: No  .  Sexual Activity: Not on file   Other Topics Concern  . Not on file   Social History Narrative   Born and raised in [place], [place]. Currently resides in a house with her husband. No pets. Fun: Go to the beach;    Denies religious beliefs that would effect health care.       Review of Systems  Constitutional: Denies fever, chills, fatigue, or significant weight gain/loss. HENT: Head: Denies headache or neck pain Ears: Denies changes in hearing, ringing in ears, earache, drainage Nose: Denies discharge, stuffiness, itching, nosebleed, sinus pain Throat: Denies sore throat, hoarseness, dry mouth, sores, thrush Eyes: Denies loss/changes in vision, pain, redness, blurry/double vision, flashing lights Cardiovascular: Denies chest pain/discomfort, tightness, palpitations, shortness of  breath with activity, difficulty lying down, swelling, sudden awakening with shortness of breath Respiratory: Denies shortness of breath, cough, sputum production, wheezing Gastrointestinal: Denies dysphasia, heartburn, change in appetite, nausea, change in bowel habits, rectal bleeding, constipation, diarrhea, yellow skin or eyes Genitourinary: Denies frequency, urgency, burning/pain, blood in urine, incontinence, change in urinary strength. Musculoskeletal: Denies muscle/joint pain (other than below), stiffness, back pain, redness or swelling of joints, trauma Positive for left shin pain Skin: Denies rashes, lumps, itching, dryness, color changes, or hair/nail changes Neurological: Denies dizziness, fainting, seizures, weakness, numbness, tingling, tremor Psychiatric - Denies nervousness, stress, depression or memory loss Endocrine: Denies heat or cold intolerance, sweating, frequent urination, excessive thirst, changes in appetite Hematologic: Denies ease of bruising or bleeding     Objective:     BP 108/82 mmHg  Pulse 72  Temp(Src) 97.5 F (36.4 C) (Oral)  Resp 16  Ht 5\' 6"  (1.676 m)  Wt 198 lb (89.812 kg)  BMI 31.97 kg/m2  SpO2 99%  LMP 08/23/2012 Nursing note and vital signs reviewed.  Physical Exam  Constitutional: She is oriented to person, place, and time. She appears well-developed and well-nourished.  HENT:  Head: Normocephalic.  Right Ear: Hearing, tympanic membrane, external ear and ear canal normal.  Left Ear: Hearing, tympanic membrane, external ear and ear canal normal.  Nose: Nose normal.  Mouth/Throat: Uvula is midline, oropharynx is clear and moist and mucous membranes are normal.  Eyes: Conjunctivae and EOM are normal. Pupils are equal, round, and reactive to light.  Neck: Neck supple. No JVD present. No tracheal deviation present. No thyromegaly present.  Cardiovascular: Normal rate, regular rhythm, normal heart sounds and intact distal pulses.     Pulmonary/Chest: Effort normal and breath sounds normal.  Abdominal: Soft. Bowel sounds are normal. She exhibits no distension and no mass. There is no tenderness. There is no rebound and no guarding.  Musculoskeletal: Normal range of motion. She exhibits tenderness. She exhibits no edema.  Lateral aspect of left leg. 7cm X 4 cm  Darkened area at tenderness.  Mild warmth present.  Lymphadenopathy:    She has no cervical adenopathy.  Neurological: She is alert and oriented to person, place, and time. She has normal reflexes. No cranial nerve deficit. She exhibits normal muscle tone. Coordination normal.  Skin: Skin is warm and dry.  Psychiatric: She has a normal mood and affect. Her behavior is normal. Judgment and thought content normal.       Assessment & Plan:   Problem List Items Addressed This Visit      Cardiovascular and Mediastinum   Faintness    Associated symptom of syncope per patient with no significant findings on previous lab work or through physical exam today. In office EKG shows normal sinus  rhythm. Obtain 2-D echocardiogram. May require further evaluation, but cannot rule out vasovagal response. Follow-up pending blood work and 2-D echocardiogram.      Relevant Orders   EKG 12-Lead (Completed)     Other   Encounter for general adult medical examination with abnormal findings - Primary    1) Anticipatory Guidance: Discussed importance of wearing a seatbelt while driving and not texting while driving; changing batteries in smoke detector at least once annually; wearing suntan lotion when outside; eating a balanced and moderate diet; getting physical activity at least 30 minutes per day.  2) Immunizations / Screenings / Labs:  Declines influenza. All other immunizations are up-to-date per recommendations. Due for a mammogram and Pap smear which she will complete independently through gynecology. Obtain Cologuard for colorectal cancer screening. Obtain hepatitis C  antibody for hepatitis C screening. Due for dental exam encouraged to be completed independently. All other screenings are up-to-date per recommendations. Obtain CBC, BMET, Lipid profile and TSH.   Overall well exam with risk factors for cardiovascular disease including hypertension, sedentary lifestyle, and obesity. Hypertension is currently well controlled through medication regimen. Encouraged increasing physical activity with goal of 30 minutes of moderate level activity daily. Discussed importance of nutrition that is varied, balance, and moderate focused on nutrient dense foods and low in saturated fats and processed/sugary foods. Recommended weight loss goal approximate 5-10% of current body weight through lifestyle changes. Continue healthy lifestyle behaviors and choices in place. Follow-up prevention exam in 1 year. Follow-up office visit pending blood work and for chronic conditions as needed.      Relevant Orders   CBC   Comprehensive metabolic panel   Lipid panel   TSH   Hepatitis C antibody   Multiple falls    Presents with multiple falls over the course of the past year with most recent approximately one week ago. Obtain EKG and echocardiogram to rule out cardiac involvement. Cannot rule out vasovagal response or dehydration. Blood work will be evaluated through routine physical blood work. Possible referral for physical therapy for strength and balance pending EKG and echocardiogram results.      Relevant Medications   acetaminophen-codeine (TYLENOL #3) 300-30 MG tablet   Other Relevant Orders   Echocardiogram   Contusion of left leg    Slow healing contusion of the left leg with concern for underlying previous fracture. Obtain x-rays. Continue over the counter medications and start Tylenol #3 as needed for discomfort. Heat/ice and elevation. Follow up pending x-ray results.       Relevant Medications   acetaminophen-codeine (TYLENOL #3) 300-30 MG tablet   Other Relevant  Orders   Echocardiogram   DG Tibia/Fibula Left

## 2015-06-06 NOTE — Progress Notes (Signed)
Pre visit review using our clinic review tool, if applicable. No additional management support is needed unless otherwise documented below in the visit note. 

## 2015-06-07 ENCOUNTER — Telehealth: Payer: Self-pay | Admitting: Family

## 2015-06-07 DIAGNOSIS — M79662 Pain in left lower leg: Secondary | ICD-10-CM

## 2015-06-07 DIAGNOSIS — S8012XD Contusion of left lower leg, subsequent encounter: Secondary | ICD-10-CM

## 2015-06-07 DIAGNOSIS — R296 Repeated falls: Secondary | ICD-10-CM

## 2015-06-07 LAB — HEPATITIS C ANTIBODY: HCV AB: NEGATIVE

## 2015-06-07 LAB — TSH: TSH: 1.35 u[IU]/mL (ref 0.35–4.50)

## 2015-06-07 NOTE — Telephone Encounter (Signed)
Please inform patient that her blood work shows that her kidney function, liver function, electrolytes, thyroid function, white/red blood cells and cholesterol are within the normal limits. Her hepatitis C is negative. Therefore no further action is required at this time and she can plan to follow up in 1 year.

## 2015-06-10 NOTE — Telephone Encounter (Signed)
Pt is aware that all blood work came back but she is wondering what to do about the pain in her leg. She is still having problems and is concerned that she has a blood clot. Please advise

## 2015-06-11 ENCOUNTER — Other Ambulatory Visit: Payer: Self-pay

## 2015-06-11 ENCOUNTER — Ambulatory Visit (HOSPITAL_COMMUNITY): Payer: BLUE CROSS/BLUE SHIELD | Attending: Cardiology

## 2015-06-11 DIAGNOSIS — I119 Hypertensive heart disease without heart failure: Secondary | ICD-10-CM | POA: Insufficient documentation

## 2015-06-11 DIAGNOSIS — I272 Other secondary pulmonary hypertension: Secondary | ICD-10-CM | POA: Diagnosis not present

## 2015-06-11 DIAGNOSIS — R55 Syncope and collapse: Secondary | ICD-10-CM | POA: Insufficient documentation

## 2015-06-11 DIAGNOSIS — R296 Repeated falls: Secondary | ICD-10-CM | POA: Diagnosis not present

## 2015-06-11 DIAGNOSIS — I340 Nonrheumatic mitral (valve) insufficiency: Secondary | ICD-10-CM | POA: Diagnosis not present

## 2015-06-11 DIAGNOSIS — E669 Obesity, unspecified: Secondary | ICD-10-CM | POA: Diagnosis not present

## 2015-06-11 DIAGNOSIS — S8012XD Contusion of left lower leg, subsequent encounter: Secondary | ICD-10-CM | POA: Diagnosis not present

## 2015-06-11 DIAGNOSIS — Z6832 Body mass index (BMI) 32.0-32.9, adult: Secondary | ICD-10-CM | POA: Insufficient documentation

## 2015-06-11 NOTE — Telephone Encounter (Signed)
I will place a d-dimer test for her to check with blood work, although it is unlikely a blood clot.

## 2015-06-12 ENCOUNTER — Telehealth: Payer: Self-pay | Admitting: Family

## 2015-06-12 NOTE — Telephone Encounter (Signed)
Please inform patient that her echocardiogram appeared normal with no explanation for her passing out. It can still possibly be related to what is called vasovagal. The next step would be potential referral to neurology to determine any dysfunction.

## 2015-06-13 NOTE — Telephone Encounter (Signed)
Called pt back to let her know a d-dimer was put in for her to come and get done to check for blood clots.

## 2015-06-13 NOTE — Telephone Encounter (Signed)
Pt called in and said that she would like the nurse to call her back as soon has possible.

## 2015-06-13 NOTE — Telephone Encounter (Signed)
Would like referral to neurology.

## 2015-06-17 ENCOUNTER — Other Ambulatory Visit: Payer: BLUE CROSS/BLUE SHIELD

## 2015-06-17 ENCOUNTER — Other Ambulatory Visit: Payer: Self-pay | Admitting: Orthopedic Surgery

## 2015-06-17 DIAGNOSIS — M79662 Pain in left lower leg: Secondary | ICD-10-CM

## 2015-06-17 DIAGNOSIS — Z0189 Encounter for other specified special examinations: Principal | ICD-10-CM

## 2015-06-17 DIAGNOSIS — Z789 Other specified health status: Secondary | ICD-10-CM

## 2015-06-17 NOTE — Telephone Encounter (Signed)
Referral placed.

## 2015-06-18 ENCOUNTER — Telehealth: Payer: Self-pay | Admitting: Family

## 2015-06-18 LAB — D-DIMER, QUANTITATIVE: D-Dimer, Quant: 0.22 ug/mL-FEU (ref 0.00–0.48)

## 2015-06-18 NOTE — Telephone Encounter (Signed)
Please inform patient that her d-dimer which a potential measure for blood clots is negative.

## 2015-06-18 NOTE — Telephone Encounter (Signed)
LVM letting pt know.  

## 2015-07-15 ENCOUNTER — Ambulatory Visit (INDEPENDENT_AMBULATORY_CARE_PROVIDER_SITE_OTHER): Payer: BLUE CROSS/BLUE SHIELD | Admitting: Neurology

## 2015-07-15 ENCOUNTER — Encounter: Payer: Self-pay | Admitting: Neurology

## 2015-07-15 VITALS — BP 100/68 | HR 66 | Ht 66.0 in | Wt 194.5 lb

## 2015-07-15 DIAGNOSIS — R6889 Other general symptoms and signs: Secondary | ICD-10-CM

## 2015-07-15 DIAGNOSIS — IMO0001 Reserved for inherently not codable concepts without codable children: Secondary | ICD-10-CM

## 2015-07-15 DIAGNOSIS — R55 Syncope and collapse: Secondary | ICD-10-CM

## 2015-07-15 NOTE — Progress Notes (Signed)
[facility] Clinic Note - Initial Visit   Date: 07/15/2015  IDY JAGIELLO MRN: [id number] DOB: 11/25/1960   Dear Jamie Po, NP:  Thank you for your kind referral of Jamie Jenkins for consultation of left leg pain. Although her history is well known to you, please allow Jamie Jenkins to reiterate it for the purpose of our medical record. The patient was accompanied to the clinic by self.    History of Present Illness: Jamie Jenkins is a 55 y.o. right-handed African American female with hypertension presenting for evaluation of syncopal spells and left leg pain.    Patient reports having three spells of brief loss of consciousness:  (1) Her first fall occurred about 10 years ago while cooking associated with loss of consciousness for 3-4 seconds.  She collapsed to the ground and when she woke up, she was alarmed but not confused.  (2) She was on a [place] cruise in August 2016 when she was climbing the stairs and again lost consciousness for 3-4 seconds.  She injured her left leg and has persistent left lower leg achy pain.  There is no associated numbness/tingling, or weakness.  (3) The third event occurred while walking in her neighbors driveway and she fell forward and bruised her ribs.     Denies preceding lightheadedness, tunnel vision, or palpitation.  No associated tongue biting, incontinence, or limb shaking.  There is no confusion that follows these events.  She saw her PCP for this who ordered echocardiogram, which returned normal.    She endorses drinking only two glasses of water daily.   No personal or family history of seizure disorder.  Out-side paper records, electronic medical record, and images have been reviewed where available and summarized as:  Lab Results  Component Value Date   TSH 1.35 06/06/2015     Past Medical History  Diagnosis Date  . Gall stones   . Hypertension   . DDD (degenerative disc disease), lumbar     Past Surgical  History  Procedure Laterality Date  . Cholecystectomy    . Tubal ligation    . Breast surgery  2012    breast biopsy  . Axillary lymph node dissection Left 11/24/2012    Procedure: incision and drainage axillary abscess;  Surgeon: Adin Hector, MD;  Location: [facility];  Service: General;  Laterality: Left;     Medications:  Outpatient Encounter Prescriptions as of 07/15/2015  Medication Sig  . acetaminophen-codeine (TYLENOL #3) 300-30 MG tablet Take 1 tablet by mouth every 6 (six) hours as needed for moderate pain.  . cyclobenzaprine (FLEXERIL) 10 MG tablet Take 1 tablet (10 mg total) by mouth 2 (two) times daily as needed for muscle spasms.  . hydrochlorothiazide (HYDRODIURIL) 25 MG tablet Take 1 tablet (25 mg total) by mouth daily.  Marland Kitchen ibuprofen (ADVIL,MOTRIN) 200 MG tablet Take 600 mg by mouth every 6 (six) hours as needed for headache or mild pain.   Marland Kitchen lisinopril (PRINIVIL,ZESTRIL) 20 MG tablet Take 1 tablet (20 mg total) by mouth daily.  . traMADol (ULTRAM) 50 MG tablet Take 1 tablet (50 mg total) by mouth every 6 (six) hours as needed.   No facility-administered encounter medications on file as of 07/15/2015.     Allergies: No Known Allergies  Family History: Family History  Problem Relation Age of Onset  . Hypertension Father   . Pancreatic cancer Mother     Deceased  . Deep vein thrombosis Brother     Deceased  .  Healthy Sister   . Healthy Son   . Healthy Daughter     Social History: Social History  Substance Use Topics  . Smoking status: Never Smoker   . Smokeless tobacco: Never Used  . Alcohol Use: No   Social History   Social History Narrative   Born and raised in [place], [place]. Currently resides in a house with her husband in a one story home. No pets. Fun: Go to the beach;    Denies religious beliefs that would effect health care.    Has 3 children.    Works as a Nurse, learning disability.  Education: some college.       Review of Systems:  CONSTITUTIONAL: No  fevers, chills, night sweats, or weight loss.   EYES: No visual changes or eye pain ENT: No hearing changes.  No history of nose bleeds.   RESPIRATORY: No cough, wheezing and shortness of breath.   CARDIOVASCULAR: Negative for chest pain, and palpitations.   GI: Negative for abdominal discomfort, blood in stools or black stools.  No recent change in bowel habits.   GU:  No history of incontinence.   MUSCLOSKELETAL: No history of joint pain or swelling.  No myalgias.   SKIN: Negative for lesions, rash, and itching.   HEMATOLOGY/ONCOLOGY: Negative for prolonged bleeding, bruising easily, and swollen nodes.  No history of cancer.   ENDOCRINE: Negative for cold or heat intolerance, polydipsia or goiter.   PSYCH:  No depression or anxiety symptoms.   NEURO: As Above.   Vital Signs:  BP 100/68 mmHg  Pulse 66  Ht 5\' 6"  (1.676 m)  Wt 194 lb 8 oz (88.225 kg)  BMI 31.41 kg/m2  SpO2 99%  LMP 08/23/2012 Orthostatic VS for the past 24 hrs:  BP- Lying Pulse- Lying BP- Sitting Pulse- Sitting BP- Standing at 0 minutes Pulse- Standing at 0 minutes  07/15/15 1446 110/74 mmHg 68 104/74 mmHg 71 98/68 mmHg 73    General Medical Exam:   General:  Well appearing, comfortable.   Eyes/ENT: see cranial nerve examination.   Neck: No masses appreciated.  Full range of motion without tenderness.  No carotid bruits. Respiratory:  Clear to auscultation, good air entry bilaterally.   Cardiac:  Regular rate and rhythm, no murmur.   Extremities:  No deformities, edema, or skin discoloration.  Skin:  No rashes or lesions.  Neurological Exam: MENTAL STATUS including orientation to time, place, person, recent and remote memory, attention span and concentration, language, and fund of knowledge is normal.  Speech is not dysarthric.  CRANIAL NERVES: II:  No visual field defects.  Unremarkable fundi.   III-IV-VI: Pupils equal round and reactive to light.  Normal conjugate, extra-ocular eye movements in all  directions of gaze.  No nystagmus.  No ptosis.   V:  Normal facial sensation.     VII:  Normal facial symmetry and movements.   VIII:  Normal hearing and vestibular function.   IX-X:  Normal palatal movement.   XI:  Normal shoulder shrug and head rotation.   XII:  Normal tongue strength and range of motion, no deviation or fasciculation.  MOTOR:  No atrophy, fasciculations or abnormal movements.  No pronator drift.  Tone is normal.    Right Upper Extremity:    Left Upper Extremity:    Deltoid  5/5   Deltoid  5/5   Biceps  5/5   Biceps  5/5   Triceps  5/5   Triceps  5/5   Wrist extensors  5/5   Wrist extensors  5/5   Wrist flexors  5/5   Wrist flexors  5/5   Finger extensors  5/5   Finger extensors  5/5   Finger flexors  5/5   Finger flexors  5/5   Dorsal interossei  5/5   Dorsal interossei  5/5   Abductor pollicis  5/5   Abductor pollicis  5/5   Tone (Ashworth scale)  0  Tone (Ashworth scale)  0   Right Lower Extremity:    Left Lower Extremity:    Hip flexors  5/5   Hip flexors  5/5   Hip extensors  5/5   Hip extensors  5/5   Knee flexors  5/5   Knee flexors  5/5   Knee extensors  5/5   Knee extensors  5/5   Dorsiflexors  5/5   Dorsiflexors  5/5   Plantarflexors  5/5   Plantarflexors  5/5   Toe extensors  5/5   Toe extensors  5/5   Toe flexors  5/5   Toe flexors  5/5   Tone (Ashworth scale)  0  Tone (Ashworth scale)  0   MSRs:  Right                                                                 Left brachioradialis 2+  brachioradialis 2+  biceps 2+  biceps 2+  triceps 2+  triceps 2+  patellar 2+  patellar 2+  ankle jerk 1+  ankle jerk 1+  Hoffman no  Hoffman no  plantar response down  plantar response down   SENSORY: Patch sensory loss over the left lower leg to pin prick only.  Romberg's sign absent.   COORDINATION/GAIT: Normal finger-to- nose-finger and heel-to-shin.  Intact rapid alternating movements bilaterally.  Able to rise from a chair without using arms.  Gait  narrow based and stable. Mild unsteadiness with tandem and stressed gait intact.    IMPRESSION: Mrs. Seery is a 55 year-old female referred for evaluation of syncopal spells, last 3-4 seconds.  There is no associated features to suggest these are seizure-related events and most likely vasovagal syncope.  She does not stay hydrated and did have 12 point drop in his SBP when othostatic vital signs were checked.  For completeness, I will check rooutine EEG and Jamie Jenkins carotids.  With her essentially normal exam, MRI brain is not indicated unless there are changes on her EEG.    Regarding her left lower leg discomfort, the nature of her symptoms are more suggestive of musculoskeletal pain, and less likely a radiculopathy.  NCS/EMG was declined.  [place] driving laws were discussed with the patient that state to stop driving after an episode of loss of consciousness, until 6 months event-free.  Return to clinic in 3 months.   The duration of this appointment visit was 35 minutes of face-to-face time with the patient.  Greater than 50% of this time was spent in counseling, explanation of diagnosis, planning of further management, and coordination of care.   Thank you for allowing me to participate in patient's care.  If I can answer any additional questions, I would be pleased to do so.    Sincerely,    Donika K. Posey Pronto, DO

## 2015-07-15 NOTE — Patient Instructions (Addendum)
1.  Routine EEG 2.  US carotids 3.  Start drinking at least 8 glasses daily  Return to clinic in 3 months

## 2015-07-16 ENCOUNTER — Other Ambulatory Visit: Payer: Self-pay | Admitting: *Deleted

## 2015-07-16 DIAGNOSIS — IMO0001 Reserved for inherently not codable concepts without codable children: Secondary | ICD-10-CM

## 2015-07-16 DIAGNOSIS — R55 Syncope and collapse: Secondary | ICD-10-CM

## 2015-07-16 DIAGNOSIS — R6889 Other general symptoms and signs: Principal | ICD-10-CM

## 2015-07-19 ENCOUNTER — Ambulatory Visit (HOSPITAL_COMMUNITY)
Admission: RE | Admit: 2015-07-19 | Discharge: 2015-07-19 | Disposition: A | Discharge status: Home / Self Care | Payer: BLUE CROSS/BLUE SHIELD | Source: Ambulatory Visit | Attending: Cardiovascular Disease | Admitting: Cardiovascular Disease

## 2015-07-19 DIAGNOSIS — R6889 Other general symptoms and signs: Secondary | ICD-10-CM | POA: Insufficient documentation

## 2015-07-19 DIAGNOSIS — R55 Syncope and collapse: Secondary | ICD-10-CM | POA: Insufficient documentation

## 2015-07-19 DIAGNOSIS — I10 Essential (primary) hypertension: Secondary | ICD-10-CM | POA: Insufficient documentation

## 2015-07-19 DIAGNOSIS — IMO0001 Reserved for inherently not codable concepts without codable children: Secondary | ICD-10-CM

## 2015-07-24 ENCOUNTER — Ambulatory Visit (INDEPENDENT_AMBULATORY_CARE_PROVIDER_SITE_OTHER): Payer: BLUE CROSS/BLUE SHIELD | Admitting: Neurology

## 2015-07-24 DIAGNOSIS — R6889 Other general symptoms and signs: Secondary | ICD-10-CM

## 2015-07-24 DIAGNOSIS — R55 Syncope and collapse: Secondary | ICD-10-CM

## 2015-07-24 DIAGNOSIS — IMO0001 Reserved for inherently not codable concepts without codable children: Secondary | ICD-10-CM

## 2015-07-25 NOTE — Procedures (Signed)
ELECTROENCEPHALOGRAM REPORT  Date of Study: 07/24/2015  Patient's Name: Jamie Jenkins MRN: [id number] Date of Birth: 10-Sep-1960  Referring Provider: Narda Amber, DO  Indication: Syncopal spells  Medications: Tylenol #3, Flexeril, Hydrodiuril, Advil, Motrin, Lisinopril, Ultram  Technical Summary: This is a multichannel digital EEG recording, using the international 10-20 placement system with electrodes applied with paste and impedances below 5000 ohms.    Description: The EEG background is symmetric, with a well-developed posterior dominant rhythm of 12 Hz, which is reactive to eye opening and closing.  There is some mild excess beta activity seen.  No focal abnormalities are seen.  No focal or generalized epileptiform discharges are seen.  Stage II sleep is seen, with normal and symmetric sleep patterns.  Hyperventilation and photic stimulation were performed, and produced no abnormalities.  ECG revealed normal cardiac rate and rhythm.  Impression: This is an essentially normal EEG of the awake and asleep states.  There are no focal or lateralizing features, although there is some mild excess beta activity which is typically due to pharmacologic effect.  Adam R. Tomi Likens, DO

## 2015-10-25 ENCOUNTER — Ambulatory Visit: Payer: BLUE CROSS/BLUE SHIELD | Admitting: Neurology

## 2015-12-09 ENCOUNTER — Other Ambulatory Visit: Payer: Self-pay | Admitting: Family

## 2015-12-10 NOTE — Telephone Encounter (Signed)
Pt came by and wanted to check on the status of this refill that was sent in.  She said that she only has 1 pill left

## 2015-12-10 NOTE — Telephone Encounter (Signed)
Notified pt refills has been sent to [facility]...Jamie Jenkins

## 2016-01-06 ENCOUNTER — Ambulatory Visit: Payer: Self-pay

## 2016-01-06 ENCOUNTER — Other Ambulatory Visit: Payer: Self-pay | Admitting: Occupational Medicine

## 2016-01-06 ENCOUNTER — Ambulatory Visit (HOSPITAL_COMMUNITY)
Admission: EM | Admit: 2016-01-06 | Discharge: 2016-01-06 | Disposition: A | Discharge status: Other Acute Inpatient Hospital | Payer: BLUE CROSS/BLUE SHIELD

## 2016-01-06 DIAGNOSIS — S80851A Superficial foreign body, right lower leg, initial encounter: Secondary | ICD-10-CM

## 2016-04-07 ENCOUNTER — Encounter (HOSPITAL_COMMUNITY): Payer: Self-pay | Admitting: Emergency Medicine

## 2016-04-07 ENCOUNTER — Emergency Department (HOSPITAL_COMMUNITY)
Admission: EM | Admit: 2016-04-07 | Discharge: 2016-04-07 | Disposition: A | Discharge status: Home / Self Care | Payer: BLUE CROSS/BLUE SHIELD | Attending: Emergency Medicine | Admitting: Emergency Medicine

## 2016-04-07 ENCOUNTER — Emergency Department (HOSPITAL_COMMUNITY): Payer: BLUE CROSS/BLUE SHIELD

## 2016-04-07 DIAGNOSIS — R197 Diarrhea, unspecified: Secondary | ICD-10-CM | POA: Insufficient documentation

## 2016-04-07 DIAGNOSIS — R6889 Other general symptoms and signs: Secondary | ICD-10-CM

## 2016-04-07 DIAGNOSIS — R079 Chest pain, unspecified: Secondary | ICD-10-CM | POA: Diagnosis present

## 2016-04-07 DIAGNOSIS — R112 Nausea with vomiting, unspecified: Secondary | ICD-10-CM | POA: Diagnosis not present

## 2016-04-07 DIAGNOSIS — I10 Essential (primary) hypertension: Secondary | ICD-10-CM | POA: Diagnosis not present

## 2016-04-07 LAB — CBC
HCT: 46.9 % — ABNORMAL HIGH (ref 36.0–46.0)
Hemoglobin: 16.5 g/dL — ABNORMAL HIGH (ref 12.0–15.0)
MCH: 34.2 pg — ABNORMAL HIGH (ref 26.0–34.0)
MCHC: 35.2 g/dL (ref 30.0–36.0)
MCV: 97.1 fL (ref 78.0–100.0)
Platelets: 187 10*3/uL (ref 150–400)
RBC: 4.83 MIL/uL (ref 3.87–5.11)
RDW: 12.7 % (ref 11.5–15.5)
WBC: 5.2 10*3/uL (ref 4.0–10.5)

## 2016-04-07 LAB — BASIC METABOLIC PANEL
Anion gap: 12 (ref 5–15)
BUN: 14 mg/dL (ref 6–20)
CO2: 23 mmol/L (ref 22–32)
Calcium: 9 mg/dL (ref 8.9–10.3)
Chloride: 99 mmol/L — ABNORMAL LOW (ref 101–111)
Creatinine, Ser: 1.27 mg/dL — ABNORMAL HIGH (ref 0.44–1.00)
GFR calc Af Amer: 54 mL/min — ABNORMAL LOW (ref >60)
GFR calc non Af Amer: 47 mL/min — ABNORMAL LOW (ref >60)
Glucose, Bld: 106 mg/dL — ABNORMAL HIGH (ref 65–99)
Potassium: 3.7 mmol/L (ref 3.5–5.1)
Sodium: 134 mmol/L — ABNORMAL LOW (ref 135–145)

## 2016-04-07 LAB — I-STAT TROPONIN, ED: Troponin i, poc: 0 ng/mL (ref 0.00–0.08)

## 2016-04-07 MED ORDER — OSELTAMIVIR PHOSPHATE 30 MG PO CAPS: 30.0000 mg | ORAL_CAPSULE | Freq: Two times a day (BID) | ORAL | 0 refills | Status: DC

## 2016-04-07 MED ORDER — KETOROLAC TROMETHAMINE 30 MG/ML IJ SOLN
30.0000 mg | Freq: Once | INTRAMUSCULAR | Status: AC
  Administered 2016-04-07: 30 mg via INTRAMUSCULAR
  Filled 2016-04-07: qty 1

## 2016-04-07 NOTE — ED Triage Notes (Signed)
Per patient, states chest congestion, cough, and body aches since Sunday-thinks she has the flu

## 2016-04-07 NOTE — ED Notes (Signed)
Pt A&OX4, ambulatory at d/c with steady gait

## 2016-04-07 NOTE — ED Provider Notes (Signed)
[facility] DEPT Provider Note   CSN: [id number] Arrival date & time: 04/07/16  [id number]  By signing my name below, I, Sonum Patel, attest that this documentation has been prepared under the direction and in the presence of Virgel Manifold, MD. Electronically Signed: Sonum Patel, Education administrator. 04/07/16. 10:45 AM.  History   Chief Complaint Chief Complaint  Patient presents with  . URI    The history is provided by the patient. No language interpreter was used.     HPI Comments: Jamie Jenkins is a 56 y.o. female who presents to the Emergency Department complaining of 3 days of persistent cough with associated chest pain and generalized body aches. She also complains of nausea, vomiting, and diarrhea that occurred 2 days ago. She has tried Mucinex, Thera-flu, and Alka-Seltzer without relief. She reports sick contacts with the flu at home. She denies sore throat, urinary symptoms.   Past Medical History:  Diagnosis Date  . DDD (degenerative disc disease), lumbar   . Gall stones   . Hypertension     Patient Active Problem List   Diagnosis Date Noted  . Encounter for general adult medical examination with abnormal findings 06/06/2015  . Multiple falls 06/06/2015  . Contusion of left leg 06/06/2015  . Faintness 06/06/2015  . Cough 08/24/2014  . DDD (degenerative disc disease), lumbar 05/21/2014  . Essential hypertension 05/21/2014  . Right leg pain 04/02/2014  . Generalized headaches 04/02/2014  . Abscess of axilla, left 11/21/2012    Past Surgical History:  Procedure Laterality Date  . AXILLARY LYMPH NODE DISSECTION Left 11/24/2012   Procedure: incision and drainage axillary abscess;  Surgeon: Adin Hector, MD;  Location: [facility];  Service: General;  Laterality: Left;  . BREAST SURGERY  2012   breast biopsy  . CHOLECYSTECTOMY    . TUBAL LIGATION      OB History    No data available       Home Medications    Prior to Admission medications   Medication Sig Start Date End  Date Taking? Authorizing Provider  hydrochlorothiazide (HYDRODIURIL) 25 MG tablet take 1 tablet by mouth once daily Patient taking differently: Take 25 mg by mouth each morning 12/10/15  Yes Golden Circle, FNP  lisinopril (PRINIVIL,ZESTRIL) 20 MG tablet take 1 tablet by mouth once daily Patient taking differently: Take 20 mg by mouth each morning 12/10/15  Yes Golden Circle, FNP    Family History Family History  Problem Relation Age of Onset  . Pancreatic cancer Mother     Deceased  . Hypertension Father   . Deep vein thrombosis Brother     Deceased  . Healthy Sister   . Healthy Son   . Healthy Daughter     Social History Social History  Substance Use Topics  . Smoking status: Never Smoker  . Smokeless tobacco: Never Used  . Alcohol use No     Allergies   Patient has no known allergies.   Review of Systems Review of Systems  A complete 10 system review of systems was obtained and all systems are negative except as noted in the HPI and PMH.    Physical Exam Updated Vital Signs BP 111/64   Pulse 71   Temp 98.6 F (37 C) (Oral)   Resp 14   Ht 5\' 7"  (1.702 m)   Wt 210 lb (95.3 kg)   LMP 08/23/2012   SpO2 100%   BMI 32.89 kg/m   Physical Exam  Constitutional: She is oriented  to person, place, and time. She appears well-developed and well-nourished. No distress.  HENT:  Head: Normocephalic and atraumatic.  Eyes: EOM are normal.  Neck: Normal range of motion.  Cardiovascular: Normal rate, regular rhythm and normal heart sounds.   Pulmonary/Chest: Effort normal and breath sounds normal.  Coughing frequently   Abdominal: Soft. She exhibits no distension. There is no tenderness.  Musculoskeletal: Normal range of motion.  Neurological: She is alert and oriented to person, place, and time.  Skin: Skin is warm and dry.  Psychiatric: She has a normal mood and affect. Judgment normal.  Nursing note and vitals reviewed.    ED Treatments / Results  DIAGNOSTIC  STUDIES: Oxygen Saturation is 97% on RA, adequate by my interpretation.    COORDINATION OF CARE: 10:46 AM Discussed treatment plan with pt at bedside and pt agreed to plan.   Labs (all labs ordered are listed, but only abnormal results are displayed) Labs Reviewed  BASIC METABOLIC PANEL - Abnormal; Notable for the following:       Result Value   Sodium 134 (*)    Chloride 99 (*)    Glucose, Bld 106 (*)    Creatinine, Ser 1.27 (*)    GFR calc non Af Amer 47 (*)    GFR calc Af Amer 54 (*)    All other components within normal limits  CBC - Abnormal; Notable for the following:    Hemoglobin 16.5 (*)    HCT 46.9 (*)    MCH 34.2 (*)    All other components within normal limits  I-STAT TROPOININ, ED    EKG  EKG Interpretation None       Radiology Dg Chest 2 View  Result Date: 04/07/2016 CLINICAL DATA:  SOB, cough, congestion, central chest pains, body aches, fever all for 1 week. Pt takes HTN meds. Not diabetic. Smoker. No hx of asthma. EXAM: CHEST  2 VIEW COMPARISON:  05/30/2015 FINDINGS: 9 mm nodular density projecting over the left lung apex, ECG lead versus true pulmonary nodule. Cardiac and mediastinal margins appear normal. The lungs appear otherwise clear. No pleural effusion. IMPRESSION: 1. 9 mm nodular density projects of the left upper lobe. This is probably a radiolucent ECG lead. If there is no ECG lead in this location on the patient, then this could be a true pulmonary nodule and CT chest would be advised. Electronically Signed   By: Van Clines M.D.   On: 04/07/2016 09:17    Procedures Procedures (including critical care time)  Medications Ordered in ED Medications - No data to display   Initial Impression / Assessment and Plan / ED Course  I have reviewed the triage vital signs and the nursing notes.  Pertinent labs & imaging results that were available during my care of the patient were reviewed by me and considered in my medical decision making (see  chart for details).      Final Clinical Impressions(s) / ED Diagnoses   Final diagnoses:  Flu-like symptoms    New Prescriptions New Prescriptions   No medications on file   I personally preformed the services scribed in my presence. The recorded information has been reviewed is accurate. Virgel Manifold, MD.    Virgel Manifold, MD 04/20/16 [phone]

## 2016-04-07 NOTE — ED Notes (Signed)
Pt ambulatory to room with independent steady gait, NAD 

## 2016-06-08 ENCOUNTER — Ambulatory Visit (INDEPENDENT_AMBULATORY_CARE_PROVIDER_SITE_OTHER): Payer: BLUE CROSS/BLUE SHIELD | Admitting: Family

## 2016-06-08 ENCOUNTER — Encounter: Payer: Self-pay | Admitting: Family

## 2016-06-08 VITALS — BP 112/70 | HR 66 | Temp 98.0°F | Resp 16 | Ht 67.0 in | Wt 201.8 lb

## 2016-06-08 DIAGNOSIS — I10 Essential (primary) hypertension: Secondary | ICD-10-CM | POA: Diagnosis not present

## 2016-06-08 DIAGNOSIS — E6609 Other obesity due to excess calories: Secondary | ICD-10-CM | POA: Diagnosis not present

## 2016-06-08 DIAGNOSIS — Z6831 Body mass index (BMI) 31.0-31.9, adult: Secondary | ICD-10-CM

## 2016-06-08 DIAGNOSIS — Z0000 Encounter for general adult medical examination without abnormal findings: Secondary | ICD-10-CM

## 2016-06-08 DIAGNOSIS — E669 Obesity, unspecified: Secondary | ICD-10-CM | POA: Insufficient documentation

## 2016-06-08 MED ORDER — LISINOPRIL 20 MG PO TABS: 20.0000 mg | ORAL_TABLET | Freq: Every day | ORAL | 1 refills | Status: DC

## 2016-06-08 MED ORDER — HYDROCHLOROTHIAZIDE 25 MG PO TABS: 25.0000 mg | ORAL_TABLET | Freq: Every day | ORAL | 1 refills | Status: DC

## 2016-06-08 NOTE — Progress Notes (Signed)
Subjective:    Patient ID: Jamie Jenkins, female    DOB: 06-10-1960, 56 y.o.   MRN: [id number]  Chief Complaint  Patient presents with  . CPE    not fasting    HPI:  Jamie Jenkins is a 56 y.o. female who presents today for an annual wellness visit.   1) Health Maintenance -   Diet - Averaging about 3-4 meals per day consisting of a regular diet; Caffeine intake 1-2 cups per day.   Exercise - No structured exercise.    2) Preventative Exams / Immunizations:  Dental -- Up to date  Vision -- Up todate   Health Maintenance  Topic Date Due  . HIV Screening  07/31/1975  . MAMMOGRAM  04/16/2016  . PAP SMEAR  05/15/2016  . INFLUENZA VACCINE  06/13/2016 (Originally 10/15/2015)  . COLONOSCOPY  05/14/2017 (Originally 07/31/2010)  . TETANUS/TDAP  05/29/2025  . Hepatitis C Screening  Completed    Immunization History  Administered Date(s) Administered  . Tdap 05/30/2015     No Known Allergies   Outpatient Medications Prior to Visit  Medication Sig Dispense Refill  . hydrochlorothiazide (HYDRODIURIL) 25 MG tablet take 1 tablet by mouth once daily (Patient taking differently: Take 25 mg by mouth each morning) 90 tablet 1  . lisinopril (PRINIVIL,ZESTRIL) 20 MG tablet take 1 tablet by mouth once daily (Patient taking differently: Take 20 mg by mouth each morning) 90 tablet 1  . oseltamivir (TAMIFLU) 30 MG capsule Take 1 capsule (30 mg total) by mouth 2 (two) times daily. 10 capsule 0   No facility-administered medications prior to visit.      Past Medical History:  Diagnosis Date  . DDD (degenerative disc disease), lumbar   . Gall stones   . Hypertension      Past Surgical History:  Procedure Laterality Date  . AXILLARY LYMPH NODE DISSECTION Left 11/24/2012   Procedure: incision and drainage axillary abscess;  Surgeon: Adin Hector, MD;  Location: [facility];  Service: General;  Laterality: Left;  . BREAST SURGERY  2012   breast biopsy  . CHOLECYSTECTOMY    .  TUBAL LIGATION       Family History  Problem Relation Age of Onset  . Pancreatic cancer Mother     Deceased  . Hypertension Father   . Deep vein thrombosis Brother     Deceased  . Healthy Sister   . Healthy Son   . Healthy Daughter      Social History   Social History  . Marital status: Married    Spouse name: N/A  . Number of children: 3  . Years of education: 12   Occupational History  . Scientist, research (medical)    Social History Main Topics  . Smoking status: Never Smoker  . Smokeless tobacco: Never Used  . Alcohol use No  . Drug use: No  . Sexual activity: Not on file   Other Topics Concern  . Not on file   Social History Narrative   Born and raised in [place], [place]. Currently resides in a house with her husband in a one story home. No pets. Fun: Go to the beach;    Denies religious beliefs that would effect health care.    Has 3 children.    Works as a Nurse, learning disability.  Education: some college.         Review of Systems  Constitutional: Denies fever, chills, fatigue, or significant weight gain/loss. HENT: Head: Denies headache or  neck pain Ears: Denies changes in hearing, ringing in ears, earache, drainage Nose: Denies discharge, stuffiness, itching, nosebleed, sinus pain Throat: Denies sore throat, hoarseness, dry mouth, sores, thrush Eyes: Denies loss/changes in vision, pain, redness, blurry/double vision, flashing lights Cardiovascular: Denies chest pain/discomfort, tightness, palpitations, shortness of breath with activity, difficulty lying down, swelling, sudden awakening with shortness of breath Respiratory: Denies shortness of breath, cough, sputum production, wheezing Gastrointestinal: Denies dysphasia, heartburn, change in appetite, nausea, change in bowel habits, rectal bleeding, constipation, diarrhea, yellow skin or eyes Genitourinary: Denies frequency, urgency, burning/pain, blood in urine, incontinence, change in urinary  strength. Musculoskeletal: Denies muscle/joint pain, stiffness, back pain, redness or swelling of joints, trauma Skin: Denies rashes, lumps, itching, dryness, color changes, or hair/nail changes Neurological: Denies dizziness, fainting, seizures, weakness, numbness, tingling, tremor Psychiatric - Denies nervousness, stress, depression or memory loss Endocrine: Denies heat or cold intolerance, sweating, frequent urination, excessive thirst, changes in appetite Hematologic: Denies ease of bruising or bleeding     Objective:     BP 112/70 (BP Location: Left Arm, Patient Position: Sitting, Cuff Size: Large)   Pulse 66   Temp 98 F (36.7 C) (Oral)   Resp 16   Ht 5\' 7"  (1.702 m)   Wt 201 lb 12.8 oz (91.5 kg)   LMP 08/23/2012   SpO2 97%   BMI 31.61 kg/m  Nursing note and vital signs reviewed.  Physical Exam  Constitutional: She is oriented to person, place, and time. She appears well-developed and well-nourished.  HENT:  Head: Normocephalic.  Right Ear: Hearing, tympanic membrane, external ear and ear canal normal.  Left Ear: Hearing, tympanic membrane, external ear and ear canal normal.  Nose: Nose normal.  Mouth/Throat: Uvula is midline, oropharynx is clear and moist and mucous membranes are normal.  Eyes: Conjunctivae and EOM are normal. Pupils are equal, round, and reactive to light.  Neck: Neck supple. No JVD present. No tracheal deviation present. No thyromegaly present.  Cardiovascular: Normal rate, regular rhythm, normal heart sounds and intact distal pulses.   Pulmonary/Chest: Effort normal and breath sounds normal.  Abdominal: Soft. Bowel sounds are normal. She exhibits no distension and no mass. There is no tenderness. There is no rebound and no guarding.  Musculoskeletal: Normal range of motion. She exhibits no edema or tenderness.  Lymphadenopathy:    She has no cervical adenopathy.  Neurological: She is alert and oriented to person, place, and time. She has normal  reflexes. No cranial nerve deficit. She exhibits normal muscle tone. Coordination normal.  Skin: Skin is warm and dry.  Psychiatric: She has a normal mood and affect. Her behavior is normal. Judgment and thought content normal.       Assessment & Plan:   Problem List Items Addressed This Visit      Cardiovascular and Mediastinum   Essential hypertension    Blood pressure well-controlled current regimen and no adverse side effects or hypotensive readings. Denies worst headache of life with no new symptoms of end organ damage noted on physical exam. Continue current dosage of lisinopril and hydrochlorothiazide. Encouraged to follow sodium diet and continue to monitor blood pressure at home.      Relevant Medications   lisinopril (PRINIVIL,ZESTRIL) 20 MG tablet   hydrochlorothiazide (HYDRODIURIL) 25 MG tablet     Other   Routine adult health maintenance - Primary    1) Anticipatory Guidance: Discussed importance of wearing a seatbelt while driving and not texting while driving; changing batteries in smoke detector at least once  annually; wearing suntan lotion when outside; eating a balanced and moderate diet; getting physical activity at least 30 minutes per day.  2) Immunizations / Screenings / Labs:  Declines influenza. All other immunizations are up-to-date per recommendations. Due for mammogram for breast cancer screening and Pap smear for cervical cancer screening which she wishes to schedule independently. Obtain hemoglobin A1c for diabetes screening. Obtain vitamin D for vitamin D deficiency screening. All other screenings are up-to-date per recommendations. Obtain CBC, CMET, and lipid profile.    Overall well exam with risk factors for cardiovascular disease including hypertension and obesity. Blood pressure appears adequate controlled through current medication regimen. Recommended weight loss of 5-10% of current body weight through nutrition and physical activity. Continue other  healthy lifestyle behaviors and choices. Follow-up prevention exam in 1 year. Follow-up office visit pending blood work and for chronic conditions.       Relevant Orders   CBC   Comprehensive metabolic panel   Lipid panel   VITAMIN D 25 Hydroxy (Vit-D Deficiency, Fractures)   Hemoglobin A1c   Obesity    BMI of 31. Recommend weight loss of 5-10% of current body weight. Recommend increasing physical activity to 30 minutes of moderate level activity daily. Encourage nutritional intake that focuses on nutrient dense foods and is moderate, varied, and balanced and is low in saturated fats and processed/sugary foods. Continue to monitor.          I have discontinued Ms. Monaco's oseltamivir. I have also changed her lisinopril and hydrochlorothiazide.   Meds ordered this encounter  Medications  . lisinopril (PRINIVIL,ZESTRIL) 20 MG tablet    Sig: Take 1 tablet (20 mg total) by mouth daily.    Dispense:  90 tablet    Refill:  1    Order Specific Question:   Supervising Provider    Answer:   Pricilla Holm A [[id number]]  . hydrochlorothiazide (HYDRODIURIL) 25 MG tablet    Sig: Take 1 tablet (25 mg total) by mouth daily.    Dispense:  90 tablet    Refill:  1    Order Specific Question:   Supervising Provider    Answer:   Pricilla Holm A [[id number]]     Follow-up: Return if symptoms worsen or fail to improve.   [name], FNP

## 2016-06-08 NOTE — Assessment & Plan Note (Signed)
BMI of 31. Recommend weight loss of 5-10% of current body weight. Recommend increasing physical activity to 30 minutes of moderate level activity daily. Encourage nutritional intake that focuses on nutrient dense foods and is moderate, varied, and balanced and is low in saturated fats and processed/sugary foods. Continue to monitor.

## 2016-06-08 NOTE — Assessment & Plan Note (Signed)
1) Anticipatory Guidance: Discussed importance of wearing a seatbelt while driving and not texting while driving; changing batteries in smoke detector at least once annually; wearing suntan lotion when outside; eating a balanced and moderate diet; getting physical activity at least 30 minutes per day.  2) Immunizations / Screenings / Labs:  Declines influenza. All other immunizations are up-to-date per recommendations. Due for mammogram for breast cancer screening and Pap smear for cervical cancer screening which she wishes to schedule independently. Obtain hemoglobin A1c for diabetes screening. Obtain vitamin D for vitamin D deficiency screening. All other screenings are up-to-date per recommendations. Obtain CBC, CMET, and lipid profile.    Overall well exam with risk factors for cardiovascular disease including hypertension and obesity. Blood pressure appears adequate controlled through current medication regimen. Recommended weight loss of 5-10% of current body weight through nutrition and physical activity. Continue other healthy lifestyle behaviors and choices. Follow-up prevention exam in 1 year. Follow-up office visit pending blood work and for chronic conditions.

## 2016-06-08 NOTE — Assessment & Plan Note (Signed)
Blood pressure well-controlled current regimen and no adverse side effects or hypotensive readings. Denies worst headache of life with no new symptoms of end organ damage noted on physical exam. Continue current dosage of lisinopril and hydrochlorothiazide. Encouraged to follow sodium diet and continue to monitor blood pressure at home.

## 2016-06-08 NOTE — Patient Instructions (Addendum)
Thank you for choosing [facility].  SUMMARY AND INSTRUCTIONS:  Check on the price of Cologuard if feasible.   Continue to take your medications as prescribed.  Work on completing the mammogram.   Medication:  Your prescription(s) have been submitted to your pharmacy or been printed and provided for you. Please take as directed and contact our office if you believe you are having problem(s) with the medication(s) or have any questions.  Labs:  Please stop by the lab on the lower level of the building for your blood work. Your results will be released to [place] (or called to you) after review, usually within 72 hours after test completion. If any changes need to be made, you will be notified at that same time.  1.) The lab is open from 7:30am to 5:30 pm Monday-Friday 2.) No appointment is necessary 3.) Fasting (if needed) is 6-8 hours after food and drink; black coffee and water are okay   Follow up:  If your symptoms worsen or fail to improve, please contact our office for further instruction, or in case of emergency go directly to the emergency room at the closest medical facility.   Health Maintenance, Female Adopting a healthy lifestyle and getting preventive care can go a long way to promote health and wellness. Talk with your health c re provider about what schedule of regular examinations is right for you. This is a good chance for you to check in with your provider about disease prevention and staying healthy. In between checkups, there are plenty of things you can do on your own. Experts have done a lot of research about which lifestyle changes and preventive measures are most likely to keep you healthy. Ask your health care provider for more information. Weight and diet Eat a healthy diet  Be sure to include plenty of vegetables, fruits, low-fat dairy products, and lean protein.  Do not eat a lot of foods high in solid fats, added sugars, or salt.  Get regular  exercise. This is one of the most important things you can do for your health.  Most adults should exercise for at least 150 minutes each week. The exercise should increase your heart rate and make you sweat (moderate-intensity exercise).  Most adults should also do strengthening exercises at least twice a week. This is in addition to the moderate-intensity exercise. Maintain a healthy weight  Body mass index (BMI) is a measurement that can be used to identify possible weight problems. It estimates body fat based on height and weight. Your health care provider can help determine your BMI and help you achieve or maintain a healthy weight.  For females 83 years of age and older:  A BMI below 18.5 is considered underweight.  A BMI of 18.5 to 24.9 is normal.  A BMI of 25 to 29.9 is considered overweight.  A BMI of 30 and above is considered obese. Watch levels of cholesterol and blood lipids  You should start having your blood tested for lipids and cholesterol at 56 years of age, then have this test every 5 years.  You may need to have your cholesterol levels checked more often if:  Your lipid or cholesterol levels are high.  You are older than 56 years of age.  You are at high risk for heart disease. Cancer screening Lung Cancer  Lung cancer screening is recommended for adults 70-14 years old who are at high risk for lung cancer because of a history of smoking.  A yearly  low-dose CT scan of the lungs is recommended for people who:  Currently smoke.  Have quit within the past 15 years.  Have at least a 30-pack-year history of smoking. A pack year is smoking an average of one pack of cigarettes a day for 1 year.  Yearly screening should continue until it has been 15 years since you quit.  Yearly screening should stop if you develop a health problem that would prevent you from having lung cancer treatment. Breast Cancer  Practice breast self-awareness. This means  understanding how your breasts normally appear and feel.  It also means doing regular breast self-exams. Let your health care provider know about any changes, no matter how small.  If you are in your 20s or 30s, you should have a clinical breast exam (CBE) by a health care provider every 1-3 years as part of a regular health exam.  If you are 52 or older, have a CBE every year. Also consider having a breast X-ray (mammogram) every year.  If you have a family history of breast cancer, talk to your health care provider about genetic screening.  If you are at high risk for breast cancer, talk to your health care provider about having an MRI and a mammogram every year.  Breast cancer gene (BRCA) assessment is recommended for women who have family members with BRCA-related cancers. BRCA-related cancers include:  Breast.  Ovarian.  Tubal.  Peritoneal cancers.  Results of the assessment will determine the need for genetic counseling and BRCA1 and BRCA2 testing. Cervical Cancer  Your health care provider may recommend that you be screened regularly for cancer of the pelvic organs (ovaries, uterus, and vagina). This screening involves a pelvic examination, including checking for microscopic changes to the surface of your cervix (Pap test). You may be encouraged to have this screening done every 3 years, beginning at age 44.  For women ages 21-65, health care providers may recommend pelvic exams and Pap testing every 3 years, or they may recommend the Pap and pelvic exam, combined with testing for human papilloma virus (HPV), every 5 years. Some types of HPV increase your risk of cervical cancer. Testing for HPV may also be done on women of any age with unclear Pap test results.  Other health care providers may not recommend any screening for nonpregnant women who are considered low risk for pelvic cancer and who do not have symptoms. Ask your health care provider if a screening pelvic exam is  right for you.  If you have had past treatment for cervical cancer or a condition that could lead to cancer, you need Pap tests and screening for cancer for at least 20 years after your treatment. If Pap tests have been discontinued, your risk factors (such as having a new sexual partner) need to be reassessed to determine if screening should resume. Some women have medical problems that increase the chance of getting cervical cancer. In these cases, your health care provider may recommend more frequent screening and Pap tests. Colorectal Cancer  This type of cancer can be detected and often prevented.  Routine colorectal cancer screening usually begins at 55 years of age and continues through 56 years of age.  Your health care provider may recommend screening at an earlier age if you have risk factors for colon cancer.  Your health care provider may also recommend using home test kits to check for hidden blood in the stool.  A small camera at the end of a tube can  be used to examine your colon directly (sigmoidoscopy or colonoscopy). This is done to check for the earliest forms of colorectal cancer.  Routine screening usually begins at age 21.  Direct examination of the colon should be repeated every 5-10 years through 56 years of age. However, you may need to be screened more often if early forms of precancerous polyps or small growths are found. Skin Cancer  Check your skin from head to toe regularly.  Tell your health care provider about any new moles or changes in moles, especially if there is a change in a mole's shape or color.  Also tell your health care provider if you have a mole that is larger than the size of a pencil eraser.  Always use sunscreen. Apply sunscreen liberally and repeatedly throughout the day.  Protect yourself by wearing long sleeves, pants, a wide-brimmed hat, and sunglasses whenever you are outside. Heart disease, diabetes, and high blood pressure  High  blood pressure causes heart disease and increases the risk of stroke. High blood pressure is more likely to develop in:  People who have blood pressure in the high end of the normal range (130-139/85-89 mm Hg).  People who are overweight or obese.  People who are African American.  If you are 31-43 years of age, have your blood pressure checked every 3-5 years. If you are 36 years of age or older, have your blood pressure checked every year. You should have your blood pressure measured twice-once when you are at a hospital or clinic, and once when you are not at a hospital or clinic. Record the average of the two measurements. To check your blood pressure when you are not at a hospital or clinic, you can use:  An automated blood pressure machine at a pharmacy.  A home blood pressure monitor.  If you are between 32 years and 55 years old, ask your health care provider if you should take aspirin to prevent strokes.  Have regular diabetes screenings. This involves taking a blood sample to check your fasting blood sugar level.  If you are at a normal weight and have a low risk for diabetes, have this test once every three years after 56 years of age.  If you are overweight and have a high risk for diabetes, consider being tested at a younger age or more often. Preventing infection Hepatitis B  If you have a higher risk for hepatitis B, you should be screened for this virus. You are considered at high risk for hepatitis B if:  You were born in a country where hepatitis B is common. Ask your health care provider which countries are considered high risk.  Your parents were born in a high-risk country, and you have not been immunized against hepatitis B (hepatitis B vaccine).  You have HIV or AIDS.  You use needles to inject street drugs.  You live with someone who has hepatitis B.  You have had sex with someone who has hepatitis B.  You get hemodialysis treatment.  You take certain  medicines for conditions, including cancer, organ transplantation, and autoimmune conditions. Hepatitis C  Blood testing is recommended for:  Everyone born from [birth year] through 1965.  Anyone with known risk factors for hepatitis C. Sexually transmitted infections (STIs)  You should be screened for sexually transmitted infections (STIs) including gonorrhea and chlamydia if:  You are sexually active and are younger than 56 years of age.  You are older than 56 years of age and your  health care provider tells you that you are at risk for this type of infection.  Your sexual activity has changed since you were last screened and you are at an increased risk for chlamydia or gonorrhea. Ask your health care provider if you are at risk.  If you do not have HIV, but are at risk, it may be recommended that you take a prescription medicine daily to prevent HIV infection. This is called pre-exposure prophylaxis (PrEP). You are considered at risk if:  You are sexually active and do not regularly use condoms or know the HIV status of your partner(s).  You take drugs by injection.  You are sexually active with a partner who has HIV. Talk with your health care provider about whether you are at high risk of being infected with HIV. If you choose to begin PrEP, you should first be tested for HIV. You should then be tested every 3 months for as long as you are taking PrEP. Pregnancy  If you are premenopausal and you may become pregnant, ask your health care provider about preconception counseling.  If you may become pregnant, take 400 to 800 micrograms (mcg) of folic acid every day.  If you want to prevent pregnancy, talk to your health care provider about birth control (contraception). Osteoporosis and menopause  Osteoporosis is a disease in which the bones lose minerals and strength with aging. This can result in serious bone fractures. Your risk for osteoporosis can be identified using a bone density  scan.  If you are 2 years of age or older, or if you are at risk for osteoporosis and fractures, ask your health care provider if you should be screened.  Ask your health care provider whether you should take a calcium or vitamin D supplement to lower your risk for osteoporosis.  Menopause may have certain physical symptoms and risks.  Hormone replacement therapy may reduce some of these symptoms and risks. Talk to your health care provider about whether hormone replacement therapy is right for you. Follow these instructions at home:  Schedule regular health, dental, and eye exams.  Stay current with your immunizations.  Do not use any tobacco products including cigarettes, chewing tobacco, or electronic cigarettes.  If you are pregnant, do not drink alcohol.  If you are breastfeeding, limit how much and how often you drink alcohol.  Limit alcohol intake to no more than 1 drink per day for nonpregnant women. One drink equals 12 ounces of beer, 5 ounces of wine, or 1 ounces of hard liquor.  Do not use street drugs.  Do not share needles.  Ask your health care provider for help if you need support or information about quitting drugs.  Tell your health care provider if you often feel depressed.  Tell your health care provider if you have ever been abused or do not feel safe at home. This information is not intended to replace advice given to you by your health care provider. Make sure you discuss any questions you have with your health care provider. Document Released: 09/15/2010 Document Revised: 08/08/2015 Document Reviewed: 12/04/2014 Elsevier Interactive Patient Education  2017 [employer].

## 2016-06-23 ENCOUNTER — Other Ambulatory Visit (INDEPENDENT_AMBULATORY_CARE_PROVIDER_SITE_OTHER): Payer: BLUE CROSS/BLUE SHIELD

## 2016-06-23 DIAGNOSIS — Z0000 Encounter for general adult medical examination without abnormal findings: Secondary | ICD-10-CM

## 2016-06-23 LAB — CBC
HEMATOCRIT: 43.5 % (ref 36.0–46.0)
HEMOGLOBIN: 15 g/dL (ref 12.0–15.0)
MCHC: 34.5 g/dL (ref 30.0–36.0)
MCV: 100.6 fl — AB (ref 78.0–100.0)
Platelets: 259 10*3/uL (ref 150.0–400.0)
RBC: 4.32 Mil/uL (ref 3.87–5.11)
RDW: 13.8 % (ref 11.5–15.5)
WBC: 6.9 10*3/uL (ref 4.0–10.5)

## 2016-06-23 LAB — LIPID PANEL
CHOLESTEROL: 165 mg/dL (ref 0–200)
HDL: 73.5 mg/dL (ref >39.00)
LDL Cholesterol: 68 mg/dL (ref 0–99)
NonHDL: 91.07
Total CHOL/HDL Ratio: 2
Triglycerides: 113 mg/dL (ref 0.0–149.0)
VLDL: 22.6 mg/dL (ref 0.0–40.0)

## 2016-06-23 LAB — COMPREHENSIVE METABOLIC PANEL
ALBUMIN: 4.3 g/dL (ref 3.5–5.2)
ALT: 16 U/L (ref 0–35)
AST: 19 U/L (ref 0–37)
Alkaline Phosphatase: 119 U/L — ABNORMAL HIGH (ref 39–117)
BUN: 9 mg/dL (ref 6–23)
CHLORIDE: 102 meq/L (ref 96–112)
CO2: 29 mEq/L (ref 19–32)
Calcium: 9.6 mg/dL (ref 8.4–10.5)
Creatinine, Ser: 0.86 mg/dL (ref 0.40–1.20)
GFR: 87.81 mL/min (ref >60.00)
Glucose, Bld: 98 mg/dL (ref 70–99)
POTASSIUM: 3.6 meq/L (ref 3.5–5.1)
SODIUM: 136 meq/L (ref 135–145)
Total Bilirubin: 0.5 mg/dL (ref 0.2–1.2)
Total Protein: 8 g/dL (ref 6.0–8.3)

## 2016-06-23 LAB — HEMOGLOBIN A1C: Hgb A1c MFr Bld: 5.5 % (ref 4.6–6.5)

## 2016-06-23 LAB — VITAMIN D 25 HYDROXY (VIT D DEFICIENCY, FRACTURES): VITD: 15.87 ng/mL — AB (ref 30.00–100.00)

## 2016-12-01 ENCOUNTER — Telehealth: Payer: Self-pay | Admitting: Family

## 2016-12-01 MED ORDER — HYDROCHLOROTHIAZIDE 25 MG PO TABS: 25.0000 mg | ORAL_TABLET | Freq: Every day | ORAL | 1 refills | Status: DC

## 2016-12-01 MED ORDER — LISINOPRIL 20 MG PO TABS: 20.0000 mg | ORAL_TABLET | Freq: Every day | ORAL | 1 refills | Status: DC

## 2016-12-01 NOTE — Telephone Encounter (Signed)
Pt husband is loosing his job and insurance and they would like refills of all of the meds for 90 days to get through for when he gets a new job and insurance. Please advise, they would like this done today.

## 2016-12-01 NOTE — Telephone Encounter (Signed)
Reviewed chart pt is up-to-date sent refills to [facility].../lmb  

## 2017-05-20 ENCOUNTER — Ambulatory Visit: Payer: Self-pay | Admitting: *Deleted

## 2017-05-20 ENCOUNTER — Encounter: Payer: Self-pay | Admitting: Family Medicine

## 2017-05-20 ENCOUNTER — Ambulatory Visit (INDEPENDENT_AMBULATORY_CARE_PROVIDER_SITE_OTHER)
Admission: RE | Admit: 2017-05-20 | Discharge: 2017-05-20 | Disposition: A | Discharge status: Home / Self Care | Payer: BLUE CROSS/BLUE SHIELD | Source: Ambulatory Visit | Attending: Family Medicine | Admitting: Family Medicine

## 2017-05-20 ENCOUNTER — Ambulatory Visit: Payer: BLUE CROSS/BLUE SHIELD | Admitting: Family Medicine

## 2017-05-20 VITALS — BP 138/84 | HR 72 | Temp 97.8°F | Ht 67.0 in | Wt 205.0 lb

## 2017-05-20 DIAGNOSIS — R079 Chest pain, unspecified: Secondary | ICD-10-CM

## 2017-05-20 NOTE — Telephone Encounter (Signed)
Patient is calling to report that she has been having intermittent chest pain on the R. Patient states this has been going on for 2-3 weeks and when it occurs - it is sharp and causes her to have SOB. She states she rubs her chest and it eases and goes away. She does not associate it with anything. She does not have any other symptoms- sweating, headaches, radiation, etc.  Discussed the need for evaluation and patient declines hospital visit because she states she is fine now. Call to office and flow coordinator gives permission to see patient. Patient informed that she may need to be seen elsewhere if recommended by office- she is aware. She is also aware to make an NP/est care appointment today. Reason for Disposition . Chest pain lasts > 5 minutes (Exceptions: chest pain occurring > 3 days ago and now asymptomatic; same as previously diagnosed heartburn and has accompanying sour taste in mouth)  Answer Assessment - Initial Assessment Questions 1. LOCATION: "Where does it hurt?"       R chest wall 2. RADIATION: "Does the pain go anywhere else?" (e.g., into neck, jaw, arms, back)     Patient states the pain is in her R shoulder also- feels like she can't get her breathe- pain is so bad 3. ONSET: "When did the chest pain begin?" (Minutes, hours or days)      2 weeks off/on- lasting 5 minutes 4. PATTERN "Does the pain come and go, or has it been constant since it started?"  "Does it get worse with exertion?"      Comes/goes- patient waits until it goes away- comes at anytime 5. DURATION: "How long does it last" (e.g., seconds, minutes, hours)     5 minutes and goes away 6. SEVERITY: "How bad is the pain?"  (e.g., Scale 1-10; mild, moderate, or severe)    - MILD (1-3): doesn't interfere with normal activities     - MODERATE (4-7): interferes with normal activities or awakens from sleep    - SEVERE (8-10): excruciating pain, unable to do any normal activities       9- when it is occurring- patient  rubs area and it stops 7. CARDIAC RISK FACTORS: "Do you have any history of heart problems or risk factors for heart disease?" (e.g., prior heart attack, angina; high blood pressure, diabetes, being overweight, high cholesterol, smoking, or strong family history of heart disease)     High blood pressure, overweight 8. PULMONARY RISK FACTORS: "Do you have any history of lung disease?"  (e.g., blood clots in lung, asthma, emphysema, birth control pills)     no 9. CAUSE: "What do you think is causing the chest pain?"     No idea- pain occurred last night 10. OTHER SYMPTOMS: "Do you have any other symptoms?" (e.g., dizziness, nausea, vomiting, sweating, fever, difficulty breathing, cough)       no 11. PREGNANCY: "Is there any chance you are pregnant?" "When was your last menstrual period?"       n/a  Protocols used: CHEST PAIN-A-AH

## 2017-05-20 NOTE — Progress Notes (Signed)
Jamie Jenkins - 57 y.o. female MRN [id number]  Date of birth: Dec 25, 1960  SUBJECTIVE:  Including CC & ROS.  Chief Complaint  Patient presents with  . Chest Pain    Jamie Jenkins is a 57 y.o. female that is presenting with chest pain. She noticed the pain one week ago. The pain is on the right side of her chest. Having some pain in her right arm. Pain lasts a few minutes. Has not had any recent viral illness. Pain is intermittent. No history of blood clots. No recent travel. Admits to shortness of breath. Denies injury. Denies reflux. Denies dizziness or headaches. Has not taken anything for the pain.   Review of Systems  Constitutional: Negative for fever.  Respiratory: Positive for shortness of breath.   Cardiovascular: Positive for chest pain.  Gastrointestinal: Negative for abdominal pain.  Musculoskeletal: Negative for back pain.  Neurological: Negative for weakness.  Hematological: Negative for adenopathy.    HISTORY: Past Medical, Surgical, Social, and Family History Reviewed & Updated per EMR.   Pertinent Historical Findings include:  Past Medical History:  Diagnosis Date  . DDD (degenerative disc disease), lumbar   . Gall stones   . Hypertension     Past Surgical History:  Procedure Laterality Date  . AXILLARY LYMPH NODE DISSECTION Left 11/24/2012   Procedure: incision and drainage axillary abscess;  Surgeon: Adin Hector, MD;  Location: [facility];  Service: General;  Laterality: Left;  . BREAST SURGERY  2012   breast biopsy  . CHOLECYSTECTOMY    . TUBAL LIGATION      No Known Allergies  Family History  Problem Relation Age of Onset  . Pancreatic cancer Mother        Deceased  . Hypertension Father   . Deep vein thrombosis Brother        Deceased  . Healthy Sister   . Healthy Son   . Healthy Daughter      Social History   Socioeconomic History  . Marital status: Married    Spouse name: Not on file  . Number of children: 3  . Years of education: 24   . Highest education level: Not on file  Social Needs  . Financial resource strain: Not on file  . Food insecurity - worry: Not on file  . Food insecurity - inability: Not on file  . Transportation needs - medical: Not on file  . Transportation needs - non-medical: Not on file  Occupational History  . Occupation: Scientist, research (medical)  Tobacco Use  . Smoking status: Never Smoker  . Smokeless tobacco: Never Used  Substance and Sexual Activity  . Alcohol use: No    Alcohol/week: 0.0 oz  . Drug use: No  . Sexual activity: Not on file  Other Topics Concern  . Not on file  Social History Narrative   Born and raised in [place], [place]. Currently resides in a house with her husband in a one story home. No pets. Fun: Go to the beach;    Denies religious beliefs that would effect health care.    Has 3 children.    Works as a Nurse, learning disability.  Education: some college.     PHYSICAL EXAM:  VS: BP 138/84 (BP Location: Left Arm, Patient Position: Sitting, Cuff Size: Normal)   Pulse 72   Temp 97.8 F (36.6 C) (Oral)   Ht 5\' 7"  (1.702 m)   Wt 205 lb (93 kg)   LMP 08/23/2012   SpO2 100%  BMI 32.11 kg/m  Physical Exam Gen: NAD, alert, cooperative with exam, ENT: normal lips, normal nasal mucosa,  Eye: normal EOM, normal conjunctiva and lids CV:  no edema, +2 pedal pulses, regular rate and rhythm, S1-S2   Resp: no accessory muscle use, non-labored, clear to auscultation bilaterally, no crackles or wheezes Skin: no rashes, no areas of induration  Neuro: normal tone, normal sensation to touch Psych:  normal insight, alert and oriented MSK: Normal gait, normal strength, TTP of the right chest and sternum    EKG interpretation: Normal sinus rhythm   ASSESSMENT & PLAN:   Chest pain in adult Possible for costochondritis. Less likely here being cardiac in origin. Doesn't appear to be infectious. No recent viral infection. No trauma to the chest. - Chest x-ray, d-dimer, troponin - If  d-dimer elevated then would need a CTA, if troponin elevated would need to go the emergency room. We'll call with the results.

## 2017-05-20 NOTE — Assessment & Plan Note (Signed)
Possible for costochondritis. Less likely here being cardiac in origin. Doesn't appear to be infectious. No recent viral infection. No trauma to the chest. - Chest x-ray, d-dimer, troponin - If d-dimer elevated then would need a CTA, if troponin elevated would need to go the emergency room. We'll call with the results.

## 2017-05-20 NOTE — Patient Instructions (Signed)
We will call you with the results from today  Please seek immediate care if your symptoms worsen

## 2017-05-21 ENCOUNTER — Telehealth: Payer: Self-pay | Admitting: Family Medicine

## 2017-05-21 NOTE — Telephone Encounter (Signed)
Copied from [place] [id number]. Topic: Quick Communication - See Telephone Encounter >> May 21, 2017 12:48 PM Vernona Rieger wrote: CRM for notification. See Telephone encounter for:   05/21/17.  Pt is requesting lab results from 3/7, please call back at [phone]

## 2017-05-24 NOTE — Telephone Encounter (Signed)
JS-Pt is calling about labs ordered on 3.7.19/looks like it was ordered as STAT but I see no results/IDK if you can ask someone down-stairs there? Plz advise/thx dmf

## 2017-05-24 NOTE — Telephone Encounter (Signed)
Attempted to call patient but unable to leave VM.  If she calls back please have her speak with a nurse/CMA and ask if she had her blood drawn during that visit. It doesn't appear that the blood was drawn.   If any questions then please take the best time and phone number to call and I will try to call her back.   Rosemarie Ax, MD [facility] 05/24/2017, 5:17 PM

## 2017-05-25 ENCOUNTER — Other Ambulatory Visit (INDEPENDENT_AMBULATORY_CARE_PROVIDER_SITE_OTHER): Payer: BLUE CROSS/BLUE SHIELD

## 2017-05-25 DIAGNOSIS — R079 Chest pain, unspecified: Secondary | ICD-10-CM | POA: Diagnosis not present

## 2017-05-25 LAB — D-DIMER, QUANTITATIVE (NOT AT ARMC): D DIMER QUANT: 0.29 ug{FEU}/mL (ref <0.50)

## 2017-05-25 LAB — TROPONIN I: TNIDX: 0 ug/L (ref 0.00–0.06)

## 2017-05-25 NOTE — Telephone Encounter (Signed)
Patient said she did not do any blood work. She said she only did the chest xray. Please call patient back [phone].

## 2017-05-25 NOTE — Telephone Encounter (Signed)
Spoke with patient about her last visit. Labs were ordered but patient did not have the labs drawn. She would like to come by today and still have them drawn.   Rosemarie Ax, MD [facility] 05/25/2017, 12:30 PM

## 2017-05-26 NOTE — Telephone Encounter (Signed)
Patient notified instructed of her lab results.

## 2017-06-16 ENCOUNTER — Telehealth: Payer: Self-pay | Admitting: Internal Medicine

## 2017-06-16 NOTE — Telephone Encounter (Signed)
Yes, I will see her 

## 2017-06-16 NOTE — Telephone Encounter (Signed)
Called and informed patient. She said that she would call back to schedule an appointment to establish care.

## 2017-06-16 NOTE — Telephone Encounter (Signed)
Dr. Jones, °Would you be willing to see this patient to establish care? ° °

## 2017-06-16 NOTE — Telephone Encounter (Signed)
Copied from [place] [id number]. Topic: Appointment Scheduling - Prior Auth Required for Appointment >> Jun 15, 2017  4:58 PM Arletha Grippe wrote: No appointment has been scheduled. Patient is requesting new patient appointment. Per scheduling protocol, this appointment requires a prior authorization prior to scheduling. Pt was with [facility], and wants to be established with [facility].  Pt's husband sees Dr Ronnald Ramp and would like to stay at the same office.  Please call with determination prior to 06/21/17. Cb is [phone]   Route to department's PEC pool.

## 2017-06-17 ENCOUNTER — Ambulatory Visit: Payer: BLUE CROSS/BLUE SHIELD | Admitting: Family Medicine

## 2017-06-17 VITALS — BP 130/74 | HR 52 | Temp 98.0°F | Ht 67.0 in | Wt 204.0 lb

## 2017-06-17 DIAGNOSIS — M79672 Pain in left foot: Secondary | ICD-10-CM

## 2017-06-17 MED ORDER — MELOXICAM 15 MG PO TABS: 15.0000 mg | ORAL_TABLET | Freq: Every day | ORAL | 1 refills | Status: DC

## 2017-06-17 NOTE — Progress Notes (Signed)
Jamie Jenkins - 57 y.o. female MRN [id number]  Date of birth: 1961-02-15  SUBJECTIVE:  Including CC & ROS.  Chief Complaint  Patient presents with  . Left foot pain    Jamie Jenkins is a 57 y.o. female that is presenting with left foot pain. Pain is chronic in nature, it has been increasing over the past month. She states the pain is located on the medial aspect of her left foot. Pain is constant, mild to severe. Walking exacerbates the pain. Admits to swelling. She has tried wearing insoles and taking motrin with no improvement. Denies injury or surgeries. Denies tingling or numbness.  Reports she was told she would need surgery.    Review of Systems  Constitutional: Negative for fever.  HENT: Negative for congestion.   Respiratory: Negative for cough.   Cardiovascular: Negative for chest pain.  Gastrointestinal: Negative for abdominal pain.  Musculoskeletal: Positive for gait problem.    HISTORY: Past Medical, Surgical, Social, and Family History Reviewed & Updated per EMR.   Pertinent Historical Findings include:  Past Medical History:  Diagnosis Date  . DDD (degenerative disc disease), lumbar   . Gall stones   . Hypertension     Past Surgical History:  Procedure Laterality Date  . AXILLARY LYMPH NODE DISSECTION Left 11/24/2012   Procedure: incision and drainage axillary abscess;  Surgeon: Adin Hector, MD;  Location: [facility];  Service: General;  Laterality: Left;  . BREAST SURGERY  2012   breast biopsy  . CHOLECYSTECTOMY    . TUBAL LIGATION      No Known Allergies  Family History  Problem Relation Age of Onset  . Pancreatic cancer Mother        Deceased  . Hypertension Father   . Deep vein thrombosis Brother        Deceased  . Healthy Sister   . Healthy Son   . Healthy Daughter      Social History   Socioeconomic History  . Marital status: Married    Spouse name: Not on file  . Number of children: 3  . Years of education: 65  . Highest education  level: Not on file  Occupational History  . Occupation: Scientist, research (medical)  Social Needs  . Financial resource strain: Not on file  . Food insecurity:    Worry: Not on file    Inability: Not on file  . Transportation needs:    Medical: Not on file    Non-medical: Not on file  Tobacco Use  . Smoking status: Never Smoker  . Smokeless tobacco: Never Used  Substance and Sexual Activity  . Alcohol use: No    Alcohol/week: 0.0 oz  . Drug use: No  . Sexual activity: Not on file  Lifestyle  . Physical activity:    Days per week: Not on file    Minutes per session: Not on file  . Stress: Not on file  Relationships  . Social connections:    Talks on phone: Not on file    Gets together: Not on file    Attends religious service: Not on file    Active member of club or organization: Not on file    Attends meetings of clubs or organizations: Not on file    Relationship status: Not on file  . Intimate partner violence:    Fear of current or ex partner: Not on file    Emotionally abused: Not on file    Physically abused: Not on file  Forced sexual activity: Not on file  Other Topics Concern  . Not on file  Social History Narrative   Born and raised in [place], [place]. Currently resides in a house with her husband in a one story home. No pets. Fun: Go to the beach;    Denies religious beliefs that would effect health care.    Has 3 children.    Works as a Nurse, learning disability.  Education: some college.     PHYSICAL EXAM:  VS: BP 130/74 (BP Location: Left Arm, Patient Position: Sitting, Cuff Size: Normal)   Pulse (!) 52   Temp 98 F (36.7 C) (Oral)   Ht 5\' 7"  (1.702 m)   Wt 204 lb (92.5 kg)   LMP 08/23/2012   SpO2 100%   BMI 31.95 kg/m  Physical Exam Gen: NAD, alert, cooperative with exam, well-appearing ENT: normal lips, normal nasal mucosa,  Eye: normal EOM, normal conjunctiva and lids CV:  no edema, +2 pedal pulses   Resp: no accessory muscle use, non-labored,  Skin: no  rashes, no areas of induration  Neuro: normal tone, normal sensation to touch Psych:  normal insight, alert and oriented MSK:  Left foot:  Significant subtalar shift  Collapse of the longitudinal arch  Cyst on medial 1st MTP  No abnormal callus or ulcer  TTP over the tarsal tunnel Neurovascularly intact       ASSESSMENT & PLAN:   Left foot pain Significant structural changes to her foot. Has component of posterior tib insufficiency. Has tried conservative care in the past.  - referral to ortho, Dr. Doran Durand  - could try having custom orthotics again

## 2017-06-17 NOTE — Patient Instructions (Signed)
You can schedule with me to have custom orthotics made if you wish  I have made a referral to orthopedics.  Please take the mobic 10 days straight and then as needed.

## 2017-06-18 DIAGNOSIS — M79672 Pain in left foot: Secondary | ICD-10-CM | POA: Insufficient documentation

## 2017-06-18 NOTE — Assessment & Plan Note (Addendum)
Significant structural changes to her foot. Has component of posterior tib insufficiency. Has tried conservative care in the past.  - mobic - referral to ortho, Dr. Doran Durand  - could try having custom orthotics again

## 2017-06-21 ENCOUNTER — Ambulatory Visit: Payer: BLUE CROSS/BLUE SHIELD | Admitting: Family Medicine

## 2017-06-22 ENCOUNTER — Telehealth: Payer: Self-pay | Admitting: Family Medicine

## 2017-06-22 NOTE — Telephone Encounter (Signed)
Copied from [place] [id number]. Topic: Quick Communication - See Telephone Encounter >> Jun 22, 2017  1:45 PM Boyd Kerbs wrote: CRM for notification.   Pt. Is asking for call back on referrals.  The ones referred to is not working  See Telephone encounter for: 06/22/17.

## 2017-06-22 NOTE — Telephone Encounter (Signed)
Referral placed by Dr Raeford Razor.

## 2017-06-24 DIAGNOSIS — M25572 Pain in left ankle and joints of left foot: Secondary | ICD-10-CM | POA: Diagnosis not present

## 2017-07-05 ENCOUNTER — Encounter (INDEPENDENT_AMBULATORY_CARE_PROVIDER_SITE_OTHER): Payer: Self-pay | Admitting: Orthopedic Surgery

## 2017-07-05 ENCOUNTER — Ambulatory Visit (INDEPENDENT_AMBULATORY_CARE_PROVIDER_SITE_OTHER): Payer: BLUE CROSS/BLUE SHIELD | Admitting: Orthopedic Surgery

## 2017-07-05 DIAGNOSIS — M76822 Posterior tibial tendinitis, left leg: Secondary | ICD-10-CM

## 2017-07-05 NOTE — Progress Notes (Signed)
Office Visit Note   Patient: Jamie Jenkins           Date of Birth: [birth year]           MRN: [id number] Visit Date: 07/05/2017              Requested by: [facility] [address], [address] [place], [place] [address] PCP: [facility], FNP  Chief Complaint  Patient presents with  . 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  She states she has had symptoms for over 10 years recently she has been treated with conservative bracing for over 5 months without relief.  Patient has used anti-inflammatories without relief.  Assessment & Plan: Visit Diagnoses:  1. Posterior tibial tendinitis, left leg     Plan: Discussed with the patient surgical intervention option would be to proceed to subtalar and talonavicular fusion.  Discussed that she would lose her subtalar motion she would have to be completely off her foot for at least 4 weeks that she would need assistance at home to provide meals and provide support.  Discussed risks of surgery including infection neurovascular injury persistent pain.  Patient states she understands wished to proceed with surgery at this time we will plan for outpatient surgery at [facility].  Follow-Up Instructions: Return in about 2 weeks (around 07/19/2017).   Ortho Exam  Patient is alert, oriented, no adenopathy, well-dressed, normal affect, normal respiratory effort. Examination patient has a good dorsalis pedis pulse she has a negative history for diabetes she is not a smoker.  Patient has pronation and valgus of the forefoot left foot.  Resisted internal rotation is painful she cannot do a single limb heel raise.  She has essentially no subtalar motion and has impingement symptoms laterally.  The talonavicular is tender to palpation the posterior tibial tendon is tender to palpation she has no tenderness to palpation over the base of the calcaneocuboid  joint.  Review of the radiographs shows no specific degenerative changes of the ankle or subtalar joints.  Imaging: No results found. No images are attached to the encounter.  Labs: Lab Results  Component Value Date   HGBA1C 5.5 06/23/2016    @LABSALLVALUES (HGBA1)@  There is no height or weight on file to calculate BMI.  Orders:  No orders of the defined types were placed in this encounter.  No orders of the defined types were placed in this encounter.    Procedures: No procedures performed  Clinical Data: No additional findings.  ROS:  All other systems negative, except as noted in the HPI. Review of Systems  Objective: Vital Signs: LMP 08/23/2012   Specialty Comments:  No specialty comments available.  PMFS History: Patient Active Problem List   Diagnosis Date Noted  . Posterior tibial tendinitis, left leg 07/05/2017  . Left foot pain 06/18/2017  . Chest pain in adult 05/20/2017  . Obesity 06/08/2016  . Routine adult health maintenance 06/06/2015  . Multiple falls 06/06/2015  . Contusion of left leg 06/06/2015  . Faintness 06/06/2015  . Cough 08/24/2014  . DDD (degenerative disc disease), lumbar 05/21/2014  . Essential hypertension 05/21/2014  . Right leg pain 04/02/2014  . Generalized headaches 04/02/2014  . Abscess of axilla, left 11/21/2012   Past Medical History:  Diagnosis Date  . DDD (degenerative disc disease), lumbar   . Gall stones   . Hypertension  Family History  Problem Relation Age of Onset  . Pancreatic cancer Mother        Deceased  . Hypertension Father   . Deep vein thrombosis Brother        Deceased  . Healthy Sister   . Healthy Son   . Healthy Daughter     Past Surgical History:  Procedure Laterality Date  . AXILLARY LYMPH NODE DISSECTION Left 11/24/2012   Procedure: incision and drainage axillary abscess;  Surgeon: Adin Hector, MD;  Location: [facility];  Service: General;  Laterality: Left;  . BREAST SURGERY  2012     breast biopsy  . CHOLECYSTECTOMY    . TUBAL LIGATION     Social History   Occupational History  . Occupation: Scientist, research (medical)  Tobacco Use  . Smoking status: Never Smoker  . Smokeless tobacco: Never Used  Substance and Sexual Activity  . Alcohol use: No    Alcohol/week: 0.0 oz  . Drug use: No  . Sexual activity: Not on file

## 2017-07-09 ENCOUNTER — Other Ambulatory Visit (INDEPENDENT_AMBULATORY_CARE_PROVIDER_SITE_OTHER): Payer: Self-pay | Admitting: Orthopedic Surgery

## 2017-07-09 ENCOUNTER — Telehealth (INDEPENDENT_AMBULATORY_CARE_PROVIDER_SITE_OTHER): Payer: Self-pay | Admitting: Orthopedic Surgery

## 2017-07-09 DIAGNOSIS — M76822 Posterior tibial tendinitis, left leg: Secondary | ICD-10-CM

## 2017-07-09 NOTE — Telephone Encounter (Signed)
Jamie Jenkins stopped by the office to schedule surgery.

## 2017-07-09 NOTE — Telephone Encounter (Signed)
Patient said she has been trying to leave messages but it cuts her off before she finishes. She just wanted to make sure you knew she needed to schedule surgery.  Mobile #  [phone]  Work # [phone]

## 2017-07-15 NOTE — Pre-Procedure Instructions (Signed)
Jamie Jenkins  07/15/2017      [id number] - [place], [place] [address] [place] [address] Phone: [phone] Fax: [phone]    Your procedure is scheduled on Jul 21, 2017.  Report to [facility] Admitting at 800 AM.  Call this number if you have problems the morning of surgery:  [phone]  Continue all medications as directed by your physician except follow these medication instructions before surgery below   Remember:  Do not eat food or drink liquids after midnight.  Take these medicines the morning of surgery with A SIP OF WATER (none).  7 days prior to surgery STOP taking any meoxicam (mobic), Aspirin (unless otherwise instructed by your surgeon), Aleve, Naproxen, Ibuprofen, Motrin, Advil, Goody's, BC's, all herbal medications, fish oil, and all vitamins   Do not wear jewelry, make-up or nail polish.  Do not wear lotions, powders, or perfumes, or deodorant.  Do not shave 48 hours prior to surgery.    Do not bring valuables to the hospital.  [facility] is not responsible for any belongings or valuables.  Contacts, dentures or bridgework may not be worn into surgery.  Leave your suitcase in the car.  After surgery it may be brought to your room.  For patients admitted to the hospital, discharge time will be determined by your treatment team.  Patients discharged the day of surgery will not be allowed to drive home.    [facility]- Preparing For Surgery  Before surgery, you can play an important role. Because skin is not sterile, your skin needs to be as free of germs as possible. You can reduce the number of germs on your skin by washing with CHG (chlorahexidine gluconate) Soap before surgery.  CHG is an antiseptic cleaner which kills germs and bonds with the skin to continue killing germs even after washing.  Please do not use if you have an allergy to  CHG or antibacterial soaps. If your skin becomes reddened/irritated stop using the CHG.  Do not shave (including legs and underarms) for at least 48 hours prior to first CHG shower. It is OK to shave your face.  Please follow these instructions carefully.   1. Shower the NIGHT BEFORE SURGERY and the MORNING OF SURGERY with CHG.   2. If you chose to wash your hair, wash your hair first as usual with your normal shampoo.  3. After you shampoo, rinse your hair and body thoroughly to remove the shampoo.  4. Use CHG as you would any other liquid soap. You can apply CHG directly to the skin and wash gently with a scrungie or a clean washcloth.   5. Apply the CHG Soap to your body ONLY FROM THE NECK DOWN.  Do not use on open wounds or open sores. Avoid contact with your eyes, ears, mouth and genitals (private parts). Wash Face and genitals (private parts)  with your normal soap.  6. Wash thoroughly, paying special attention to the area where your surgery will be performed.  7. Thoroughly rinse your body with warm water from the neck down.  8. DO NOT shower/wash with your normal soap after using and rinsing off the CHG Soap.  9. Pat yourself dry with a CLEAN TOWEL.  10. Wear CLEAN PAJAMAS to bed the night before surgery, wear comfortable clothes the morning of surgery  11. Place CLEAN SHEETS on your bed the  night of your first shower and DO NOT SLEEP WITH PETS.  Day of Surgery: Do not apply any deodorants/lotions. Please wear clean clothes to the hospital/surgery center.    Please read over the following fact sheets that you were given. Pain Booklet, Coughing and Deep Breathing and Surgical Site Infection Prevention

## 2017-07-16 ENCOUNTER — Encounter (HOSPITAL_COMMUNITY): Payer: Self-pay

## 2017-07-16 ENCOUNTER — Encounter (HOSPITAL_COMMUNITY)
Admission: RE | Admit: 2017-07-16 | Discharge: 2017-07-16 | Disposition: A | Discharge status: Home / Self Care | Payer: BLUE CROSS/BLUE SHIELD | Source: Ambulatory Visit | Attending: Orthopedic Surgery | Admitting: Orthopedic Surgery

## 2017-07-16 ENCOUNTER — Other Ambulatory Visit: Payer: Self-pay

## 2017-07-16 DIAGNOSIS — Z01812 Encounter for preprocedural laboratory examination: Secondary | ICD-10-CM | POA: Diagnosis not present

## 2017-07-16 LAB — CBC
HCT: 41.5 % (ref 36.0–46.0)
Hemoglobin: 14.2 g/dL (ref 12.0–15.0)
MCH: 33.9 pg (ref 26.0–34.0)
MCHC: 34.2 g/dL (ref 30.0–36.0)
MCV: 99 fL (ref 78.0–100.0)
PLATELETS: 272 10*3/uL (ref 150–400)
RBC: 4.19 MIL/uL (ref 3.87–5.11)
RDW: 12.9 % (ref 11.5–15.5)
WBC: 8.8 10*3/uL (ref 4.0–10.5)

## 2017-07-16 LAB — COMPREHENSIVE METABOLIC PANEL
ALBUMIN: 3.9 g/dL (ref 3.5–5.0)
ALT: 17 U/L (ref 14–54)
AST: 20 U/L (ref 15–41)
Alkaline Phosphatase: 115 U/L (ref 38–126)
Anion gap: 10 (ref 5–15)
BILIRUBIN TOTAL: 0.5 mg/dL (ref 0.3–1.2)
BUN: 8 mg/dL (ref 6–20)
CHLORIDE: 101 mmol/L (ref 101–111)
CO2: 27 mmol/L (ref 22–32)
CREATININE: 0.89 mg/dL (ref 0.44–1.00)
Calcium: 9.6 mg/dL (ref 8.9–10.3)
GFR calc Af Amer: >60 mL/min (ref >60)
GLUCOSE: 114 mg/dL — AB (ref 65–99)
POTASSIUM: 3.7 mmol/L (ref 3.5–5.1)
Sodium: 138 mmol/L (ref 135–145)
Total Protein: 7.5 g/dL (ref 6.5–8.1)

## 2017-07-16 LAB — APTT: aPTT: 27 seconds (ref 24–36)

## 2017-07-16 LAB — PROTIME-INR
INR: 1.01
Prothrombin Time: 13.2 seconds (ref 11.4–15.2)

## 2017-07-16 NOTE — Progress Notes (Signed)
PCP - Dr. Emilia Beck Cardiologist - patient denies  Chest x-ray - 05/21/2017 EKG - 05/20/2017 Stress Test - patient denies ECHO - 06/11/2015 Cardiac Cath - patient denies  Sleep Study - patient denies  Anesthesia review: n/a  Patient denies shortness of breath, fever, cough and chest pain at PAT appointment   Patient verbalized understanding of instructions that were given to them at the PAT appointment. Patient was also instructed that they will need to review over the PAT instructions again at home before surgery.

## 2017-07-21 ENCOUNTER — Ambulatory Visit (HOSPITAL_COMMUNITY): Payer: BLUE CROSS/BLUE SHIELD | Admitting: Certified Registered"

## 2017-07-21 ENCOUNTER — Encounter (HOSPITAL_COMMUNITY)
Admission: RE | Disposition: A | Discharge status: Home / Self Care | Payer: Self-pay | Source: Ambulatory Visit | Attending: Orthopedic Surgery

## 2017-07-21 ENCOUNTER — Ambulatory Visit (HOSPITAL_COMMUNITY)
Admission: RE | Admit: 2017-07-21 | Discharge: 2017-07-21 | Disposition: A | Discharge status: Home / Self Care | Payer: BLUE CROSS/BLUE SHIELD | Source: Ambulatory Visit | Attending: Orthopedic Surgery | Admitting: Orthopedic Surgery

## 2017-07-21 ENCOUNTER — Encounter (HOSPITAL_COMMUNITY): Payer: Self-pay | Admitting: *Deleted

## 2017-07-21 DIAGNOSIS — M76822 Posterior tibial tendinitis, left leg: Secondary | ICD-10-CM | POA: Diagnosis not present

## 2017-07-21 DIAGNOSIS — Z79899 Other long term (current) drug therapy: Secondary | ICD-10-CM | POA: Diagnosis not present

## 2017-07-21 DIAGNOSIS — I10 Essential (primary) hypertension: Secondary | ICD-10-CM | POA: Diagnosis not present

## 2017-07-21 DIAGNOSIS — M5136 Other intervertebral disc degeneration, lumbar region: Secondary | ICD-10-CM | POA: Diagnosis not present

## 2017-07-21 DIAGNOSIS — G8918 Other acute postprocedural pain: Secondary | ICD-10-CM | POA: Diagnosis not present

## 2017-07-21 HISTORY — PX: ANKLE FUSION: SHX5718

## 2017-07-21 SURGERY — ANKLE FUSION
Anesthesia: General | Laterality: Left

## 2017-07-21 MED ORDER — MIDAZOLAM HCL 2 MG/2ML IJ SOLN
INTRAMUSCULAR | Status: DC | PRN
  Administered 2017-07-21 (×2): 1 mg via INTRAVENOUS

## 2017-07-21 MED ORDER — HYDROMORPHONE HCL 2 MG/ML IJ SOLN
INTRAMUSCULAR | Status: AC
  Filled 2017-07-21: qty 1

## 2017-07-21 MED ORDER — KETOROLAC TROMETHAMINE 30 MG/ML IJ SOLN: 30.0000 mg | Freq: Once | INTRAMUSCULAR | Status: DC | PRN

## 2017-07-21 MED ORDER — LIDOCAINE-EPINEPHRINE (PF) 1.5 %-1:200000 IJ SOLN
INTRAMUSCULAR | Status: DC | PRN
  Administered 2017-07-21: 10 mL via PERINEURAL

## 2017-07-21 MED ORDER — EPHEDRINE SULFATE-NACL 50-0.9 MG/10ML-% IV SOSY
PREFILLED_SYRINGE | INTRAVENOUS | Status: DC | PRN
  Administered 2017-07-21: 15 mg via INTRAVENOUS
  Administered 2017-07-21: 10 mg via INTRAVENOUS

## 2017-07-21 MED ORDER — PHENYLEPHRINE 40 MCG/ML (10ML) SYRINGE FOR IV PUSH (FOR BLOOD PRESSURE SUPPORT)
PREFILLED_SYRINGE | INTRAVENOUS | Status: AC
  Filled 2017-07-21: qty 10

## 2017-07-21 MED ORDER — LACTATED RINGERS IV SOLN
INTRAVENOUS | Status: DC
  Administered 2017-07-21: 08:00:00 via INTRAVENOUS

## 2017-07-21 MED ORDER — PROMETHAZINE HCL 25 MG/ML IJ SOLN: 6.2500 mg | INTRAMUSCULAR | Status: DC | PRN

## 2017-07-21 MED ORDER — LIDOCAINE 2% (20 MG/ML) 5 ML SYRINGE
INTRAMUSCULAR | Status: AC
  Filled 2017-07-21: qty 5

## 2017-07-21 MED ORDER — ONDANSETRON HCL 4 MG/2ML IJ SOLN
INTRAMUSCULAR | Status: AC
  Filled 2017-07-21: qty 2

## 2017-07-21 MED ORDER — PROPOFOL 10 MG/ML IV BOLUS
INTRAVENOUS | Status: DC | PRN
  Administered 2017-07-21: 200 mg via INTRAVENOUS

## 2017-07-21 MED ORDER — DEXAMETHASONE SODIUM PHOSPHATE 10 MG/ML IJ SOLN
INTRAMUSCULAR | Status: AC
  Filled 2017-07-21: qty 1

## 2017-07-21 MED ORDER — PHENYLEPHRINE 40 MCG/ML (10ML) SYRINGE FOR IV PUSH (FOR BLOOD PRESSURE SUPPORT)
PREFILLED_SYRINGE | INTRAVENOUS | Status: DC | PRN
  Administered 2017-07-21: 80 ug via INTRAVENOUS
  Administered 2017-07-21: 120 ug via INTRAVENOUS
  Administered 2017-07-21: 80 ug via INTRAVENOUS
  Administered 2017-07-21: 120 ug via INTRAVENOUS

## 2017-07-21 MED ORDER — MIDAZOLAM HCL 2 MG/2ML IJ SOLN
INTRAMUSCULAR | Status: AC
  Administered 2017-07-21: 2 mg via INTRAVENOUS
  Filled 2017-07-21: qty 2

## 2017-07-21 MED ORDER — EPHEDRINE 5 MG/ML INJ
INTRAVENOUS | Status: AC
  Filled 2017-07-21: qty 10

## 2017-07-21 MED ORDER — FENTANYL CITRATE (PF) 100 MCG/2ML IJ SOLN
INTRAMUSCULAR | Status: AC
  Administered 2017-07-21: 50 ug via INTRAVENOUS
  Filled 2017-07-21: qty 2

## 2017-07-21 MED ORDER — ONDANSETRON HCL 4 MG/2ML IJ SOLN
INTRAMUSCULAR | Status: DC | PRN
  Administered 2017-07-21: 4 mg via INTRAVENOUS

## 2017-07-21 MED ORDER — MIDAZOLAM HCL 2 MG/2ML IJ SOLN
INTRAMUSCULAR | Status: AC
  Filled 2017-07-21: qty 2

## 2017-07-21 MED ORDER — 0.9 % SODIUM CHLORIDE (POUR BTL) OPTIME
TOPICAL | Status: DC | PRN
  Administered 2017-07-21: 1000 mL

## 2017-07-21 MED ORDER — HYDROMORPHONE HCL 2 MG/ML IJ SOLN
0.2500 mg | INTRAMUSCULAR | Status: DC | PRN
  Administered 2017-07-21: 0.5 mg via INTRAVENOUS

## 2017-07-21 MED ORDER — CEFAZOLIN SODIUM-DEXTROSE 2-4 GM/100ML-% IV SOLN
2.0000 g | INTRAVENOUS | Status: AC
  Administered 2017-07-21: 2 g via INTRAVENOUS
  Filled 2017-07-21: qty 100

## 2017-07-21 MED ORDER — PROPOFOL 10 MG/ML IV BOLUS
INTRAVENOUS | Status: AC
  Filled 2017-07-21: qty 20

## 2017-07-21 MED ORDER — MIDAZOLAM HCL 2 MG/2ML IJ SOLN
2.0000 mg | Freq: Once | INTRAMUSCULAR | Status: AC
  Administered 2017-07-21: 2 mg via INTRAVENOUS

## 2017-07-21 MED ORDER — FENTANYL CITRATE (PF) 250 MCG/5ML IJ SOLN
INTRAMUSCULAR | Status: AC
  Filled 2017-07-21: qty 5

## 2017-07-21 MED ORDER — ROPIVACAINE HCL 5 MG/ML IJ SOLN
INTRAMUSCULAR | Status: DC | PRN
  Administered 2017-07-21: 30 mL via PERINEURAL

## 2017-07-21 MED ORDER — CHLORHEXIDINE GLUCONATE 4 % EX LIQD: 60.0000 mL | Freq: Once | CUTANEOUS | Status: DC

## 2017-07-21 MED ORDER — OXYCODONE-ACETAMINOPHEN 5-325 MG PO TABS: 1.0000 | ORAL_TABLET | ORAL | 0 refills | Status: DC | PRN

## 2017-07-21 MED ORDER — FENTANYL CITRATE (PF) 250 MCG/5ML IJ SOLN
INTRAMUSCULAR | Status: DC | PRN
  Administered 2017-07-21 (×2): 50 ug via INTRAVENOUS

## 2017-07-21 MED ORDER — LIDOCAINE 2% (20 MG/ML) 5 ML SYRINGE
INTRAMUSCULAR | Status: DC | PRN
  Administered 2017-07-21: 60 mg via INTRAVENOUS

## 2017-07-21 MED ORDER — DEXAMETHASONE SODIUM PHOSPHATE 10 MG/ML IJ SOLN
INTRAMUSCULAR | Status: DC | PRN
  Administered 2017-07-21: 10 mg via INTRAVENOUS

## 2017-07-21 MED ORDER — FENTANYL CITRATE (PF) 100 MCG/2ML IJ SOLN
50.0000 ug | Freq: Once | INTRAMUSCULAR | Status: AC
  Administered 2017-07-21: 50 ug via INTRAVENOUS

## 2017-07-21 SURGICAL SUPPLY — 46 items
BANDAGE ESMARK 6X9 LF (GAUZE/BANDAGES/DRESSINGS) ×1 IMPLANT
BIT DRILL KIT 2.65MM (BIT) ×1 IMPLANT
BLADE SAW SGTL HD 18.5X60.5X1. (BLADE) ×2 IMPLANT
BLADE SURG 10 STRL SS (BLADE) IMPLANT
BNDG COHESIVE 4X5 TAN STRL (GAUZE/BANDAGES/DRESSINGS) ×2 IMPLANT
BNDG COHESIVE 6X5 TAN STRL LF (GAUZE/BANDAGES/DRESSINGS) ×2 IMPLANT
BNDG ESMARK 6X9 LF (GAUZE/BANDAGES/DRESSINGS) ×2
BNDG GAUZE ELAST 4 BULKY (GAUZE/BANDAGES/DRESSINGS) ×4 IMPLANT
COVER MAYO STAND STRL (DRAPES) IMPLANT
COVER SURGICAL LIGHT HANDLE (MISCELLANEOUS) ×4 IMPLANT
DRAPE OEC MINIVIEW 54X84 (DRAPES) IMPLANT
DRAPE U-SHAPE 47X51 STRL (DRAPES) ×2 IMPLANT
DRILL KIT 2.65MM (BIT) ×2
DRSG ADAPTIC 3X8 NADH LF (GAUZE/BANDAGES/DRESSINGS) ×2 IMPLANT
DURAPREP 26ML APPLICATOR (WOUND CARE) ×2 IMPLANT
ELECT REM PT RETURN 9FT ADLT (ELECTROSURGICAL) ×2
ELECTRODE REM PT RTRN 9FT ADLT (ELECTROSURGICAL) ×1 IMPLANT
GAUZE SPONGE 4X4 12PLY STRL (GAUZE/BANDAGES/DRESSINGS) ×2 IMPLANT
GLOVE BIOGEL PI IND STRL 9 (GLOVE) ×1 IMPLANT
GLOVE BIOGEL PI INDICATOR 9 (GLOVE) ×1
GLOVE SURG ORTHO 9.0 STRL STRW (GLOVE) ×2 IMPLANT
GOWN STRL REUS W/ TWL LRG LVL3 (GOWN DISPOSABLE) ×1 IMPLANT
GOWN STRL REUS W/ TWL XL LVL3 (GOWN DISPOSABLE) ×1 IMPLANT
GOWN STRL REUS W/TWL LRG LVL3 (GOWN DISPOSABLE) ×2
GOWN STRL REUS W/TWL XL LVL3 (GOWN DISPOSABLE) ×2
GUIDEWIRE THREADED 2.8 (WIRE) ×2 IMPLANT
IMPL SPEED TITAN 20X15S15 (Orthopedic Implant) ×1 IMPLANT
IMPL SPEED TITAN 20X20X20 (Orthopedic Implant) ×1 IMPLANT
IMPLANT SPEED TITAN 20X15S15 (Orthopedic Implant) ×2 IMPLANT
IMPLANT SPEED TITAN 20X20X20 (Orthopedic Implant) ×2 IMPLANT
KIT BASIN OR (CUSTOM PROCEDURE TRAY) ×2 IMPLANT
KIT TURNOVER KIT B (KITS) ×2 IMPLANT
NS IRRIG 1000ML POUR BTL (IV SOLUTION) ×2 IMPLANT
PACK ORTHO EXTREMITY (CUSTOM PROCEDURE TRAY) ×2 IMPLANT
PAD ARMBOARD 7.5X6 YLW CONV (MISCELLANEOUS) ×4 IMPLANT
PADDING CAST COTTON 6X4 STRL (CAST SUPPLIES) ×2 IMPLANT
PUTTY BONE DBX 5CC MIX (Putty) ×2 IMPLANT
SCREW 6.5X70MM (Screw) ×2 IMPLANT
SPONGE LAP 18X18 X RAY DECT (DISPOSABLE) ×2 IMPLANT
SUCTION FRAZIER HANDLE 10FR (MISCELLANEOUS) ×1
SUCTION TUBE FRAZIER 10FR DISP (MISCELLANEOUS) ×1 IMPLANT
SUT ETHILON 2 0 PSLX (SUTURE) ×6 IMPLANT
TOWEL OR 17X24 6PK STRL BLUE (TOWEL DISPOSABLE) ×2 IMPLANT
TOWEL OR 17X26 10 PK STRL BLUE (TOWEL DISPOSABLE) ×2 IMPLANT
TUBE CONNECTING 12X1/4 (SUCTIONS) ×2 IMPLANT
WATER STERILE IRR 1000ML POUR (IV SOLUTION) ×2 IMPLANT

## 2017-07-21 NOTE — Anesthesia Procedure Notes (Signed)
Anesthesia Procedure Image    

## 2017-07-21 NOTE — Progress Notes (Signed)
Orthopedic Tech Progress Note Patient Details:  Jamie Jenkins [birth year] [id number]  Ortho Devices Type of Ortho Device: Crutches, Postop shoe/boot Ortho Device/Splint Location: lle Ortho Device/Splint Interventions: Application   Post Interventions Patient Tolerated: Well Instructions Provided: Care of device Viewed order from doctor's order list  Hildred Priest 07/21/2017, 11:48 AM

## 2017-07-21 NOTE — Anesthesia Postprocedure Evaluation (Signed)
Anesthesia Post Note  Patient: Jamie Jenkins  Procedure(s) Performed: Gwyneth Revels AND TALONAVICULAR FUSION LEFT FOOT (Left )     Patient location during evaluation: PACU Anesthesia Type: General Level of consciousness: awake and alert Pain management: pain level controlled Vital Signs Assessment: post-procedure vital signs reviewed and stable Respiratory status: spontaneous breathing, nonlabored ventilation, respiratory function stable and patient connected to nasal cannula oxygen Cardiovascular status: blood pressure returned to baseline and stable Postop Assessment: no apparent nausea or vomiting Anesthetic complications: no    Last Vitals:  Vitals:   07/21/17 1114 07/21/17 1129  BP: 133/77 122/76  Pulse:    Resp: 16 15  Temp:    SpO2: 100% 100%    Last Pain:  Vitals:   07/21/17 1100  TempSrc:   PainSc: 0-No pain                 Nhat Hearne S

## 2017-07-21 NOTE — Anesthesia Procedure Notes (Signed)
Procedure Name: LMA Insertion Date/Time: 07/21/2017 10:02 AM Performed by: Barrington Ellison, CRNA Pre-anesthesia Checklist: Patient identified, Emergency Drugs available, Suction available and Patient being monitored Patient Re-evaluated:Patient Re-evaluated prior to induction Oxygen Delivery Method: Circle System Utilized Preoxygenation: Pre-oxygenation with 100% oxygen Induction Type: IV induction Ventilation: Mask ventilation without difficulty LMA: LMA inserted LMA Size: 4.0 Number of attempts: 1 Placement Confirmation: positive ETCO2 Tube secured with: Tape Dental Injury: Teeth and Oropharynx as per pre-operative assessment

## 2017-07-21 NOTE — Anesthesia Procedure Notes (Signed)
Anesthesia Regional Block: Popliteal block   Pre-Anesthetic Checklist: ,, timeout performed, Correct Patient, Correct Site, Correct Laterality, Correct Procedure, Correct Position, site marked, Risks and benefits discussed,  Surgical consent,  Pre-op evaluation,  At surgeon's request and post-op pain management  Laterality: Left  Prep: chloraprep       Needles:  Injection technique: Single-shot  Needle Type: Echogenic Needle     Needle Length: 9cm      Additional Needles:   Procedures:,,,, ultrasound used (permanent image in chart),,,,  Narrative:  Start time: 07/21/2017 9:05 AM End time: 07/21/2017 9:14 AM Injection made incrementally with aspirations every 5 mL.  Performed by: Personally  Anesthesiologist: Myrtie Soman, MD  Additional Notes: Patient tolerated the procedure well without complications

## 2017-07-21 NOTE — Anesthesia Preprocedure Evaluation (Addendum)
Anesthesia Evaluation  Patient identified by MRN, date of birth, ID band Patient awake    Reviewed: Allergy & Precautions, NPO status , Patient's Chart, lab work & pertinent test results  Airway Mallampati: II  TM Distance: >3 FB Neck ROM: Full    Dental no notable dental hx. (+) Missing, Poor Dentition, Teeth Intact, Chipped, Dental Advisory Given   Pulmonary neg pulmonary ROS,    Pulmonary exam normal breath sounds clear to auscultation       Cardiovascular hypertension, Normal cardiovascular exam Rhythm:Regular Rate:Normal     Neuro/Psych negative neurological ROS  negative psych ROS   GI/Hepatic negative GI ROS, Neg liver ROS,   Endo/Other  negative endocrine ROS  Renal/GU negative Renal ROS  negative genitourinary   Musculoskeletal negative musculoskeletal ROS (+)   Abdominal   Peds negative pediatric ROS (+)  Hematology negative hematology ROS (+)   Anesthesia Other Findings   Reproductive/Obstetrics negative OB ROS                            Anesthesia Physical Anesthesia Plan  ASA: II  Anesthesia Plan: General   Post-op Pain Management:  Regional for Post-op pain   Induction: Intravenous  PONV Risk Score and Plan: 3 and Ondansetron, Dexamethasone and Treatment may vary due to age or medical condition  Airway Management Planned: LMA  Additional Equipment:   Intra-op Plan:   Post-operative Plan: Extubation in OR  Informed Consent: I have reviewed the patients History and Physical, chart, labs and discussed the procedure including the risks, benefits and alternatives for the proposed anesthesia with the patient or authorized representative who has indicated his/her understanding and acceptance.   Dental advisory given  Plan Discussed with: CRNA and Surgeon  Anesthesia Plan Comments:         Anesthesia Quick Evaluation

## 2017-07-21 NOTE — Transfer of Care (Signed)
Immediate Anesthesia Transfer of Care Note  Patient: Jamie Jenkins  Procedure(s) Performed: Gwyneth Revels AND TALONAVICULAR FUSION LEFT FOOT (Left )  Patient Location: PACU  Anesthesia Type:General  Level of Consciousness: awake  Airway & Oxygen Therapy: Patient Spontanous Breathing and Patient connected to nasal cannula oxygen  Post-op Assessment: Report given to RN  Post vital signs: Reviewed and stable  Last Vitals:  Vitals Value Taken Time  BP    Temp    Pulse    Resp    SpO2      Last Pain:  Vitals:   07/21/17 0802  TempSrc:   PainSc: 8       Patients Stated Pain Goal: 3 (67/89/38 1017)  Complications: No apparent anesthesia complications

## 2017-07-21 NOTE — Op Note (Signed)
07/21/2017  10:56 AM  PATIENT:  Jamie Jenkins    PRE-OPERATIVE DIAGNOSIS:  Posterior Tibial Tendon Insufficiency Left Foot  POST-OPERATIVE DIAGNOSIS:  Same  PROCEDURE:  SUBTALAR AND TALONAVICULAR FUSION LEFT FOOT  SURGEON:  Newt Minion, MD  PHYSICIAN ASSISTANT:None ANESTHESIA:   General  PREOPERATIVE INDICATIONS:  [name] is a  57 y.o. female with a diagnosis of Posterior Tibial Tendon Insufficiency Left Foot who failed conservative measures and elected for surgical management.    The risks benefits and alternatives were discussed with the patient preoperatively including but not limited to the risks of infection, bleeding, nerve injury, cardiopulmonary complications, the need for revision surgery, among others, and the patient was willing to proceed.  OPERATIVE IMPLANTS: Synthes 6.5 headless screw x1 and staples x2  @ENCIMAGES @  OPERATIVE FINDINGS: Radiographs confirmed alignment of the talonavicular and subtalar fusion.  OPERATIVE PROCEDURE: Patient was brought the operating room after undergoing a popliteal block she then underwent a general anesthetic.  After adequate levels of anesthesia were obtained patient's left lower extremity was prepped using DuraPrep draped in the sterile field a timeout was called.  A oblique incision was first made over the sinus Tarsi.  The extensor muscles were elevated from their origin.  Retractors were placed to protect the peroneal tendons.  Using a bur the subtalar joint was debrided of articular cartilage.  This was debrided back to good bleeding bone.  This was irrigated with normal saline.  Attention was then focused on the talonavicular joint.  A dorsal his incision was made this was carried down through the interval between the EHL and anterior tibial tendon.  This was carried sharply down to the talonavicular joint and the burn was used to debride the talonavicular joint.  The joints were then reduced the fusion sites were packed with 5  cc demineralized bone matrix with putty.  The foot was held in slight valgus plantigrade at 90 degrees and a guidewire was inserted from the calcaneus into the talus.  C-arm fluoroscopy verified alignment and this was stabilized with a 6.5 headless screw.  Attention was then focused in the talonavicular joint.  The foot was held plantigrade at 90 degrees and 2 staples were placed to secure the talonavicular joint.  The wounds were irrigated with normal saline hemostasis was obtained the incisions were closed using 2-0 nylon.  A sterile dressing was applied patient was extubated taken to PACU in stable condition.   DISCHARGE PLANNING:  Antibiotic duration: Perioperative  Weightbearing: Nonweightbearing on the left  Pain medication: Prescription for Percocet  Dressing care/ Wound VAC: Follow-up in the office to change the dressing  Ambulatory devices: Crutches walker or kneeling scooter  Discharge to: Home.  Follow-up: In the office 1 week post operative.

## 2017-07-21 NOTE — H&P (Signed)
Jamie Jenkins is an 57 y.o. female.   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  She states she has had symptoms for over 10 years recently she has been treated with conservative bracing for over 5 months without relief.  Patient has used anti-inflammatories without relief.    Past Medical History:  Diagnosis Date  . DDD (degenerative disc disease), lumbar   . Gall stones   . Hypertension     Past Surgical History:  Procedure Laterality Date  . AXILLARY LYMPH NODE DISSECTION Left 11/24/2012   Procedure: incision and drainage axillary abscess;  Surgeon: Adin Hector, MD;  Location: [facility];  Service: General;  Laterality: Left;  . BREAST SURGERY  2012   breast biopsy  . CHOLECYSTECTOMY    . TUBAL LIGATION      Family History  Problem Relation Age of Onset  . Pancreatic cancer Mother        Deceased  . Hypertension Father   . Deep vein thrombosis Brother        Deceased  . Healthy Sister   . Healthy Son   . Healthy Daughter    Social History:  reports that she has never smoked. She has never used smokeless tobacco. She reports that she does not drink alcohol or use drugs.  Allergies: No Known Allergies  No medications prior to admission.    No results found for this or any previous visit (from the past 48 hour(s)). No results found.  Review of Systems  All other systems reviewed and are negative.   Last menstrual period 08/23/2012. Physical Exam   Assessment/Plan 1. Posterior tibial tendinitis, left leg     Plan: Discussed with the patient surgical intervention option would be to proceed to subtalar and talonavicular fusion.  Discussed that she would lose her subtalar motion she would have to be completely off her foot for at least 4 weeks that she would need assistance at home to provide meals and provide support.  Discussed risks of surgery including  infection neurovascular injury persistent pain.  Patient states she understands wished to proceed with surgery at this time we will plan for outpatient surgery at [facility].     Newt Minion, MD 07/21/2017, 6:32 AM

## 2017-07-21 NOTE — Anesthesia Procedure Notes (Deleted)
Central Venous Catheter Insertion Performed by: Myrtie Soman, MD, anesthesiologist Start/End5/10/2017 7:44 AM, 07/21/2017 8:00 AM Patient location: Pre-op. Preanesthetic checklist: patient identified, IV checked, site marked, risks and benefits discussed, surgical consent, monitors and equipment checked, pre-op evaluation, timeout performed and anesthesia consent Position: Trendelenburg Lidocaine 1% used for infiltration and patient sedated Hand hygiene performed , maximum sterile barriers used  and Seldinger technique used Catheter size: 8 Fr Total catheter length 16. Central line was placed.Double lumen Procedure performed using ultrasound guided technique. Ultrasound Notes:anatomy identified, needle tip was noted to be adjacent to the nerve/plexus identified, no ultrasound evidence of intravascular and/or intraneural injection and image(s) printed for medical record Attempts: 1 Following insertion, dressing applied, line sutured and Biopatch. Post procedure assessment: blood return through all ports  Patient tolerated the procedure well with no immediate complications.

## 2017-07-22 ENCOUNTER — Encounter (HOSPITAL_COMMUNITY): Payer: Self-pay | Admitting: Orthopedic Surgery

## 2017-07-23 ENCOUNTER — Telehealth (INDEPENDENT_AMBULATORY_CARE_PROVIDER_SITE_OTHER): Payer: Self-pay

## 2017-07-23 ENCOUNTER — Telehealth (INDEPENDENT_AMBULATORY_CARE_PROVIDER_SITE_OTHER): Payer: Self-pay | Admitting: Orthopedic Surgery

## 2017-07-23 NOTE — Telephone Encounter (Signed)
Patient's husband called concerning pain medicine.  Advised him that a message has been sent to Dr. Sharol Given.

## 2017-07-23 NOTE — Telephone Encounter (Signed)
Patients husband called on behalf of patient wanting to let Dr. Sharol Given know that she still has a lot of pain and swelling, was wondering if she could be prescribed something to help? CB # [phone]

## 2017-07-23 NOTE — Telephone Encounter (Signed)
Please advise. Thanks.  

## 2017-07-23 NOTE — Telephone Encounter (Signed)
I called, elevate foot, use ice, remove coban, use ibuprophen 800 mg tid with meals, call if still painfull

## 2017-07-28 ENCOUNTER — Telehealth (INDEPENDENT_AMBULATORY_CARE_PROVIDER_SITE_OTHER): Payer: Self-pay | Admitting: Orthopedic Surgery

## 2017-07-28 NOTE — Telephone Encounter (Signed)
Patient called advised she need a toilet seat that goes over the toilet to make it higher so she can get up off the toilet. Patient also advised she need a shower chair. The number to contact patient is [phone]

## 2017-07-29 ENCOUNTER — Other Ambulatory Visit (INDEPENDENT_AMBULATORY_CARE_PROVIDER_SITE_OTHER): Payer: Self-pay

## 2017-07-29 MED ORDER — OXYCODONE-ACETAMINOPHEN 5-325 MG PO TABS: 1.0000 | ORAL_TABLET | Freq: Four times a day (QID) | ORAL | 0 refills | Status: DC | PRN

## 2017-07-29 NOTE — Telephone Encounter (Signed)
Med refill  Oxycodone-acetaminophen(Percocet/Roxciet)5-325 tablet    Pt called checking on the status of new toilet seat and shower chair as well. Pt stated she hasn't showered in a week would like a call back to discuss pt options.

## 2017-07-29 NOTE — Telephone Encounter (Signed)
Called pt to advise that order faxed to [facility] for commode and shower chair. Pt is s/p a left subtalar and talonavicular fusion on the left and is non weight bearing. I am not sure if this will be covered by insurance and advised pt of this and asked [facility] to contact pt direct to discuss. Dr. Sharol Given ok refill on pain medication this was signed and advised pt that this is available at the front desk for pick up.

## 2017-08-03 ENCOUNTER — Ambulatory Visit (INDEPENDENT_AMBULATORY_CARE_PROVIDER_SITE_OTHER): Payer: BLUE CROSS/BLUE SHIELD | Admitting: Orthopedic Surgery

## 2017-08-03 ENCOUNTER — Encounter (INDEPENDENT_AMBULATORY_CARE_PROVIDER_SITE_OTHER): Payer: Self-pay | Admitting: Orthopedic Surgery

## 2017-08-03 ENCOUNTER — Ambulatory Visit (INDEPENDENT_AMBULATORY_CARE_PROVIDER_SITE_OTHER): Payer: BLUE CROSS/BLUE SHIELD

## 2017-08-03 DIAGNOSIS — M76822 Posterior tibial tendinitis, left leg: Secondary | ICD-10-CM

## 2017-08-03 NOTE — Progress Notes (Signed)
Office Visit Note   Patient: Jamie Jenkins           Date of Birth: 1960-12-30           MRN: [id number] Visit Date: 08/03/2017              Requested by: [address], [place] [place], [place] [address] PCP: Golden Circle, FNP  Chief Complaint  Patient presents with  . Left Ankle - Routine Post Op      HPI: Patient is a 57 year old woman who presents 2 weeks status post talonavicular and subtalar fusion on the left.  Patient has been having increased swelling with dependency and did have the outside dressing removed at home.  Assessment & Plan: Visit Diagnoses:  1. Posterior tibial tendon dysfunction (PTTD) of left lower extremity     Plan: Discussed the importance of elevation and ice with her foot level with the heart.  He will start Dial soap cleansing daily dry dressing change Ace wrap use ice 3 times a day and work on dorsiflexion exercises of the ankle daily.  Follow-Up Instructions: Return in about 1 week (around 08/10/2017).   Ortho Exam  Patient is alert, oriented, no adenopathy, well-dressed, normal affect, normal respiratory effort. Examination patient has significant swelling with gaping of the incisions with hematoma.  There is no redness no cellulitis no odor no signs of infection.  She is developing equinus contracture and the importance of dorsiflexion stretching was discussed dry dressing was applied she will start Dial soap cleansing elevation ice Achilles stretching.  Imaging: No results found. No images are attached to the encounter.  Labs: Lab Results  Component Value Date   HGBA1C 5.5 06/23/2016     Lab Results  Component Value Date   ALBUMIN 3.9 07/16/2017   ALBUMIN 4.3 06/23/2016   ALBUMIN 4.3 06/06/2015    There is no height or weight on file to calculate BMI.  Orders:  Orders Placed This Encounter  Procedures  . XR Ankle Complete Left   No orders of the defined types were placed in this  encounter.    Procedures: No procedures performed  Clinical Data: No additional findings.  ROS:  All other systems negative, except as noted in the HPI. Review of Systems  Objective: Vital Signs: LMP 08/23/2012   Specialty Comments:  No specialty comments available.  PMFS History: Patient Active Problem List   Diagnosis Date Noted  . Posterior tibial tendon dysfunction (PTTD) of left lower extremity   . Posterior tibial tendinitis, left leg 07/05/2017  . Left foot pain 06/18/2017  . Chest pain in adult 05/20/2017  . Obesity 06/08/2016  . Routine adult health maintenance 06/06/2015  . Multiple falls 06/06/2015  . Contusion of left leg 06/06/2015  . Faintness 06/06/2015  . Cough 08/24/2014  . DDD (degenerative disc disease), lumbar 05/21/2014  . Essential hypertension 05/21/2014  . Right leg pain 04/02/2014  . Generalized headaches 04/02/2014  . Abscess of axilla, left 11/21/2012   Past Medical History:  Diagnosis Date  . DDD (degenerative disc disease), lumbar   . Gall stones   . Hypertension     Family History  Problem Relation Age of Onset  . Pancreatic cancer Mother        Deceased  . Hypertension Father   . Deep vein thrombosis Brother        Deceased  . Healthy Sister   . Healthy Son   . Healthy Daughter  Past Surgical History:  Procedure Laterality Date  . ANKLE FUSION Left 07/21/2017   Procedure: SUBTALAR AND TALONAVICULAR FUSION LEFT FOOT;  Surgeon: Newt Minion, MD;  Location: [facility];  Service: Orthopedics;  Laterality: Left;  . AXILLARY LYMPH NODE DISSECTION Left 11/24/2012   Procedure: incision and drainage axillary abscess;  Surgeon: Adin Hector, MD;  Location: [facility];  Service: General;  Laterality: Left;  . BREAST SURGERY  2012   breast biopsy  . CHOLECYSTECTOMY    . TUBAL LIGATION     Social History   Occupational History  . Occupation: Scientist, research (medical)  Tobacco Use  . Smoking status: Never Smoker  . Smokeless tobacco:  Never Used  Substance and Sexual Activity  . Alcohol use: No    Alcohol/week: 0.0 oz  . Drug use: No  . Sexual activity: Not on file

## 2017-08-10 ENCOUNTER — Ambulatory Visit (INDEPENDENT_AMBULATORY_CARE_PROVIDER_SITE_OTHER): Payer: BLUE CROSS/BLUE SHIELD | Admitting: Orthopedic Surgery

## 2017-08-10 ENCOUNTER — Encounter (INDEPENDENT_AMBULATORY_CARE_PROVIDER_SITE_OTHER): Payer: Self-pay | Admitting: Orthopedic Surgery

## 2017-08-10 VITALS — Ht 66.0 in | Wt 205.0 lb

## 2017-08-10 DIAGNOSIS — M76822 Posterior tibial tendinitis, left leg: Secondary | ICD-10-CM

## 2017-08-10 MED ORDER — OXYCODONE-ACETAMINOPHEN 5-325 MG PO TABS: 1.0000 | ORAL_TABLET | Freq: Three times a day (TID) | ORAL | 0 refills | Status: DC | PRN

## 2017-08-10 NOTE — Progress Notes (Signed)
Office Visit Note   Patient: Jamie Jenkins           Date of Birth: April 16, 1960           MRN: [id number] Visit Date: 08/10/2017              Requested by: [facility], [address] [place], [place] [address] PCP: Golden Circle, FNP  Chief Complaint  Patient presents with  . Left Foot - Routine Post Op    07/21/17 left subtalar and talonavicular fusion       HPI: Patient presents in follow-up 3 weeks status post subtalar and talonavicular fusion.  Patient has no complaints.  Assessment & Plan: Visit Diagnoses:  1. Posterior tibial tendon dysfunction (PTTD) of left lower extremity     Plan: We will harvest the sutures today patient was given instructions to work on heel cord stretching.  Elevation continue Dial soap cleansing Shea butter to prevent keloiding and weightbearing as tolerated with her postoperative shoe.  Follow-up in 3 weeks with three-view radiographs of the left foot.  Follow-Up Instructions: Return in about 3 weeks (around 08/31/2017).   Ortho Exam  Patient is alert, oriented, no adenopathy, well-dressed, normal affect, normal respiratory effort. Examination patient does have swelling the wound edges are well approximated we will harvest the sutures today.  She will begin touchdown weightbearing with her postoperative shoe start scar massage.  Imaging: No results found. No images are attached to the encounter.  Labs: Lab Results  Component Value Date   HGBA1C 5.5 06/23/2016     Lab Results  Component Value Date   ALBUMIN 3.9 07/16/2017   ALBUMIN 4.3 06/23/2016   ALBUMIN 4.3 06/06/2015    Body mass index is 33.09 kg/m.  Orders:  No orders of the defined types were placed in this encounter.  Meds ordered this encounter  Medications  . oxyCODONE-acetaminophen (PERCOCET/ROXICET) 5-325 MG tablet    Sig: Take 1 tablet by mouth every 8 (eight) hours as needed.    Dispense:  20 tablet    Refill:  0      Procedures: No procedures performed  Clinical Data: No additional findings.  ROS:  All other systems negative, except as noted in the HPI. Review of Systems  Objective: Vital Signs: Ht 5\' 6"  (1.676 m)   Wt 205 lb (93 kg)   LMP 08/23/2012   BMI 33.09 kg/m   Specialty Comments:  No specialty comments available.  PMFS History: Patient Active Problem List   Diagnosis Date Noted  . Posterior tibial tendon dysfunction (PTTD) of left lower extremity   . Posterior tibial tendinitis, left leg 07/05/2017  . Left foot pain 06/18/2017  . Chest pain in adult 05/20/2017  . Obesity 06/08/2016  . Routine adult health maintenance 06/06/2015  . Multiple falls 06/06/2015  . Contusion of left leg 06/06/2015  . Faintness 06/06/2015  . Cough 08/24/2014  . DDD (degenerative disc disease), lumbar 05/21/2014  . Essential hypertension 05/21/2014  . Right leg pain 04/02/2014  . Generalized headaches 04/02/2014  . Abscess of axilla, left 11/21/2012   Past Medical History:  Diagnosis Date  . DDD (degenerative disc disease), lumbar   . Gall stones   . Hypertension     Family History  Problem Relation Age of Onset  . Pancreatic cancer Mother        Deceased  . Hypertension Father   . Deep vein thrombosis Brother  Deceased  . Healthy Sister   . Healthy Son   . Healthy Daughter     Past Surgical History:  Procedure Laterality Date  . ANKLE FUSION Left 07/21/2017   Procedure: SUBTALAR AND TALONAVICULAR FUSION LEFT FOOT;  Surgeon: Newt Minion, MD;  Location: [facility];  Service: Orthopedics;  Laterality: Left;  . AXILLARY LYMPH NODE DISSECTION Left 11/24/2012   Procedure: incision and drainage axillary abscess;  Surgeon: Adin Hector, MD;  Location: [facility];  Service: General;  Laterality: Left;  . BREAST SURGERY  2012   breast biopsy  . CHOLECYSTECTOMY    . TUBAL LIGATION     Social History   Occupational History  . Occupation: Scientist, research (medical)  Tobacco Use  . Smoking  status: Never Smoker  . Smokeless tobacco: Never Used  Substance and Sexual Activity  . Alcohol use: No    Alcohol/week: 0.0 oz  . Drug use: No  . Sexual activity: Not on file

## 2017-08-23 ENCOUNTER — Telehealth (INDEPENDENT_AMBULATORY_CARE_PROVIDER_SITE_OTHER): Payer: Self-pay | Admitting: Orthopedic Surgery

## 2017-08-23 NOTE — Telephone Encounter (Signed)
Called and sw pt advised that per last dictation she could begin to bear weight as she could tolerate in her post op shoe. Also advised that swelling is the last thing to resolve after surgery and can last up to six months. She should elevate periodically throughout the day and she can wear compression hose. Pt will call with any additional questions.

## 2017-08-23 NOTE — Telephone Encounter (Signed)
Patient called wanting to know if she can put pressure on her leg and walk possibly, and also wanted to know if she could be prescribed something for the swelling. CB # [phone]

## 2017-08-30 ENCOUNTER — Other Ambulatory Visit: Payer: Self-pay | Admitting: Family

## 2017-09-01 MED ORDER — HYDROCHLOROTHIAZIDE 25 MG PO TABS: 25.0000 mg | ORAL_TABLET | Freq: Every day | ORAL | 0 refills | Status: DC

## 2017-09-01 MED ORDER — LISINOPRIL 20 MG PO TABS: 20.0000 mg | ORAL_TABLET | Freq: Every day | ORAL | 0 refills | Status: DC

## 2017-09-01 NOTE — Addendum Note (Signed)
Addended by: Aviva Signs M on: 09/01/2017 10:21 AM   Modules accepted: Orders

## 2017-09-02 ENCOUNTER — Encounter (INDEPENDENT_AMBULATORY_CARE_PROVIDER_SITE_OTHER): Payer: Self-pay | Admitting: Orthopedic Surgery

## 2017-09-02 ENCOUNTER — Ambulatory Visit (INDEPENDENT_AMBULATORY_CARE_PROVIDER_SITE_OTHER): Payer: BLUE CROSS/BLUE SHIELD | Admitting: Orthopedic Surgery

## 2017-09-02 ENCOUNTER — Ambulatory Visit (INDEPENDENT_AMBULATORY_CARE_PROVIDER_SITE_OTHER): Payer: BLUE CROSS/BLUE SHIELD

## 2017-09-02 VITALS — Ht 66.0 in | Wt 205.0 lb

## 2017-09-02 DIAGNOSIS — M76822 Posterior tibial tendinitis, left leg: Secondary | ICD-10-CM | POA: Diagnosis not present

## 2017-09-02 MED ORDER — OXYCODONE-ACETAMINOPHEN 5-325 MG PO TABS: 1.0000 | ORAL_TABLET | Freq: Three times a day (TID) | ORAL | 0 refills | Status: DC | PRN

## 2017-09-02 NOTE — Progress Notes (Signed)
Office Visit Note   Patient: Jamie Jenkins           Date of Birth: 12-23-1960           MRN: [id number] Visit Date: 09/02/2017              Requested by: [facility], [address] [place] [place], [place] [address] PCP: Golden Circle, FNP  Chief Complaint  Patient presents with  . Left Foot - Routine Post Op    07/21/17 left subtalar and talonavicular fusion       HPI: Patient is a 57 year old woman who presents 6 weeks status post subtalar and talonavicular fusion.  Patient complains of swelling and pain.  Assessment & Plan: Visit Diagnoses:  1. Posterior tibial tendon dysfunction (PTTD) of left lower extremity     Plan: Patient is given a prescription for 15 to 20 mm knee-high compression stockings to be worn around-the-clock.  She is to slowly increase her activities as tolerated at follow-up in 4 weeks with repeat three-view radiographs of the left foot at which time anticipate she can be released to return to work.  Follow-Up Instructions: Return in about 1 month (around 09/30/2017).   Ortho Exam  Patient is alert, oriented, no adenopathy, well-dressed, normal affect, normal respiratory effort. Examination patient has a good pulse she does have increased swelling in the foot and ankle.  There is no redness no cellulitis no drainage no signs of infection.  The incisions are well-healed.  Imaging: Xr Ankle Complete Right  Result Date: 09/02/2017 3 view radiographs of the left ankle show stable talonavicular and subtalar fusion with no complicating features.  No images are attached to the encounter.  Labs: Lab Results  Component Value Date   HGBA1C 5.5 06/23/2016     Lab Results  Component Value Date   ALBUMIN 3.9 07/16/2017   ALBUMIN 4.3 06/23/2016   ALBUMIN 4.3 06/06/2015    Body mass index is 33.09 kg/m.  Orders:  Orders Placed This Encounter  Procedures  . XR Ankle Complete Right   Meds ordered this encounter  Medications  .  oxyCODONE-acetaminophen (PERCOCET/ROXICET) 5-325 MG tablet    Sig: Take 1 tablet by mouth every 8 (eight) hours as needed.    Dispense:  20 tablet    Refill:  0     Procedures: No procedures performed  Clinical Data: No additional findings.  ROS:  All other systems negative, except as noted in the HPI. Review of Systems  Objective: Vital Signs: Ht 5\' 6"  (1.676 m)   Wt 205 lb (93 kg)   LMP 08/23/2012   BMI 33.09 kg/m   Specialty Comments:  No specialty comments available.  PMFS History: Patient Active Problem List   Diagnosis Date Noted  . Posterior tibial tendon dysfunction (PTTD) of left lower extremity   . Posterior tibial tendinitis, left leg 07/05/2017  . Left foot pain 06/18/2017  . Chest pain in adult 05/20/2017  . Obesity 06/08/2016  . Routine adult health maintenance 06/06/2015  . Multiple falls 06/06/2015  . Contusion of left leg 06/06/2015  . Faintness 06/06/2015  . Cough 08/24/2014  . DDD (degenerative disc disease), lumbar 05/21/2014  . Essential hypertension 05/21/2014  . Right leg pain 04/02/2014  . Generalized headaches 04/02/2014  . Abscess of axilla, left 11/21/2012   Past Medical History:  Diagnosis Date  . DDD (degenerative disc disease), lumbar   . Gall stones   . Hypertension  Family History  Problem Relation Age of Onset  . Pancreatic cancer Mother        Deceased  . Hypertension Father   . Deep vein thrombosis Brother        Deceased  . Healthy Sister   . Healthy Son   . Healthy Daughter     Past Surgical History:  Procedure Laterality Date  . ANKLE FUSION Left 07/21/2017   Procedure: SUBTALAR AND TALONAVICULAR FUSION LEFT FOOT;  Surgeon: Newt Minion, MD;  Location: [facility];  Service: Orthopedics;  Laterality: Left;  . AXILLARY LYMPH NODE DISSECTION Left 11/24/2012   Procedure: incision and drainage axillary abscess;  Surgeon: Adin Hector, MD;  Location: [facility];  Service: General;  Laterality: Left;  . BREAST SURGERY   2012   breast biopsy  . CHOLECYSTECTOMY    . TUBAL LIGATION     Social History   Occupational History  . Occupation: Scientist, research (medical)  Tobacco Use  . Smoking status: Never Smoker  . Smokeless tobacco: Never Used  Substance and Sexual Activity  . Alcohol use: No    Alcohol/week: 0.0 oz  . Drug use: No  . Sexual activity: Not on file

## 2017-09-23 ENCOUNTER — Ambulatory Visit (INDEPENDENT_AMBULATORY_CARE_PROVIDER_SITE_OTHER): Payer: BLUE CROSS/BLUE SHIELD | Admitting: Orthopedic Surgery

## 2017-09-27 ENCOUNTER — Telehealth (INDEPENDENT_AMBULATORY_CARE_PROVIDER_SITE_OTHER): Payer: Self-pay | Admitting: Family

## 2017-09-27 MED ORDER — OXYCODONE-ACETAMINOPHEN 5-325 MG PO TABS: 1.0000 | ORAL_TABLET | Freq: Three times a day (TID) | ORAL | 0 refills | Status: DC | PRN

## 2017-09-27 NOTE — Telephone Encounter (Signed)
07/21/17 left subtalar and talonavicular fusion. Pt is requesting refill on Percocet 5/325. Last refill was 09/03/17 #20 can you please advise Dr. Sharol Given is out of the office this week.

## 2017-09-27 NOTE — Telephone Encounter (Signed)
I escribed some in

## 2017-09-27 NOTE — Telephone Encounter (Signed)
Called pt and she is aware that this has been faxed to the pharm.

## 2017-09-27 NOTE — Telephone Encounter (Signed)
Patient called needing Rx refilled (Oxycodone) The number to contact patient is [phone]

## 2017-10-04 ENCOUNTER — Encounter (INDEPENDENT_AMBULATORY_CARE_PROVIDER_SITE_OTHER): Payer: Self-pay | Admitting: Orthopedic Surgery

## 2017-10-04 ENCOUNTER — Ambulatory Visit (INDEPENDENT_AMBULATORY_CARE_PROVIDER_SITE_OTHER): Payer: BLUE CROSS/BLUE SHIELD

## 2017-10-04 ENCOUNTER — Telehealth: Payer: Self-pay

## 2017-10-04 ENCOUNTER — Ambulatory Visit (INDEPENDENT_AMBULATORY_CARE_PROVIDER_SITE_OTHER): Payer: BLUE CROSS/BLUE SHIELD | Admitting: Orthopedic Surgery

## 2017-10-04 VITALS — Ht 66.0 in | Wt 205.0 lb

## 2017-10-04 DIAGNOSIS — M76822 Posterior tibial tendinitis, left leg: Secondary | ICD-10-CM

## 2017-10-04 MED ORDER — LISINOPRIL 20 MG PO TABS: 20.0000 mg | ORAL_TABLET | Freq: Every day | ORAL | 0 refills | Status: DC

## 2017-10-04 MED ORDER — GABAPENTIN 300 MG PO CAPS: 300.0000 mg | ORAL_CAPSULE | Freq: Three times a day (TID) | ORAL | 3 refills | Status: DC

## 2017-10-04 NOTE — Progress Notes (Signed)
Office Visit Note   Patient: Jamie Jenkins           Date of Birth: 12-04-1960           MRN: [id number] Visit Date: 10/04/2017              Requested by: [address], [address] [place], [place] [address] PCP: Biagio Borg, MD  Chief Complaint  Patient presents with  . Left Foot - Routine Post Op, Follow-up    07/21/17  Subtalar and Talonavicular Fusion Left Foot      HPI: Patient is a 57 year old woman who is 2-1/2 months status post talonavicular and subtalar fusion.  She states that recently she dropped a fork on her foot and had some bleeding.  Patient states she has increased pain at the end of the day from increased activities.  Assessment & Plan: Visit Diagnoses:  1. Posterior tibial tendon dysfunction (PTTD) of left lower extremity     Plan: Discussed the importance of elevation compression minimizing her activities.  We will start her on Neurontin 300 mg nightly and increase to 3 times daily as needed.  She may return to seated light duty work with her foot elevated on the desk but again discussed the importance of scar massage and minimizing her activities to promote healing.  Follow-Up Instructions: Return in about 3 weeks (around 10/25/2017).   Ortho Exam  Patient is alert, oriented, no adenopathy, well-dressed, normal affect, normal respiratory effort. Examination patient has no redness no cellulitis she has some mild swelling she does have hypersensitivity to palpation over the dorsal scar.  There are no skin color or temperature changes she has some mild dystrophic plantigrade she has no pain with range of motion of the ankle calf is soft no evidence of DVT.  Imaging: Xr Foot Complete Left  Result Date: 10/04/2017 3 view radiographs of the left foot shows a stable subtalar and talonavicular fusion.  There is no hardware failure or loosening around the hardware.  There seems to be some delayed fusion at the talonavicular joint.  No images  are attached to the encounter.  Labs: Lab Results  Component Value Date   HGBA1C 5.5 06/23/2016     Lab Results  Component Value Date   ALBUMIN 3.9 07/16/2017   ALBUMIN 4.3 06/23/2016   ALBUMIN 4.3 06/06/2015    Body mass index is 33.09 kg/m.  Orders:  Orders Placed This Encounter  Procedures  . XR Foot Complete Left   Meds ordered this encounter  Medications  . gabapentin (NEURONTIN) 300 MG capsule    Sig: Take 1 capsule (300 mg total) by mouth 3 (three) times daily. 3 times a day when necessary neuropathy pain    Dispense:  90 capsule    Refill:  3     Procedures: No procedures performed  Clinical Data: No additional findings.  ROS:  All other systems negative, except as noted in the HPI. Review of Systems  Objective: Vital Signs: Ht 5\' 6"  (1.676 m)   Wt 205 lb (93 kg)   LMP 08/23/2012   BMI 33.09 kg/m   Specialty Comments:  No specialty comments available.  PMFS History: Patient Active Problem List   Diagnosis Date Noted  . Posterior tibial tendon dysfunction (PTTD) of left lower extremity   . Posterior tibial tendinitis, left leg 07/05/2017  . Left foot pain 06/18/2017  . Chest pain in adult 05/20/2017  . Obesity 06/08/2016  . Routine  adult health maintenance 06/06/2015  . Multiple falls 06/06/2015  . Contusion of left leg 06/06/2015  . Faintness 06/06/2015  . Cough 08/24/2014  . DDD (degenerative disc disease), lumbar 05/21/2014  . Essential hypertension 05/21/2014  . Right leg pain 04/02/2014  . Generalized headaches 04/02/2014  . Abscess of axilla, left 11/21/2012   Past Medical History:  Diagnosis Date  . DDD (degenerative disc disease), lumbar   . Gall stones   . Hypertension     Family History  Problem Relation Age of Onset  . Pancreatic cancer Mother        Deceased  . Hypertension Father   . Deep vein thrombosis Brother        Deceased  . Healthy Sister   . Healthy Son   . Healthy Daughter     Past Surgical History:    Procedure Laterality Date  . ANKLE FUSION Left 07/21/2017   Procedure: SUBTALAR AND TALONAVICULAR FUSION LEFT FOOT;  Surgeon: Newt Minion, MD;  Location: [facility];  Service: Orthopedics;  Laterality: Left;  . AXILLARY LYMPH NODE DISSECTION Left 11/24/2012   Procedure: incision and drainage axillary abscess;  Surgeon: Adin Hector, MD;  Location: [facility];  Service: General;  Laterality: Left;  . BREAST SURGERY  2012   breast biopsy  . CHOLECYSTECTOMY    . TUBAL LIGATION     Social History   Occupational History  . Occupation: Scientist, research (medical)  Tobacco Use  . Smoking status: Never Smoker  . Smokeless tobacco: Never Used  Substance and Sexual Activity  . Alcohol use: No    Alcohol/week: 0.0 oz  . Drug use: No  . Sexual activity: Not on file

## 2017-10-04 NOTE — Telephone Encounter (Signed)
Pt spouse and pt showed up today for appt, however appt is scheduled for tomorrow. Pt is out of BP medication. Will send in a short supply.

## 2017-10-05 ENCOUNTER — Other Ambulatory Visit (INDEPENDENT_AMBULATORY_CARE_PROVIDER_SITE_OTHER): Payer: BLUE CROSS/BLUE SHIELD

## 2017-10-05 ENCOUNTER — Encounter: Payer: Self-pay | Admitting: Internal Medicine

## 2017-10-05 ENCOUNTER — Ambulatory Visit: Payer: BLUE CROSS/BLUE SHIELD | Admitting: Internal Medicine

## 2017-10-05 VITALS — BP 110/70 | HR 63 | Temp 98.4°F | Ht 66.0 in | Wt 203.0 lb

## 2017-10-05 DIAGNOSIS — Z0000 Encounter for general adult medical examination without abnormal findings: Secondary | ICD-10-CM

## 2017-10-05 DIAGNOSIS — I10 Essential (primary) hypertension: Secondary | ICD-10-CM

## 2017-10-05 DIAGNOSIS — R739 Hyperglycemia, unspecified: Secondary | ICD-10-CM | POA: Diagnosis not present

## 2017-10-05 DIAGNOSIS — E559 Vitamin D deficiency, unspecified: Secondary | ICD-10-CM | POA: Diagnosis not present

## 2017-10-05 DIAGNOSIS — Z1231 Encounter for screening mammogram for malignant neoplasm of breast: Secondary | ICD-10-CM

## 2017-10-05 DIAGNOSIS — Z1212 Encounter for screening for malignant neoplasm of rectum: Secondary | ICD-10-CM

## 2017-10-05 DIAGNOSIS — Z1211 Encounter for screening for malignant neoplasm of colon: Secondary | ICD-10-CM

## 2017-10-05 LAB — CBC WITH DIFFERENTIAL/PLATELET
BASOS ABS: 0 10*3/uL (ref 0.0–0.1)
Basophils Relative: 0.8 % (ref 0.0–3.0)
EOS ABS: 0.1 10*3/uL (ref 0.0–0.7)
Eosinophils Relative: 1.2 % (ref 0.0–5.0)
HEMATOCRIT: 39.6 % (ref 36.0–46.0)
Hemoglobin: 13.3 g/dL (ref 12.0–15.0)
LYMPHS PCT: 41.9 % (ref 12.0–46.0)
Lymphs Abs: 2.4 10*3/uL (ref 0.7–4.0)
MCHC: 33.7 g/dL (ref 30.0–36.0)
MCV: 100.4 fl — ABNORMAL HIGH (ref 78.0–100.0)
Monocytes Absolute: 0.3 10*3/uL (ref 0.1–1.0)
Monocytes Relative: 5.8 % (ref 3.0–12.0)
Neutro Abs: 2.9 10*3/uL (ref 1.4–7.7)
Neutrophils Relative %: 50.3 % (ref 43.0–77.0)
Platelets: 238 10*3/uL (ref 150.0–400.0)
RBC: 3.94 Mil/uL (ref 3.87–5.11)
RDW: 13.3 % (ref 11.5–15.5)
WBC: 5.7 10*3/uL (ref 4.0–10.5)

## 2017-10-05 LAB — LIPID PANEL
CHOL/HDL RATIO: 3
Cholesterol: 144 mg/dL (ref 0–200)
HDL: 53.7 mg/dL (ref >39.00)
LDL Cholesterol: 66 mg/dL (ref 0–99)
NonHDL: 90.52
TRIGLYCERIDES: 123 mg/dL (ref 0.0–149.0)
VLDL: 24.6 mg/dL (ref 0.0–40.0)

## 2017-10-05 LAB — COMPREHENSIVE METABOLIC PANEL
ALK PHOS: 112 U/L (ref 39–117)
ALT: 14 U/L (ref 0–35)
AST: 11 U/L (ref 0–37)
Albumin: 3.7 g/dL (ref 3.5–5.2)
BILIRUBIN TOTAL: 0.3 mg/dL (ref 0.2–1.2)
BUN: 10 mg/dL (ref 6–23)
CO2: 28 mEq/L (ref 19–32)
CREATININE: 0.89 mg/dL (ref 0.40–1.20)
Calcium: 9.1 mg/dL (ref 8.4–10.5)
Chloride: 107 mEq/L (ref 96–112)
GFR: 84.02 mL/min (ref >60.00)
GLUCOSE: 90 mg/dL (ref 70–99)
Potassium: 4 mEq/L (ref 3.5–5.1)
Sodium: 140 mEq/L (ref 135–145)
TOTAL PROTEIN: 7.1 g/dL (ref 6.0–8.3)

## 2017-10-05 LAB — VITAMIN D 25 HYDROXY (VIT D DEFICIENCY, FRACTURES): VITD: 7.43 ng/mL — ABNORMAL LOW (ref 30.00–100.00)

## 2017-10-05 LAB — HEMOGLOBIN A1C: HEMOGLOBIN A1C: 5.6 % (ref 4.6–6.5)

## 2017-10-05 MED ORDER — CHOLECALCIFEROL 1.25 MG (50000 UT) PO CAPS: 50000.0000 [IU] | ORAL_CAPSULE | ORAL | 1 refills | Status: DC

## 2017-10-05 NOTE — Progress Notes (Signed)
Subjective:  Patient ID: Jamie Jenkins, female    DOB: [birth year]  Age: 57 y.o. MRN: [id number]  CC: Hypertension and Annual Exam  NEW TO ME  HPI Jamie Jenkins presents for a CPX.  She tells me that her BP has been well controlled. She feels well and offers no complaints.  Outpatient Medications Prior to Visit  Medication Sig Dispense Refill  . gabapentin (NEURONTIN) 300 MG capsule Take 1 capsule (300 mg total) by mouth 3 (three) times daily. 3 times a day when necessary neuropathy pain 90 capsule 3  . hydrochlorothiazide (HYDRODIURIL) 25 MG tablet Take 1 tablet (25 mg total) by mouth daily. 30 tablet 0  . oxyCODONE-acetaminophen (PERCOCET/ROXICET) 5-325 MG tablet Take 1 tablet by mouth every 8 (eight) hours as needed. 20 tablet 0  . ibuprofen (ADVIL,MOTRIN) 200 MG tablet Take 400 mg by mouth daily as needed for headache or moderate pain.    Marland Kitchen lisinopril (PRINIVIL,ZESTRIL) 20 MG tablet Take 1 tablet (20 mg total) by mouth daily. 30 tablet 0  . meloxicam (MOBIC) 15 MG tablet Take 1 tablet (15 mg total) by mouth daily. 30 tablet 1   No facility-administered medications prior to visit.     ROS Review of Systems  Constitutional: Negative for diaphoresis, fatigue and unexpected weight change.  HENT: Negative.   Eyes: Negative for pain and visual disturbance.  Respiratory: Negative for cough, chest tightness, shortness of breath and wheezing.   Cardiovascular: Negative for chest pain, palpitations and leg swelling.  Gastrointestinal: Negative for abdominal pain, constipation, diarrhea, nausea and vomiting.  Endocrine: Negative.   Genitourinary: Negative.  Negative for difficulty urinating.  Musculoskeletal: Negative.  Negative for back pain and myalgias.  Skin: Negative.  Negative for color change and rash.  Neurological: Negative.  Negative for dizziness, weakness, light-headedness and headaches.  Hematological: Negative for adenopathy. Does not bruise/bleed easily.    Psychiatric/Behavioral: Negative.     Objective:  BP 110/70 (BP Location: Left Arm, Patient Position: Sitting, Cuff Size: Large)   Pulse 63   Temp 98.4 F (36.9 C) (Oral)   Ht 5\' 6"  (1.676 m)   Wt 203 lb (92.1 kg)   LMP 08/23/2012   SpO2 95%   BMI 32.77 kg/m   BP Readings from Last 3 Encounters:  10/05/17 110/70  07/21/17 103/65  07/16/17 (!) 156/89    Wt Readings from Last 3 Encounters:  10/05/17 203 lb (92.1 kg)  10/04/17 205 lb (93 kg)  09/02/17 205 lb (93 kg)    Physical Exam  Constitutional: She is oriented to person, place, and time. No distress.  HENT:  Mouth/Throat: Oropharynx is clear and moist. No oropharyngeal exudate.  Eyes: Conjunctivae are normal. No scleral icterus.  Neck: Normal range of motion. Neck supple. No JVD present. No thyromegaly present.  Cardiovascular: Normal rate, regular rhythm and normal heart sounds. Exam reveals no gallop.  No murmur heard. Pulmonary/Chest: Effort normal and breath sounds normal. She has no wheezes. She has no rhonchi. She has no rales.  Abdominal: Soft. Normal appearance and bowel sounds are normal. She exhibits no mass. There is no hepatosplenomegaly. There is no tenderness.  Musculoskeletal: Normal range of motion. She exhibits no edema, tenderness or deformity.  Lymphadenopathy:    She has no cervical adenopathy.  Neurological: She is alert and oriented to person, place, and time.  Skin: Skin is warm and dry. No rash noted. She is not diaphoretic. No pallor.  Vitals reviewed.   Lab Results  Component Value  Date   WBC 5.7 10/05/2017   HGB 13.3 10/05/2017   HCT 39.6 10/05/2017   PLT 238.0 10/05/2017   GLUCOSE 90 10/05/2017   CHOL 144 10/05/2017   TRIG 123.0 10/05/2017   HDL 53.70 10/05/2017   LDLCALC 66 10/05/2017   ALT 14 10/05/2017   AST 11 10/05/2017   NA 140 10/05/2017   K 4.0 10/05/2017   CL 107 10/05/2017   CREATININE 0.89 10/05/2017   BUN 10 10/05/2017   CO2 28 10/05/2017   TSH 1.35 06/06/2015    INR 1.01 07/16/2017   HGBA1C 5.6 10/05/2017    No results found.  Assessment & Plan:   Jamie Jenkins was seen today for hypertension and annual exam.  Diagnoses and all orders for this visit:  Colon cancer screening -     Cologuard  Screening for malignant neoplasm of the rectum -     Cologuard -     Cancel: MM DIGITAL SCREENING BILATERAL; Future  Routine adult health maintenance- Exam completed, labs reviewed, vaccines reviewed, PAP is UTD, Cologuard ordered to screen for polyps/cancer, mammogram ordered -     Lipid panel; Future -     HIV antibody; Future  Essential hypertension- Her BP is over controlled, will discontinue the ACEI, will cont HCTZ, will treat the Vit D defic.  -     CBC with Differential/Platelet; Future -     VITAMIN D 25 Hydroxy (Vit-D Deficiency, Fractures); Future -     Comprehensive metabolic panel; Future  Hyperglycemia -     Hemoglobin A1c; Future -     Comprehensive metabolic panel; Future  Vitamin D deficiency disease -     Cholecalciferol 50000 units capsule; Take 1 capsule (50,000 Units total) by mouth once a week.  Visit for screening mammogram -     MM DIGITAL SCREENING BILATERAL; Future   I have discontinued Jamie Jenkins's meloxicam, ibuprofen, and lisinopril. I am also having her start on Cholecalciferol. Additionally, I am having her maintain her hydrochlorothiazide, oxyCODONE-acetaminophen, and gabapentin.  Meds ordered this encounter  Medications  . Cholecalciferol 50000 units capsule    Sig: Take 1 capsule (50,000 Units total) by mouth once a week.    Dispense:  12 capsule    Refill:  1     Follow-up: Return in about 6 months (around 04/07/2018).  Jamie Calico, MD

## 2017-10-05 NOTE — Patient Instructions (Signed)

## 2017-10-06 LAB — HIV ANTIBODY (ROUTINE TESTING W REFLEX): HIV 1&2 Ab, 4th Generation: NONREACTIVE

## 2017-10-07 ENCOUNTER — Encounter: Payer: Self-pay | Admitting: Internal Medicine

## 2017-10-13 LAB — COLOGUARD: COLOGUARD: NEGATIVE

## 2017-10-22 ENCOUNTER — Telehealth (INDEPENDENT_AMBULATORY_CARE_PROVIDER_SITE_OTHER): Payer: Self-pay | Admitting: Orthopedic Surgery

## 2017-10-22 NOTE — Telephone Encounter (Signed)
Pt is being treated for PTTI and requesting refill on percocet. Last refill was 09/27/17 #20

## 2017-10-22 NOTE — Telephone Encounter (Signed)
Patient called requesting an RX refill on her Oxycodone.  CB#[phone].  Thank you.

## 2017-10-25 ENCOUNTER — Other Ambulatory Visit (INDEPENDENT_AMBULATORY_CARE_PROVIDER_SITE_OTHER): Payer: Self-pay | Admitting: Orthopedic Surgery

## 2017-10-25 MED ORDER — OXYCODONE-ACETAMINOPHEN 5-325 MG PO TABS: 1.0000 | ORAL_TABLET | Freq: Three times a day (TID) | ORAL | 0 refills | Status: DC | PRN

## 2017-10-25 NOTE — Telephone Encounter (Signed)
rx written

## 2017-10-25 NOTE — Telephone Encounter (Signed)
Called and lm on vm to advise that rx is at the front desk for pick up.  

## 2017-10-27 ENCOUNTER — Ambulatory Visit (INDEPENDENT_AMBULATORY_CARE_PROVIDER_SITE_OTHER): Payer: BLUE CROSS/BLUE SHIELD | Admitting: Orthopedic Surgery

## 2017-10-27 ENCOUNTER — Ambulatory Visit (INDEPENDENT_AMBULATORY_CARE_PROVIDER_SITE_OTHER): Payer: BLUE CROSS/BLUE SHIELD | Admitting: Family

## 2017-10-27 ENCOUNTER — Encounter (INDEPENDENT_AMBULATORY_CARE_PROVIDER_SITE_OTHER): Payer: Self-pay | Admitting: Family

## 2017-10-27 VITALS — Ht 66.0 in | Wt 203.0 lb

## 2017-10-27 DIAGNOSIS — M25572 Pain in left ankle and joints of left foot: Secondary | ICD-10-CM

## 2017-10-27 NOTE — Progress Notes (Signed)
Office Visit Note   Patient: Jamie Jenkins           Date of Birth: 11-10-1960           MRN: [id number] Visit Date: 10/27/2017              Requested by: Biagio Borg, MD [place] [place], [place] [address] PCP: Janith Lima, MD  Chief Complaint  Patient presents with  . Left Foot - Routine Post Op    07/21/17 subtalar and talonavicular fusion       HPI: Patient is a 57 year old woman who is status post talonavicular and subtalar fusion on 07/21/17.  The patient has returned to work she is working 4-hour days.  Continues to have pain and swelling to her lateral ankle.  In a postop shoe.  States this is the only comfortable shoewear.  She has tried wearing sandals but could not tolerate.  Is wearing a medical compression stocking today.  Patient states she has increased pain at the end of the day from increased activities.  Assessment & Plan: Visit Diagnoses:  No diagnosis found.  Plan: The importance of elevation and compression were discussed.  Have advised her to stop her Neurontin as it has not been helpful.  She may continue with her seated light duty work will increase this to 6 hours a day.  Continue with scar massage.  Anti-inflammatories for pain.  Follow-Up Instructions: No follow-ups on file.   Left Ankle Exam   Tenderness  Left ankle tenderness location: generalized lateral and anterior joint line.  Swelling: mild  Other  Erythema: absent Scars: present Sensation: normal Pulse: present      Patient is alert, oriented, no adenopathy, well-dressed, normal affect, normal respiratory effort. Examination patient has no redness no cellulitis she has some mild swelling she does have hypersensitivity to palpation over the dorsal scar.  Poorly localized tenderness to anterior lateral ankle.  No pain with resisted eversion inversion. there are no skin color or temperature changes she has some mild dystrophic plantigrade she has no pain with range of motion  of the ankle calf is soft no evidence of DVT.  Imaging: No results found. No images are attached to the encounter.  Labs: Lab Results  Component Value Date   HGBA1C 5.6 10/05/2017   HGBA1C 5.5 06/23/2016     Lab Results  Component Value Date   ALBUMIN 3.7 10/05/2017   ALBUMIN 3.9 07/16/2017   ALBUMIN 4.3 06/23/2016    Body mass index is 32.77 kg/m.  Orders:  No orders of the defined types were placed in this encounter.  No orders of the defined types were placed in this encounter.    Procedures: No procedures performed  Clinical Data: No additional findings.  ROS:  All other systems negative, except as noted in the HPI. Review of Systems  Constitutional: Negative for chills and fever.  Musculoskeletal: Positive for arthralgias, joint swelling and myalgias.  Skin: Negative for color change.    Objective: Vital Signs: Ht 5\' 6"  (1.676 m)   Wt 203 lb (92.1 kg)   LMP 08/23/2012   BMI 32.77 kg/m   Specialty Comments:  No specialty comments available.  PMFS History: Patient Active Problem List   Diagnosis Date Noted  . Hyperglycemia 10/05/2017  . Vitamin D deficiency disease 10/05/2017  . Obesity 06/08/2016  . Screening for malignant neoplasm of the rectum 06/06/2015  . DDD (degenerative disc disease), lumbar 05/21/2014  . Essential  hypertension 05/21/2014   Past Medical History:  Diagnosis Date  . DDD (degenerative disc disease), lumbar   . Gall stones   . Hypertension     Family History  Problem Relation Age of Onset  . Pancreatic cancer Mother        Deceased  . Hypertension Father   . Deep vein thrombosis Brother        Deceased  . Healthy Sister   . Healthy Son   . Healthy Daughter     Past Surgical History:  Procedure Laterality Date  . ANKLE FUSION Left 07/21/2017   Procedure: SUBTALAR AND TALONAVICULAR FUSION LEFT FOOT;  Surgeon: Newt Minion, MD;  Location: [facility];  Service: Orthopedics;  Laterality: Left;  . AXILLARY LYMPH NODE  DISSECTION Left 11/24/2012   Procedure: incision and drainage axillary abscess;  Surgeon: Adin Hector, MD;  Location: [facility];  Service: General;  Laterality: Left;  . BREAST SURGERY  2012   breast biopsy  . CHOLECYSTECTOMY    . TUBAL LIGATION     Social History   Occupational History  . Occupation: Scientist, research (medical)  Tobacco Use  . Smoking status: Never Smoker  . Smokeless tobacco: Never Used  Substance and Sexual Activity  . Alcohol use: No    Alcohol/week: 0.0 standard drinks  . Drug use: No  . Sexual activity: Not on file

## 2017-11-11 ENCOUNTER — Other Ambulatory Visit: Payer: Self-pay | Admitting: Internal Medicine

## 2017-11-12 MED ORDER — HYDROCHLOROTHIAZIDE 25 MG PO TABS: 25.0000 mg | ORAL_TABLET | Freq: Every day | ORAL | 1 refills | Status: DC

## 2017-11-12 NOTE — Telephone Encounter (Signed)
Pt called and wanted to know the reason the lisinopril was discontinued. Pt informed that [facility] PCP dc'ed the lisinopril and continued the HCTZ.   Pt stated understanding and is requesting rf of the HCTZ

## 2017-11-12 NOTE — Addendum Note (Signed)
Addended by: Aviva Signs M on: 11/12/2017 10:58 AM   Modules accepted: Orders

## 2017-11-22 DIAGNOSIS — Z1212 Encounter for screening for malignant neoplasm of rectum: Secondary | ICD-10-CM | POA: Diagnosis not present

## 2017-11-22 DIAGNOSIS — Z1211 Encounter for screening for malignant neoplasm of colon: Secondary | ICD-10-CM | POA: Diagnosis not present

## 2017-12-03 ENCOUNTER — Ambulatory Visit
Admission: RE | Admit: 2017-12-03 | Discharge: 2017-12-03 | Disposition: A | Discharge status: Home / Self Care | Payer: BLUE CROSS/BLUE SHIELD | Source: Ambulatory Visit | Attending: Internal Medicine | Admitting: Internal Medicine

## 2017-12-03 DIAGNOSIS — Z1231 Encounter for screening mammogram for malignant neoplasm of breast: Secondary | ICD-10-CM | POA: Diagnosis not present

## 2017-12-06 LAB — HM MAMMOGRAPHY

## 2017-12-08 ENCOUNTER — Ambulatory Visit (INDEPENDENT_AMBULATORY_CARE_PROVIDER_SITE_OTHER): Payer: BLUE CROSS/BLUE SHIELD | Admitting: Family

## 2017-12-08 ENCOUNTER — Ambulatory Visit (INDEPENDENT_AMBULATORY_CARE_PROVIDER_SITE_OTHER): Payer: Self-pay

## 2017-12-08 ENCOUNTER — Encounter (INDEPENDENT_AMBULATORY_CARE_PROVIDER_SITE_OTHER): Payer: Self-pay | Admitting: Family

## 2017-12-08 VITALS — Ht 66.0 in | Wt 203.0 lb

## 2017-12-08 DIAGNOSIS — M25572 Pain in left ankle and joints of left foot: Secondary | ICD-10-CM

## 2017-12-08 MED ORDER — OXYCODONE-ACETAMINOPHEN 5-325 MG PO TABS: 1.0000 | ORAL_TABLET | Freq: Three times a day (TID) | ORAL | 0 refills | Status: DC | PRN

## 2017-12-10 NOTE — Progress Notes (Signed)
Office Visit Note   Patient: Jamie Jenkins           Date of Birth: 1960/04/15           MRN: [id number] Visit Date: 12/08/2017              Requested by: Janith Lima, MD [address] [place], [place] [address] PCP: Janith Lima, MD  Chief Complaint  Patient presents with  . Left Foot - Routine Post Op, Pain      HPI: Patient is a 57 year old woman who is status post talonavicular and subtalar fusion on 07/21/17.  The patient has returned to work she is working 6-hour days of seated light duty work.  Continues to have pain and swelling to her lateral ankle and foot.  She points to the anterior joint line of her ankle as well as the incision line laterally.    Is ambulating in a postop shoe.  States this is the only comfortable shoewear.  She has been unable to tolerate regular shoewear or barefoot walking even from her bed to her bathroom.    Is wearing a medical compression stockings daily.   Assessment & Plan: Visit Diagnoses:  1. Pain in left ankle and joints of left foot     Plan: The importance of elevation and compression were discussed. She may continue with her seated light duty work will increase this to 6 hours a day.  Continue with scar massage.  Anti-inflammatories for pain.  Did refill her pain medication today.  Discussed the slow fusing of her foot.  Have provided her with a cam walker she ambulates in this pain-free recommended she wear this with all ambulation.  Minimize weightbearing.  Follow-Up Instructions: No follow-ups on file.   Left Ankle Exam   Tenderness  Left ankle tenderness location: generalized lateral and anterior joint line.  Swelling: mild  Other  Erythema: absent Scars: present Sensation: normal Pulse: present      Patient is alert, oriented, no adenopathy, well-dressed, normal affect, normal respiratory effort. Examination patient has no redness no cellulitis she has some mild swelling.  She is tender to the  anterior joint line and lateral incision.    Imaging: No results found. No images are attached to the encounter.  Labs: Lab Results  Component Value Date   HGBA1C 5.6 10/05/2017   HGBA1C 5.5 06/23/2016     Lab Results  Component Value Date   ALBUMIN 3.7 10/05/2017   ALBUMIN 3.9 07/16/2017   ALBUMIN 4.3 06/23/2016    Body mass index is 32.77 kg/m.  Orders:  Orders Placed This Encounter  Procedures  . XR Foot Complete Left   Meds ordered this encounter  Medications  . oxyCODONE-acetaminophen (PERCOCET/ROXICET) 5-325 MG tablet    Sig: Take 1 tablet by mouth every 8 (eight) hours as needed.    Dispense:  20 tablet    Refill:  0     Procedures: No procedures performed  Clinical Data: No additional findings.  ROS:  All other systems negative, except as noted in the HPI. Review of Systems  Constitutional: Negative for chills and fever.  Musculoskeletal: Positive for arthralgias, joint swelling and myalgias.  Skin: Negative for color change.    Objective: Vital Signs: Ht 5\' 6"  (1.676 m)   Wt 203 lb (92.1 kg)   LMP 08/23/2012   BMI 32.77 kg/m   Specialty Comments:  No specialty comments available.  PMFS History: Patient Active Problem List  Diagnosis Date Noted  . Hyperglycemia 10/05/2017  . Vitamin D deficiency disease 10/05/2017  . Obesity 06/08/2016  . Screening for malignant neoplasm of the rectum 06/06/2015  . DDD (degenerative disc disease), lumbar 05/21/2014  . Essential hypertension 05/21/2014   Past Medical History:  Diagnosis Date  . DDD (degenerative disc disease), lumbar   . Gall stones   . Hypertension     Family History  Problem Relation Age of Onset  . Pancreatic cancer Mother        Deceased  . Hypertension Father   . Deep vein thrombosis Brother        Deceased  . Healthy Sister   . Healthy Son   . Healthy Daughter     Past Surgical History:  Procedure Laterality Date  . ANKLE FUSION Left 07/21/2017   Procedure:  SUBTALAR AND TALONAVICULAR FUSION LEFT FOOT;  Surgeon: Newt Minion, MD;  Location: [facility];  Service: Orthopedics;  Laterality: Left;  . AXILLARY LYMPH NODE DISSECTION Left 11/24/2012   Procedure: incision and drainage axillary abscess;  Surgeon: Adin Hector, MD;  Location: [facility];  Service: General;  Laterality: Left;  . BREAST SURGERY  2012   breast biopsy  . CHOLECYSTECTOMY    . TUBAL LIGATION     Social History   Occupational History  . Occupation: Scientist, research (medical)  Tobacco Use  . Smoking status: Never Smoker  . Smokeless tobacco: Never Used  Substance and Sexual Activity  . Alcohol use: No    Alcohol/week: 0.0 standard drinks  . Drug use: No  . Sexual activity: Not on file

## 2017-12-13 ENCOUNTER — Encounter: Payer: Self-pay | Admitting: Internal Medicine

## 2018-01-05 ENCOUNTER — Encounter (INDEPENDENT_AMBULATORY_CARE_PROVIDER_SITE_OTHER): Payer: Self-pay | Admitting: Family

## 2018-01-05 ENCOUNTER — Ambulatory Visit (INDEPENDENT_AMBULATORY_CARE_PROVIDER_SITE_OTHER): Payer: BLUE CROSS/BLUE SHIELD | Admitting: Orthopedic Surgery

## 2018-01-05 VITALS — Ht 66.0 in | Wt 203.0 lb

## 2018-01-05 DIAGNOSIS — M76822 Posterior tibial tendinitis, left leg: Secondary | ICD-10-CM | POA: Diagnosis not present

## 2018-01-05 NOTE — Progress Notes (Signed)
Office Visit Note   Patient: Jamie Jenkins           Date of Birth: 21-Sep-1960           MRN: [id number] Visit Date: 01/05/2018              Requested by: Janith Lima, MD [address] [place], [place] [address] PCP: Janith Lima, MD  Chief Complaint  Patient presents with  . Left Foot - Follow-up    Pain in left ankle and foot      HPI: Patient is a 57 year old woman who presents 5 months status post talonavicular and subtalar fusion for posterior tibial tendon insufficiency.  Patient states she has pain over the surgical incisions and has pain with weightbearing without the fracture boot.  She states she is not taking any narcotic pain medicine.  Assessment & Plan: Visit Diagnoses:  1. Posterior tibial tendon dysfunction (PTTD) of left lower extremity     Plan: We will set the patient up with physical therapy for range of motion strengthening and proprioception of the left foot and ankle as well as deep tissue scar massage.  The importance of daily scar massage was discussed and demonstrated.  3 view radiographs of the left foot at follow-up.  Follow-Up Instructions: Return in about 4 weeks (around 02/02/2018).   Ortho Exam  Patient is alert, oriented, no adenopathy, well-dressed, normal affect, normal respiratory effort. Examination patient has good pulses.  She has no pain with range of motion of the ankle.  There is no subtalar motion and she has no pain with attempted subtalar motion.  She has no tenderness to palpation across the talonavicular joint.  The surgical incisions have a keloid scar there is no redness no cellulitis no drainage these are very tender to palpation.  Imaging: No results found. No images are attached to the encounter.  Labs: Lab Results  Component Value Date   HGBA1C 5.6 10/05/2017   HGBA1C 5.5 06/23/2016     Lab Results  Component Value Date   ALBUMIN 3.7 10/05/2017   ALBUMIN 3.9 07/16/2017   ALBUMIN 4.3  06/23/2016    Body mass index is 32.77 kg/m.  Orders:  No orders of the defined types were placed in this encounter.  No orders of the defined types were placed in this encounter.    Procedures: No procedures performed  Clinical Data: No additional findings.  ROS:  All other systems negative, except as noted in the HPI. Review of Systems  Objective: Vital Signs: Ht 5\' 6"  (1.676 m)   Wt 203 lb (92.1 kg)   LMP 08/23/2012   BMI 32.77 kg/m   Specialty Comments:  No specialty comments available.  PMFS History: Patient Active Problem List   Diagnosis Date Noted  . Hyperglycemia 10/05/2017  . Vitamin D deficiency disease 10/05/2017  . Obesity 06/08/2016  . Screening for malignant neoplasm of the rectum 06/06/2015  . DDD (degenerative disc disease), lumbar 05/21/2014  . Essential hypertension 05/21/2014   Past Medical History:  Diagnosis Date  . DDD (degenerative disc disease), lumbar   . Gall stones   . Hypertension     Family History  Problem Relation Age of Onset  . Pancreatic cancer Mother        Deceased  . Hypertension Father   . Deep vein thrombosis Brother        Deceased  . Healthy Sister   . Healthy Son   . Healthy Daughter  Past Surgical History:  Procedure Laterality Date  . ANKLE FUSION Left 07/21/2017   Procedure: SUBTALAR AND TALONAVICULAR FUSION LEFT FOOT;  Surgeon: Newt Minion, MD;  Location: [facility];  Service: Orthopedics;  Laterality: Left;  . AXILLARY LYMPH NODE DISSECTION Left 11/24/2012   Procedure: incision and drainage axillary abscess;  Surgeon: Adin Hector, MD;  Location: [facility];  Service: General;  Laterality: Left;  . BREAST SURGERY  2012   breast biopsy  . CHOLECYSTECTOMY    . TUBAL LIGATION     Social History   Occupational History  . Occupation: Scientist, research (medical)  Tobacco Use  . Smoking status: Never Smoker  . Smokeless tobacco: Never Used  Substance and Sexual Activity  . Alcohol use: No    Alcohol/week: 0.0  standard drinks  . Drug use: No  . Sexual activity: Not on file

## 2018-01-14 ENCOUNTER — Telehealth (INDEPENDENT_AMBULATORY_CARE_PROVIDER_SITE_OTHER): Payer: Self-pay | Admitting: Orthopedic Surgery

## 2018-01-14 NOTE — Telephone Encounter (Signed)
Called pt and advised at last visit Dr. Sharol Given had wanted her to work on physical therapy and movement of the ankle. She has a follow up appt with Dr. Sharol Given on 02/02/18 to discuss at next visit if she does not feel that she has received any benefit by then.

## 2018-01-14 NOTE — Telephone Encounter (Signed)
Patient called about getting handicap sticker. States foot is not getting any better.  Please cal patient @ [phone]

## 2018-01-24 DIAGNOSIS — M25672 Stiffness of left ankle, not elsewhere classified: Secondary | ICD-10-CM | POA: Diagnosis not present

## 2018-01-24 DIAGNOSIS — M25472 Effusion, left ankle: Secondary | ICD-10-CM | POA: Diagnosis not present

## 2018-01-24 DIAGNOSIS — M25572 Pain in left ankle and joints of left foot: Secondary | ICD-10-CM | POA: Diagnosis not present

## 2018-01-24 DIAGNOSIS — R531 Weakness: Secondary | ICD-10-CM | POA: Diagnosis not present

## 2018-01-26 DIAGNOSIS — M25572 Pain in left ankle and joints of left foot: Secondary | ICD-10-CM | POA: Diagnosis not present

## 2018-01-26 DIAGNOSIS — M25472 Effusion, left ankle: Secondary | ICD-10-CM | POA: Diagnosis not present

## 2018-01-26 DIAGNOSIS — R531 Weakness: Secondary | ICD-10-CM | POA: Diagnosis not present

## 2018-01-26 DIAGNOSIS — M25672 Stiffness of left ankle, not elsewhere classified: Secondary | ICD-10-CM | POA: Diagnosis not present

## 2018-01-28 DIAGNOSIS — R531 Weakness: Secondary | ICD-10-CM | POA: Diagnosis not present

## 2018-01-28 DIAGNOSIS — M25572 Pain in left ankle and joints of left foot: Secondary | ICD-10-CM | POA: Diagnosis not present

## 2018-01-28 DIAGNOSIS — M25472 Effusion, left ankle: Secondary | ICD-10-CM | POA: Diagnosis not present

## 2018-01-28 DIAGNOSIS — M25672 Stiffness of left ankle, not elsewhere classified: Secondary | ICD-10-CM | POA: Diagnosis not present

## 2018-01-31 DIAGNOSIS — M25572 Pain in left ankle and joints of left foot: Secondary | ICD-10-CM | POA: Diagnosis not present

## 2018-01-31 DIAGNOSIS — M25472 Effusion, left ankle: Secondary | ICD-10-CM | POA: Diagnosis not present

## 2018-01-31 DIAGNOSIS — M25672 Stiffness of left ankle, not elsewhere classified: Secondary | ICD-10-CM | POA: Diagnosis not present

## 2018-01-31 DIAGNOSIS — R531 Weakness: Secondary | ICD-10-CM | POA: Diagnosis not present

## 2018-02-02 ENCOUNTER — Ambulatory Visit (INDEPENDENT_AMBULATORY_CARE_PROVIDER_SITE_OTHER): Payer: BLUE CROSS/BLUE SHIELD | Admitting: Orthopedic Surgery

## 2018-02-02 DIAGNOSIS — M25672 Stiffness of left ankle, not elsewhere classified: Secondary | ICD-10-CM | POA: Diagnosis not present

## 2018-02-02 DIAGNOSIS — R531 Weakness: Secondary | ICD-10-CM | POA: Diagnosis not present

## 2018-02-02 DIAGNOSIS — M25572 Pain in left ankle and joints of left foot: Secondary | ICD-10-CM | POA: Diagnosis not present

## 2018-02-02 DIAGNOSIS — M25472 Effusion, left ankle: Secondary | ICD-10-CM | POA: Diagnosis not present

## 2018-02-03 ENCOUNTER — Ambulatory Visit (INDEPENDENT_AMBULATORY_CARE_PROVIDER_SITE_OTHER): Payer: BLUE CROSS/BLUE SHIELD | Admitting: Physician Assistant

## 2018-02-03 ENCOUNTER — Ambulatory Visit (INDEPENDENT_AMBULATORY_CARE_PROVIDER_SITE_OTHER): Payer: Self-pay

## 2018-02-03 VITALS — Ht 66.0 in | Wt 203.0 lb

## 2018-02-03 DIAGNOSIS — M76822 Posterior tibial tendinitis, left leg: Secondary | ICD-10-CM

## 2018-02-03 DIAGNOSIS — M79672 Pain in left foot: Secondary | ICD-10-CM

## 2018-02-03 NOTE — Progress Notes (Signed)
Office Visit Note   Patient: Jamie Jenkins           Date of Birth: Jan 20, 1961           MRN: [id number] Visit Date: 02/03/2018              Requested by: Janith Lima, MD [address], [place] [address] PCP: Janith Lima, MD  Chief Complaint  Patient presents with  . Left Foot - Follow-up    Subtalar and talonavicular fusion 6 months out and posterior tibial tendon dysfunction       HPI: The patient is a 57 year old woman who presents 6 months status post talonavicular and subtalar fusion for posterior tibial tendon insufficiency.  Today she reports she is continuing to have swelling and pain particularly over the lateral foot and the subtalar area as well as in the joint of the ankle.  She reports she still had some much pain that she is walking with a fracture boot.  She reports she has started physical therapy and is really working a lot on stretching her Achilles tendon and does feel like she has made some progress. She is wearing a vive compression stocking.  Assessment & Plan: Visit Diagnoses:  1. Posterior tibial tendon dysfunction (PTTD) of left lower extremity   2. Pain in left foot     Plan: We discussed transitioning into stiff soled sneakers out of her fracture boot.  We also recommend that she continued physical therapy to address her gait and mobility.  She is can follow-up after Christmas or sooner should she have difficulties in the interim.  Follow-Up Instructions: Return in about 6 weeks (around 03/17/2018).   Ortho Exam  Patient is alert, oriented, no adenopathy, well-dressed, normal affect, normal respiratory effort. The left lateral foot is tender to palpation over the subtalar joint as well as the ankle joint.  She has some mild edema about the area.  Her Achilles tendon is less tight than on previous visits.  There is no signs of cellulitis no signs of infection.  Imaging: Xr Foot Complete Left  Result Date: 02/04/2018 Bone  is well healed with internal fixation in place, good position and alignment  No images are attached to the encounter.  Labs: Lab Results  Component Value Date   HGBA1C 5.6 10/05/2017   HGBA1C 5.5 06/23/2016     Lab Results  Component Value Date   ALBUMIN 3.7 10/05/2017   ALBUMIN 3.9 07/16/2017   ALBUMIN 4.3 06/23/2016    Body mass index is 32.77 kg/m.  Orders:  Orders Placed This Encounter  Procedures  . XR Foot Complete Left   No orders of the defined types were placed in this encounter.    Procedures: No procedures performed  Clinical Data: No additional findings.  ROS:  All other systems negative, except as noted in the HPI. Review of Systems  Objective: Vital Signs: Ht 5\' 6"  (1.676 m)   Wt 203 lb (92.1 kg)   LMP 08/23/2012   BMI 32.77 kg/m   Specialty Comments:  No specialty comments available.  PMFS History: Patient Active Problem List   Diagnosis Date Noted  . Hyperglycemia 10/05/2017  . Vitamin D deficiency disease 10/05/2017  . Obesity 06/08/2016  . Screening for malignant neoplasm of the rectum 06/06/2015  . DDD (degenerative disc disease), lumbar 05/21/2014  . Essential hypertension 05/21/2014   Past Medical History:  Diagnosis Date  . DDD (degenerative disc disease), lumbar   .  Gall stones   . Hypertension     Family History  Problem Relation Age of Onset  . Pancreatic cancer Mother        Deceased  . Hypertension Father   . Deep vein thrombosis Brother        Deceased  . Healthy Sister   . Healthy Son   . Healthy Daughter     Past Surgical History:  Procedure Laterality Date  . ANKLE FUSION Left 07/21/2017   Procedure: SUBTALAR AND TALONAVICULAR FUSION LEFT FOOT;  Surgeon: Newt Minion, MD;  Location: [facility];  Service: Orthopedics;  Laterality: Left;  . AXILLARY LYMPH NODE DISSECTION Left 11/24/2012   Procedure: incision and drainage axillary abscess;  Surgeon: Adin Hector, MD;  Location: [facility];  Service: General;   Laterality: Left;  . BREAST SURGERY  2012   breast biopsy  . CHOLECYSTECTOMY    . TUBAL LIGATION     Social History   Occupational History  . Occupation: Scientist, research (medical)  Tobacco Use  . Smoking status: Never Smoker  . Smokeless tobacco: Never Used  Substance and Sexual Activity  . Alcohol use: No    Alcohol/week: 0.0 standard drinks  . Drug use: No  . Sexual activity: Not on file

## 2018-02-04 ENCOUNTER — Encounter (INDEPENDENT_AMBULATORY_CARE_PROVIDER_SITE_OTHER): Payer: Self-pay | Admitting: Physician Assistant

## 2018-02-04 DIAGNOSIS — R531 Weakness: Secondary | ICD-10-CM | POA: Diagnosis not present

## 2018-02-04 DIAGNOSIS — M25472 Effusion, left ankle: Secondary | ICD-10-CM | POA: Diagnosis not present

## 2018-02-04 DIAGNOSIS — M25572 Pain in left ankle and joints of left foot: Secondary | ICD-10-CM | POA: Diagnosis not present

## 2018-02-04 DIAGNOSIS — M25672 Stiffness of left ankle, not elsewhere classified: Secondary | ICD-10-CM | POA: Diagnosis not present

## 2018-02-07 DIAGNOSIS — R531 Weakness: Secondary | ICD-10-CM | POA: Diagnosis not present

## 2018-02-07 DIAGNOSIS — M25672 Stiffness of left ankle, not elsewhere classified: Secondary | ICD-10-CM | POA: Diagnosis not present

## 2018-02-07 DIAGNOSIS — M25472 Effusion, left ankle: Secondary | ICD-10-CM | POA: Diagnosis not present

## 2018-02-07 DIAGNOSIS — M25572 Pain in left ankle and joints of left foot: Secondary | ICD-10-CM | POA: Diagnosis not present

## 2018-02-09 DIAGNOSIS — M25472 Effusion, left ankle: Secondary | ICD-10-CM | POA: Diagnosis not present

## 2018-02-09 DIAGNOSIS — R531 Weakness: Secondary | ICD-10-CM | POA: Diagnosis not present

## 2018-02-09 DIAGNOSIS — M25572 Pain in left ankle and joints of left foot: Secondary | ICD-10-CM | POA: Diagnosis not present

## 2018-02-09 DIAGNOSIS — M25672 Stiffness of left ankle, not elsewhere classified: Secondary | ICD-10-CM | POA: Diagnosis not present

## 2018-02-11 DIAGNOSIS — M25672 Stiffness of left ankle, not elsewhere classified: Secondary | ICD-10-CM | POA: Diagnosis not present

## 2018-02-11 DIAGNOSIS — M25472 Effusion, left ankle: Secondary | ICD-10-CM | POA: Diagnosis not present

## 2018-02-11 DIAGNOSIS — M25572 Pain in left ankle and joints of left foot: Secondary | ICD-10-CM | POA: Diagnosis not present

## 2018-02-11 DIAGNOSIS — R531 Weakness: Secondary | ICD-10-CM | POA: Diagnosis not present

## 2018-02-14 DIAGNOSIS — M25572 Pain in left ankle and joints of left foot: Secondary | ICD-10-CM | POA: Diagnosis not present

## 2018-02-14 DIAGNOSIS — M25472 Effusion, left ankle: Secondary | ICD-10-CM | POA: Diagnosis not present

## 2018-02-14 DIAGNOSIS — R531 Weakness: Secondary | ICD-10-CM | POA: Diagnosis not present

## 2018-02-14 DIAGNOSIS — M25672 Stiffness of left ankle, not elsewhere classified: Secondary | ICD-10-CM | POA: Diagnosis not present

## 2018-02-16 DIAGNOSIS — M25672 Stiffness of left ankle, not elsewhere classified: Secondary | ICD-10-CM | POA: Diagnosis not present

## 2018-02-16 DIAGNOSIS — M25472 Effusion, left ankle: Secondary | ICD-10-CM | POA: Diagnosis not present

## 2018-02-16 DIAGNOSIS — R531 Weakness: Secondary | ICD-10-CM | POA: Diagnosis not present

## 2018-02-16 DIAGNOSIS — M25572 Pain in left ankle and joints of left foot: Secondary | ICD-10-CM | POA: Diagnosis not present

## 2018-02-21 DIAGNOSIS — R531 Weakness: Secondary | ICD-10-CM | POA: Diagnosis not present

## 2018-02-21 DIAGNOSIS — M25572 Pain in left ankle and joints of left foot: Secondary | ICD-10-CM | POA: Diagnosis not present

## 2018-02-21 DIAGNOSIS — M25472 Effusion, left ankle: Secondary | ICD-10-CM | POA: Diagnosis not present

## 2018-02-21 DIAGNOSIS — M25672 Stiffness of left ankle, not elsewhere classified: Secondary | ICD-10-CM | POA: Diagnosis not present

## 2018-02-23 DIAGNOSIS — R531 Weakness: Secondary | ICD-10-CM | POA: Diagnosis not present

## 2018-02-23 DIAGNOSIS — M25672 Stiffness of left ankle, not elsewhere classified: Secondary | ICD-10-CM | POA: Diagnosis not present

## 2018-02-23 DIAGNOSIS — M25472 Effusion, left ankle: Secondary | ICD-10-CM | POA: Diagnosis not present

## 2018-02-23 DIAGNOSIS — M25572 Pain in left ankle and joints of left foot: Secondary | ICD-10-CM | POA: Diagnosis not present

## 2018-02-28 DIAGNOSIS — M25572 Pain in left ankle and joints of left foot: Secondary | ICD-10-CM | POA: Diagnosis not present

## 2018-02-28 DIAGNOSIS — M25672 Stiffness of left ankle, not elsewhere classified: Secondary | ICD-10-CM | POA: Diagnosis not present

## 2018-02-28 DIAGNOSIS — R531 Weakness: Secondary | ICD-10-CM | POA: Diagnosis not present

## 2018-02-28 DIAGNOSIS — M25472 Effusion, left ankle: Secondary | ICD-10-CM | POA: Diagnosis not present

## 2018-03-02 ENCOUNTER — Telehealth (INDEPENDENT_AMBULATORY_CARE_PROVIDER_SITE_OTHER): Payer: Self-pay | Admitting: Orthopedic Surgery

## 2018-03-02 DIAGNOSIS — M25672 Stiffness of left ankle, not elsewhere classified: Secondary | ICD-10-CM | POA: Diagnosis not present

## 2018-03-02 DIAGNOSIS — M25472 Effusion, left ankle: Secondary | ICD-10-CM | POA: Diagnosis not present

## 2018-03-02 DIAGNOSIS — M25572 Pain in left ankle and joints of left foot: Secondary | ICD-10-CM | POA: Diagnosis not present

## 2018-03-02 DIAGNOSIS — R531 Weakness: Secondary | ICD-10-CM | POA: Diagnosis not present

## 2018-03-02 NOTE — Telephone Encounter (Signed)
I called and lm on vm for pt to advise that last refill was in October. If she is having pain that requires narcotic pain medication that we need to see her in the office for evaluation first. Pt has an appt on 03/14/18. If she wants to move that appt up she can call the office and we would be happy to see her in the office at any time.

## 2018-03-02 NOTE — Telephone Encounter (Signed)
Patient came into the clinic requesting an RX refill on her Oxycodone.  Patient states that if it is after 2:00 p.m., please leave her a message if she does not answer.  CB#[phone].  Thank you.

## 2018-03-07 DIAGNOSIS — M25472 Effusion, left ankle: Secondary | ICD-10-CM | POA: Diagnosis not present

## 2018-03-07 DIAGNOSIS — R531 Weakness: Secondary | ICD-10-CM | POA: Diagnosis not present

## 2018-03-07 DIAGNOSIS — M25672 Stiffness of left ankle, not elsewhere classified: Secondary | ICD-10-CM | POA: Diagnosis not present

## 2018-03-07 DIAGNOSIS — M25572 Pain in left ankle and joints of left foot: Secondary | ICD-10-CM | POA: Diagnosis not present

## 2018-03-11 DIAGNOSIS — M25672 Stiffness of left ankle, not elsewhere classified: Secondary | ICD-10-CM | POA: Diagnosis not present

## 2018-03-11 DIAGNOSIS — M25472 Effusion, left ankle: Secondary | ICD-10-CM | POA: Diagnosis not present

## 2018-03-11 DIAGNOSIS — R531 Weakness: Secondary | ICD-10-CM | POA: Diagnosis not present

## 2018-03-11 DIAGNOSIS — M25572 Pain in left ankle and joints of left foot: Secondary | ICD-10-CM | POA: Diagnosis not present

## 2018-03-14 ENCOUNTER — Ambulatory Visit (INDEPENDENT_AMBULATORY_CARE_PROVIDER_SITE_OTHER): Payer: BLUE CROSS/BLUE SHIELD | Admitting: Orthopedic Surgery

## 2018-03-14 ENCOUNTER — Encounter (INDEPENDENT_AMBULATORY_CARE_PROVIDER_SITE_OTHER): Payer: Self-pay | Admitting: Orthopedic Surgery

## 2018-03-14 VITALS — Ht 66.0 in | Wt 203.0 lb

## 2018-03-14 DIAGNOSIS — M76822 Posterior tibial tendinitis, left leg: Secondary | ICD-10-CM | POA: Diagnosis not present

## 2018-03-14 MED ORDER — OXYCODONE-ACETAMINOPHEN 5-325 MG PO TABS: 1.0000 | ORAL_TABLET | Freq: Three times a day (TID) | ORAL | 0 refills | Status: DC | PRN

## 2018-03-14 NOTE — Progress Notes (Signed)
Office Visit Note   Patient: Jamie Jenkins           Date of Birth: Aug 17, 1960           MRN: [id number] Visit Date: 03/14/2018              Requested by: Janith Lima, MD [address] [place], [place] [address] PCP: Janith Lima, MD  Chief Complaint  Patient presents with  . Left Foot - Follow-up    7 months s/p subtalar and talonavicular fusion       HPI: Patient is a 57 year old woman 7 months status post subtalar and talonavicular fusion.  Patient complains anteriorly over the ankle joint she states she needs a refill for her pain medication she states she is going to therapy 2 times a week and states that she is working about 7 hours a day on her feet.  She states that after 5 hours of standing her ankle is painful.   Assessment & Plan: Visit Diagnoses:  1. Posterior tibial tendon dysfunction (PTTD) of left lower extremity     Plan: Patient did not want to try a steroid injection for the impingement of the ankle we will place her in an ASO to unload pressure from the ankle she is given a note that she may not work more than 7 hours a day.  Follow-Up Instructions: Return in about 4 weeks (around 04/11/2018).   Ortho Exam  Patient is alert, oriented, no adenopathy, well-dressed, normal affect, normal respiratory effort. Examination patient's foot is plantigrade she has pain to palpation of the anterior medial and anterior lateral joint line of the ankle.  Maximum dorsiflexion plantarflexion reproduces ankle pain the sinus Tarsi and talonavicular joints are nontender to palpation.  Imaging: No results found. No images are attached to the encounter.  Labs: Lab Results  Component Value Date   HGBA1C 5.6 10/05/2017   HGBA1C 5.5 06/23/2016     Lab Results  Component Value Date   ALBUMIN 3.7 10/05/2017   ALBUMIN 3.9 07/16/2017   ALBUMIN 4.3 06/23/2016    Body mass index is 32.77 kg/m.  Orders:  No orders of the defined types were placed in  this encounter.  Meds ordered this encounter  Medications  . oxyCODONE-acetaminophen (PERCOCET/ROXICET) 5-325 MG tablet    Sig: Take 1 tablet by mouth every 8 (eight) hours as needed.    Dispense:  20 tablet    Refill:  0     Procedures: No procedures performed  Clinical Data: No additional findings.  ROS:  All other systems negative, except as noted in the HPI. Review of Systems  Objective: Vital Signs: Ht 5\' 6"  (1.676 m)   Wt 203 lb (92.1 kg)   LMP 08/23/2012   BMI 32.77 kg/m   Specialty Comments:  No specialty comments available.  PMFS History: Patient Active Problem List   Diagnosis Date Noted  . Hyperglycemia 10/05/2017  . Vitamin D deficiency disease 10/05/2017  . Obesity 06/08/2016  . Screening for malignant neoplasm of the rectum 06/06/2015  . DDD (degenerative disc disease), lumbar 05/21/2014  . Essential hypertension 05/21/2014   Past Medical History:  Diagnosis Date  . DDD (degenerative disc disease), lumbar   . Gall stones   . Hypertension     Family History  Problem Relation Age of Onset  . Pancreatic cancer Mother        Deceased  . Hypertension Father   . Deep vein thrombosis Brother  Deceased  . Healthy Sister   . Healthy Son   . Healthy Daughter     Past Surgical History:  Procedure Laterality Date  . ANKLE FUSION Left 07/21/2017   Procedure: SUBTALAR AND TALONAVICULAR FUSION LEFT FOOT;  Surgeon: Newt Minion, MD;  Location: [facility];  Service: Orthopedics;  Laterality: Left;  . AXILLARY LYMPH NODE DISSECTION Left 11/24/2012   Procedure: incision and drainage axillary abscess;  Surgeon: Adin Hector, MD;  Location: [facility];  Service: General;  Laterality: Left;  . BREAST SURGERY  2012   breast biopsy  . CHOLECYSTECTOMY    . TUBAL LIGATION     Social History   Occupational History  . Occupation: Scientist, research (medical)  Tobacco Use  . Smoking status: Never Smoker  . Smokeless tobacco: Never Used  Substance and Sexual Activity    . Alcohol use: No    Alcohol/week: 0.0 standard drinks  . Drug use: No  . Sexual activity: Not on file

## 2018-03-15 DIAGNOSIS — R531 Weakness: Secondary | ICD-10-CM | POA: Diagnosis not present

## 2018-03-15 DIAGNOSIS — M25472 Effusion, left ankle: Secondary | ICD-10-CM | POA: Diagnosis not present

## 2018-03-15 DIAGNOSIS — M25572 Pain in left ankle and joints of left foot: Secondary | ICD-10-CM | POA: Diagnosis not present

## 2018-03-15 DIAGNOSIS — M25672 Stiffness of left ankle, not elsewhere classified: Secondary | ICD-10-CM | POA: Diagnosis not present

## 2018-03-18 DIAGNOSIS — M25572 Pain in left ankle and joints of left foot: Secondary | ICD-10-CM | POA: Diagnosis not present

## 2018-03-18 DIAGNOSIS — M25672 Stiffness of left ankle, not elsewhere classified: Secondary | ICD-10-CM | POA: Diagnosis not present

## 2018-03-18 DIAGNOSIS — M25472 Effusion, left ankle: Secondary | ICD-10-CM | POA: Diagnosis not present

## 2018-03-18 DIAGNOSIS — R531 Weakness: Secondary | ICD-10-CM | POA: Diagnosis not present

## 2018-03-23 DIAGNOSIS — M25572 Pain in left ankle and joints of left foot: Secondary | ICD-10-CM | POA: Diagnosis not present

## 2018-03-23 DIAGNOSIS — R531 Weakness: Secondary | ICD-10-CM | POA: Diagnosis not present

## 2018-03-23 DIAGNOSIS — M25672 Stiffness of left ankle, not elsewhere classified: Secondary | ICD-10-CM | POA: Diagnosis not present

## 2018-03-23 DIAGNOSIS — M25472 Effusion, left ankle: Secondary | ICD-10-CM | POA: Diagnosis not present

## 2018-03-25 DIAGNOSIS — R531 Weakness: Secondary | ICD-10-CM | POA: Diagnosis not present

## 2018-03-25 DIAGNOSIS — M25472 Effusion, left ankle: Secondary | ICD-10-CM | POA: Diagnosis not present

## 2018-03-25 DIAGNOSIS — M25572 Pain in left ankle and joints of left foot: Secondary | ICD-10-CM | POA: Diagnosis not present

## 2018-03-25 DIAGNOSIS — M25672 Stiffness of left ankle, not elsewhere classified: Secondary | ICD-10-CM | POA: Diagnosis not present

## 2018-03-30 DIAGNOSIS — M25572 Pain in left ankle and joints of left foot: Secondary | ICD-10-CM | POA: Diagnosis not present

## 2018-03-30 DIAGNOSIS — M25472 Effusion, left ankle: Secondary | ICD-10-CM | POA: Diagnosis not present

## 2018-03-30 DIAGNOSIS — R531 Weakness: Secondary | ICD-10-CM | POA: Diagnosis not present

## 2018-03-30 DIAGNOSIS — M25672 Stiffness of left ankle, not elsewhere classified: Secondary | ICD-10-CM | POA: Diagnosis not present

## 2018-04-01 DIAGNOSIS — M25472 Effusion, left ankle: Secondary | ICD-10-CM | POA: Diagnosis not present

## 2018-04-01 DIAGNOSIS — M25672 Stiffness of left ankle, not elsewhere classified: Secondary | ICD-10-CM | POA: Diagnosis not present

## 2018-04-01 DIAGNOSIS — R531 Weakness: Secondary | ICD-10-CM | POA: Diagnosis not present

## 2018-04-01 DIAGNOSIS — M25572 Pain in left ankle and joints of left foot: Secondary | ICD-10-CM | POA: Diagnosis not present

## 2018-04-05 DIAGNOSIS — M25572 Pain in left ankle and joints of left foot: Secondary | ICD-10-CM | POA: Diagnosis not present

## 2018-04-05 DIAGNOSIS — M25672 Stiffness of left ankle, not elsewhere classified: Secondary | ICD-10-CM | POA: Diagnosis not present

## 2018-04-05 DIAGNOSIS — M25472 Effusion, left ankle: Secondary | ICD-10-CM | POA: Diagnosis not present

## 2018-04-05 DIAGNOSIS — R531 Weakness: Secondary | ICD-10-CM | POA: Diagnosis not present

## 2018-04-07 DIAGNOSIS — M25672 Stiffness of left ankle, not elsewhere classified: Secondary | ICD-10-CM | POA: Diagnosis not present

## 2018-04-07 DIAGNOSIS — M25472 Effusion, left ankle: Secondary | ICD-10-CM | POA: Diagnosis not present

## 2018-04-07 DIAGNOSIS — R531 Weakness: Secondary | ICD-10-CM | POA: Diagnosis not present

## 2018-04-07 DIAGNOSIS — M25572 Pain in left ankle and joints of left foot: Secondary | ICD-10-CM | POA: Diagnosis not present

## 2018-04-11 ENCOUNTER — Encounter (INDEPENDENT_AMBULATORY_CARE_PROVIDER_SITE_OTHER): Payer: Self-pay | Admitting: Orthopedic Surgery

## 2018-04-11 ENCOUNTER — Ambulatory Visit (INDEPENDENT_AMBULATORY_CARE_PROVIDER_SITE_OTHER): Payer: BLUE CROSS/BLUE SHIELD | Admitting: Orthopedic Surgery

## 2018-04-11 DIAGNOSIS — M25472 Effusion, left ankle: Secondary | ICD-10-CM | POA: Diagnosis not present

## 2018-04-11 DIAGNOSIS — M25672 Stiffness of left ankle, not elsewhere classified: Secondary | ICD-10-CM | POA: Diagnosis not present

## 2018-04-11 DIAGNOSIS — M76822 Posterior tibial tendinitis, left leg: Secondary | ICD-10-CM

## 2018-04-11 DIAGNOSIS — M25572 Pain in left ankle and joints of left foot: Secondary | ICD-10-CM | POA: Diagnosis not present

## 2018-04-11 DIAGNOSIS — R531 Weakness: Secondary | ICD-10-CM | POA: Diagnosis not present

## 2018-04-11 NOTE — Progress Notes (Signed)
Office Visit Note   Patient: Jamie Jenkins           Date of Birth: 1960-04-06           MRN: [id number] Visit Date: 04/11/2018              Requested by: Janith Lima, MD [address] [place], [place] [address] PCP: Janith Lima, MD  Chief Complaint  Patient presents with  . Right Foot - Follow-up      HPI: Patient is a 58 year old woman who presents status post talonavicular and subtalar fusion on the left.  Patient states she continues to make improvement with physical therapy with decreased swelling she has been doing scar massage she is wearing the medical compression stockings.  Assessment & Plan: Visit Diagnoses:  1. Posterior tibial tendon dysfunction (PTTD) of left lower extremity     Plan: Patient is given a note to continue with physical therapy for several more weeks recommended continue scar massage continue with compression sock.  Follow-Up Instructions: Return if symptoms worsen or fail to improve.   Ortho Exam  Patient is alert, oriented, no adenopathy, well-dressed, normal affect, normal respiratory effort. Examination the scar tissue around the surgical incisions continues to improve there is no redness no cellulitis no signs of infection.  Her foot is plantigrade.  Patient states she continues to make slow steady progress.  Imaging: No results found. No images are attached to the encounter.  Labs: Lab Results  Component Value Date   HGBA1C 5.6 10/05/2017   HGBA1C 5.5 06/23/2016     Lab Results  Component Value Date   ALBUMIN 3.7 10/05/2017   ALBUMIN 3.9 07/16/2017   ALBUMIN 4.3 06/23/2016    There is no height or weight on file to calculate BMI.  Orders:  No orders of the defined types were placed in this encounter.  No orders of the defined types were placed in this encounter.    Procedures: No procedures performed  Clinical Data: No additional findings.  ROS:  All other systems negative, except as noted in  the HPI. Review of Systems  Objective: Vital Signs: LMP 08/23/2012   Specialty Comments:  No specialty comments available.  PMFS History: Patient Active Problem List   Diagnosis Date Noted  . Hyperglycemia 10/05/2017  . Vitamin D deficiency disease 10/05/2017  . Obesity 06/08/2016  . Screening for malignant neoplasm of the rectum 06/06/2015  . DDD (degenerative disc disease), lumbar 05/21/2014  . Essential hypertension 05/21/2014   Past Medical History:  Diagnosis Date  . DDD (degenerative disc disease), lumbar   . Gall stones   . Hypertension     Family History  Problem Relation Age of Onset  . Pancreatic cancer Mother        Deceased  . Hypertension Father   . Deep vein thrombosis Brother        Deceased  . Healthy Sister   . Healthy Son   . Healthy Daughter     Past Surgical History:  Procedure Laterality Date  . ANKLE FUSION Left 07/21/2017   Procedure: SUBTALAR AND TALONAVICULAR FUSION LEFT FOOT;  Surgeon: Newt Minion, MD;  Location: [facility];  Service: Orthopedics;  Laterality: Left;  . AXILLARY LYMPH NODE DISSECTION Left 11/24/2012   Procedure: incision and drainage axillary abscess;  Surgeon: Adin Hector, MD;  Location: [facility];  Service: General;  Laterality: Left;  . BREAST SURGERY  2012   breast biopsy  .  CHOLECYSTECTOMY    . TUBAL LIGATION     Social History   Occupational History  . Occupation: Scientist, research (medical)  Tobacco Use  . Smoking status: Never Smoker  . Smokeless tobacco: Never Used  Substance and Sexual Activity  . Alcohol use: No    Alcohol/week: 0.0 standard drinks  . Drug use: No  . Sexual activity: Not on file

## 2018-04-13 DIAGNOSIS — M25672 Stiffness of left ankle, not elsewhere classified: Secondary | ICD-10-CM | POA: Diagnosis not present

## 2018-04-13 DIAGNOSIS — M25472 Effusion, left ankle: Secondary | ICD-10-CM | POA: Diagnosis not present

## 2018-04-13 DIAGNOSIS — R531 Weakness: Secondary | ICD-10-CM | POA: Diagnosis not present

## 2018-04-13 DIAGNOSIS — M25572 Pain in left ankle and joints of left foot: Secondary | ICD-10-CM | POA: Diagnosis not present

## 2018-04-18 DIAGNOSIS — M25572 Pain in left ankle and joints of left foot: Secondary | ICD-10-CM | POA: Diagnosis not present

## 2018-04-18 DIAGNOSIS — M25672 Stiffness of left ankle, not elsewhere classified: Secondary | ICD-10-CM | POA: Diagnosis not present

## 2018-04-18 DIAGNOSIS — R531 Weakness: Secondary | ICD-10-CM | POA: Diagnosis not present

## 2018-04-18 DIAGNOSIS — M25472 Effusion, left ankle: Secondary | ICD-10-CM | POA: Diagnosis not present

## 2018-04-20 DIAGNOSIS — M25472 Effusion, left ankle: Secondary | ICD-10-CM | POA: Diagnosis not present

## 2018-04-20 DIAGNOSIS — R531 Weakness: Secondary | ICD-10-CM | POA: Diagnosis not present

## 2018-04-20 DIAGNOSIS — M25572 Pain in left ankle and joints of left foot: Secondary | ICD-10-CM | POA: Diagnosis not present

## 2018-04-20 DIAGNOSIS — M25672 Stiffness of left ankle, not elsewhere classified: Secondary | ICD-10-CM | POA: Diagnosis not present

## 2018-06-20 ENCOUNTER — Other Ambulatory Visit: Payer: Self-pay | Admitting: Internal Medicine

## 2018-07-06 ENCOUNTER — Encounter (HOSPITAL_COMMUNITY): Payer: Self-pay | Admitting: Orthopedic Surgery

## 2018-08-20 ENCOUNTER — Other Ambulatory Visit: Payer: Self-pay | Admitting: Critical Care Medicine

## 2018-08-20 DIAGNOSIS — Z20822 Contact with and (suspected) exposure to covid-19: Secondary | ICD-10-CM

## 2018-08-22 LAB — NOVEL CORONAVIRUS, NAA: SARS-CoV-2, NAA: NOT DETECTED

## 2018-08-24 ENCOUNTER — Telehealth: Payer: Self-pay

## 2018-08-24 NOTE — Telephone Encounter (Signed)
Pt disconnected before being transferred to [place]

## 2018-08-24 NOTE — Telephone Encounter (Signed)
Pt returned call and was notified that her test for COVID-19 was negative. She was not infected with the novel coronavirus. Pt deny symptoms.  She was advised to call back if symptoms develop.

## 2019-02-07 ENCOUNTER — Other Ambulatory Visit: Payer: Self-pay | Admitting: Internal Medicine

## 2019-02-07 ENCOUNTER — Telehealth: Payer: Self-pay

## 2019-02-07 DIAGNOSIS — I10 Essential (primary) hypertension: Secondary | ICD-10-CM

## 2019-02-07 MED ORDER — HYDROCHLOROTHIAZIDE 25 MG PO TABS: ORAL_TABLET | ORAL | 0 refills | Status: DC

## 2019-02-07 NOTE — Telephone Encounter (Signed)
Rf rq from [facility] for HCTZ. Pt is due for an appt per PCP.   Pt contacted and has scheduled for 11.30 at 3:40. Please advise if refill can be sent in.

## 2019-02-07 NOTE — Telephone Encounter (Signed)
RX sent  TJ 

## 2019-02-13 ENCOUNTER — Other Ambulatory Visit: Payer: Self-pay

## 2019-02-13 ENCOUNTER — Ambulatory Visit (INDEPENDENT_AMBULATORY_CARE_PROVIDER_SITE_OTHER): Payer: BC Managed Care – PPO | Admitting: Internal Medicine

## 2019-02-13 ENCOUNTER — Encounter: Payer: Self-pay | Admitting: Internal Medicine

## 2019-02-13 ENCOUNTER — Other Ambulatory Visit (INDEPENDENT_AMBULATORY_CARE_PROVIDER_SITE_OTHER): Payer: BC Managed Care – PPO

## 2019-02-13 VITALS — BP 136/88 | HR 76 | Temp 98.0°F | Resp 16 | Ht 66.0 in | Wt 213.0 lb

## 2019-02-13 DIAGNOSIS — Z1231 Encounter for screening mammogram for malignant neoplasm of breast: Secondary | ICD-10-CM

## 2019-02-13 DIAGNOSIS — K219 Gastro-esophageal reflux disease without esophagitis: Secondary | ICD-10-CM

## 2019-02-13 DIAGNOSIS — I10 Essential (primary) hypertension: Secondary | ICD-10-CM

## 2019-02-13 DIAGNOSIS — R739 Hyperglycemia, unspecified: Secondary | ICD-10-CM

## 2019-02-13 DIAGNOSIS — E559 Vitamin D deficiency, unspecified: Secondary | ICD-10-CM

## 2019-02-13 DIAGNOSIS — Z0000 Encounter for general adult medical examination without abnormal findings: Secondary | ICD-10-CM | POA: Diagnosis not present

## 2019-02-13 DIAGNOSIS — Z124 Encounter for screening for malignant neoplasm of cervix: Secondary | ICD-10-CM | POA: Insufficient documentation

## 2019-02-13 LAB — LIPID PANEL
Cholesterol: 158 mg/dL (ref 0–200)
HDL: 50 mg/dL (ref >39.00)
LDL Cholesterol: 72 mg/dL (ref 0–99)
NonHDL: 108.42
Total CHOL/HDL Ratio: 3
Triglycerides: 183 mg/dL — ABNORMAL HIGH (ref 0.0–149.0)
VLDL: 36.6 mg/dL (ref 0.0–40.0)

## 2019-02-13 LAB — CBC WITH DIFFERENTIAL/PLATELET
Basophils Absolute: 0.1 10*3/uL (ref 0.0–0.1)
Basophils Relative: 0.9 % (ref 0.0–3.0)
Eosinophils Absolute: 0.1 10*3/uL (ref 0.0–0.7)
Eosinophils Relative: 0.8 % (ref 0.0–5.0)
HCT: 43.2 % (ref 36.0–46.0)
Hemoglobin: 14.6 g/dL (ref 12.0–15.0)
Lymphocytes Relative: 37.8 % (ref 12.0–46.0)
Lymphs Abs: 2.5 10*3/uL (ref 0.7–4.0)
MCHC: 33.7 g/dL (ref 30.0–36.0)
MCV: 96.2 fl (ref 78.0–100.0)
Monocytes Absolute: 0.4 10*3/uL (ref 0.1–1.0)
Monocytes Relative: 5.6 % (ref 3.0–12.0)
Neutro Abs: 3.6 10*3/uL (ref 1.4–7.7)
Neutrophils Relative %: 54.9 % (ref 43.0–77.0)
Platelets: 249 10*3/uL (ref 150.0–400.0)
RBC: 4.49 Mil/uL (ref 3.87–5.11)
RDW: 13.1 % (ref 11.5–15.5)
WBC: 6.6 10*3/uL (ref 4.0–10.5)

## 2019-02-13 LAB — BASIC METABOLIC PANEL
BUN: 16 mg/dL (ref 6–23)
CO2: 28 mEq/L (ref 19–32)
Calcium: 9.6 mg/dL (ref 8.4–10.5)
Chloride: 104 mEq/L (ref 96–112)
Creatinine, Ser: 0.95 mg/dL (ref 0.40–1.20)
GFR: 72.97 mL/min (ref >60.00)
Glucose, Bld: 102 mg/dL — ABNORMAL HIGH (ref 70–99)
Potassium: 4.2 mEq/L (ref 3.5–5.1)
Sodium: 139 mEq/L (ref 135–145)

## 2019-02-13 LAB — HEMOGLOBIN A1C: Hgb A1c MFr Bld: 5.5 % (ref 4.6–6.5)

## 2019-02-13 MED ORDER — ESOMEPRAZOLE MAGNESIUM 40 MG PO CPDR: 40.0000 mg | DELAYED_RELEASE_CAPSULE | Freq: Every day | ORAL | 1 refills | Status: DC

## 2019-02-13 NOTE — Progress Notes (Signed)
Subjective:  Patient ID: Jamie Jenkins, female    DOB: 08/05/1960  Age: 58 y.o. MRN: [id number]  CC: Hypertension, Annual Exam, and Gastroesophageal Reflux  This visit occurred during the SARS-CoV-2 public health emergency.  Safety protocols were in place, including screening questions prior to the visit, additional usage of staff PPE, and extensive cleaning of exam room while observing appropriate contact time as indicated for disinfecting solutions.    HPI Jamie Jenkins presents for a CPX.  She tells me her blood pressure has been well controlled though she is not working on her lifestyle modifications.  She denies headache, chest pain, shortness of breath, blurred vision, dizziness, lightheadedness, DOE, or edema.  She complains of mild heartburn and tells me she is not getting much symptom relief with Tums.  She denies odynophagia or dysphagia.  Outpatient Medications Prior to Visit  Medication Sig Dispense Refill  . gabapentin (NEURONTIN) 300 MG capsule Take 1 capsule (300 mg total) by mouth 3 (three) times daily. 3 times a day when necessary neuropathy pain 90 capsule 3  . hydrochlorothiazide (HYDRODIURIL) 25 MG tablet TAKE 1 TABLET(25 MG) BY MOUTH DAILY 30 tablet 0  . Cholecalciferol 50000 units capsule Take 1 capsule (50,000 Units total) by mouth once a week. 12 capsule 1   No facility-administered medications prior to visit.     ROS Review of Systems  Constitutional: Positive for unexpected weight change (wt gain). Negative for appetite change, chills, diaphoresis and fatigue.  HENT: Negative.  Negative for trouble swallowing and voice change.   Eyes: Negative.   Respiratory: Negative for cough, chest tightness, shortness of breath and wheezing.   Cardiovascular: Negative for chest pain, palpitations and leg swelling.  Gastrointestinal: Negative for abdominal pain, blood in stool, constipation, diarrhea, nausea and vomiting.  Endocrine: Negative.  Negative for cold  intolerance and heat intolerance.  Genitourinary: Negative.  Negative for difficulty urinating.  Musculoskeletal: Negative.  Negative for arthralgias and myalgias.  Skin: Negative.  Negative for color change and pallor.  Neurological: Negative.  Negative for dizziness, weakness, light-headedness and headaches.  Hematological: Negative.  Negative for adenopathy. Does not bruise/bleed easily.  Psychiatric/Behavioral: Negative.     Objective:  BP 136/88   Pulse 76   Temp 98 F (36.7 C) (Oral)   Resp 16   Ht 5\' 6"  (1.676 m)   Wt 213 lb (96.6 kg)   LMP 08/23/2012   SpO2 98%   BMI 34.38 kg/m   BP Readings from Last 3 Encounters:  02/13/19 136/88  10/05/17 110/70  07/21/17 103/65    Wt Readings from Last 3 Encounters:  02/13/19 213 lb (96.6 kg)  03/14/18 203 lb (92.1 kg)  02/03/18 203 lb (92.1 kg)    Physical Exam Constitutional:      Appearance: She is obese.  HENT:     Nose: Nose normal.     Mouth/Throat:     Pharynx: Oropharynx is clear.  Eyes:     General: No scleral icterus.    Conjunctiva/sclera: Conjunctivae normal.  Neck:     Musculoskeletal: Neck supple.  Cardiovascular:     Rate and Rhythm: Normal rate and regular rhythm.     Heart sounds: No murmur.  Pulmonary:     Effort: Pulmonary effort is normal.     Breath sounds: No stridor. No wheezing, rhonchi or rales.  Abdominal:     General: Abdomen is protuberant. Bowel sounds are normal. There is no distension.     Palpations: There is no  hepatomegaly or splenomegaly.     Tenderness: There is no abdominal tenderness.  Musculoskeletal: Normal range of motion.     Right lower leg: No edema.     Left lower leg: No edema.  Lymphadenopathy:     Cervical: No cervical adenopathy.  Skin:    General: Skin is warm and dry.     Coloration: Skin is not pale.  Neurological:     General: No focal deficit present.     Mental Status: She is alert.  Psychiatric:        Mood and Affect: Mood normal.        Behavior:  Behavior normal.     Lab Results  Component Value Date   WBC 6.6 02/13/2019   HGB 14.6 02/13/2019   HCT 43.2 02/13/2019   PLT 249.0 02/13/2019   GLUCOSE 102 (H) 02/13/2019   CHOL 158 02/13/2019   TRIG 183.0 (H) 02/13/2019   HDL 50.00 02/13/2019   LDLCALC 72 02/13/2019   ALT 14 10/05/2017   AST 11 10/05/2017   NA 139 02/13/2019   K 4.2 02/13/2019   CL 104 02/13/2019   CREATININE 0.95 02/13/2019   BUN 16 02/13/2019   CO2 28 02/13/2019   TSH 2.04 02/13/2019   INR 1.01 07/16/2017   HGBA1C 5.5 02/13/2019    Mm 3d Screen Breast Bilateral  Result Date: 12/06/2017 CLINICAL DATA:  Screening. EXAM: DIGITAL SCREENING BILATERAL MAMMOGRAM WITH TOMO AND CAD COMPARISON:  Previous exam(s). ACR Breast Density Category b: There are scattered areas of fibroglandular density. FINDINGS: There are no findings suspicious for malignancy. Images were processed with CAD. IMPRESSION: No mammographic evidence of malignancy. A result letter of this screening mammogram will be mailed directly to the patient. RECOMMENDATION: Screening mammogram in one year. (Code:SM-B-01Y) BI-RADS CATEGORY  1: Negative. Electronically Signed   By: Lovey Newcomer M.D.   On: 12/06/2017 10:37    Assessment & Plan:   Jamie Jenkins was seen today for hypertension, annual exam and gastroesophageal reflux.  Diagnoses and all orders for this visit:  Essential hypertension- Her blood pressure is adequately well controlled. -     Basic metabolic panel; Future -     CBC with Differential; Future -     TSH; Future  Vitamin D deficiency disease- Her vitamin D level remains low.  I have asked her to restart the vitamin D supplement. -     Vitamin D 25 hydroxy; Future  Routine general medical examination at a health care facility- Exam completed, labs reviewed-she has a low ASCVD risk score so I did not recommend a statin for CV risk reduction, she refused a flu vaccine, colon cancer screening is up-to-date, she is referred for mammogram and  cervical cancer screening. -     Lipid panel; Future  Hyperglycemia- Her blood sugars are normal now. -     Hemoglobin A1c; Future  Gastroesophageal reflux disease without esophagitis- I recommended she treat this with a PPI. -     esomeprazole (NEXIUM) 40 MG capsule; Take 1 capsule (40 mg total) by mouth daily at 12 noon.  Screening for cervical cancer -     Ambulatory referral to Gynecology  Visit for screening mammogram -     MM Digital Screening; Future   I have changed Jamie Jenkins's Cholecalciferol. I am also having her start on esomeprazole. Additionally, I am having her maintain her gabapentin and hydrochlorothiazide.  Meds ordered this encounter  Medications  . esomeprazole (NEXIUM) 40 MG capsule  Sig: Take 1 capsule (40 mg total) by mouth daily at 12 noon.    Dispense:  90 capsule    Refill:  1  . Cholecalciferol 1.25 MG (50000 UT) capsule    Sig: Take 1 capsule (50,000 Units total) by mouth once a week.    Dispense:  12 capsule    Refill:  1     Follow-up: Return in about 6 months (around 08/13/2019).  Scarlette Calico, MD

## 2019-02-13 NOTE — Patient Instructions (Signed)
Health Maintenance, Female Adopting a healthy lifestyle and getting preventive care are important in promoting health and wellness. Ask your health care provider about:  The right schedule for you to have regular tests and exams.  Things you can do on your own to prevent diseases and keep yourself healthy. What should I know about diet, weight, and exercise? Eat a healthy diet   Eat a diet that includes plenty of vegetables, fruits, low-fat dairy products, and lean protein.  Do not eat a lot of foods that are high in solid fats, added sugars, or sodium. Maintain a healthy weight Body mass index (BMI) is used to identify weight problems. It estimates body fat based on height and weight. Your health care provider can help determine your BMI and help you achieve or maintain a healthy weight. Get regular exercise Get regular exercise. This is one of the most important things you can do for your health. Most adults should:  Exercise for at least 150 minutes each week. The exercise should increase your heart rate and make you sweat (moderate-intensity exercise).  Do strengthening exercises at least twice a week. This is in addition to the moderate-intensity exercise.  Spend less time sitting. Even light physical activity can be beneficial. Watch cholesterol and blood lipids Have your blood tested for lipids and cholesterol at 58 years of age, then have this test every 5 years. Have your cholesterol levels checked more often if:  Your lipid or cholesterol levels are high.  You are older than 58 years of age.  You are at high risk for heart disease. What should I know about cancer screening? Depending on your health history and family history, you may need to have cancer screening at various ages. This may include screening for:  Breast cancer.  Cervical cancer.  Colorectal cancer.  Skin cancer.  Lung cancer. What should I know about heart disease, diabetes, and high blood  pressure? Blood pressure and heart disease  High blood pressure causes heart disease and increases the risk of stroke. This is more likely to develop in people who have high blood pressure readings, are of African descent, or are overweight.  Have your blood pressure checked: ? Every 3-5 years if you are 18-39 years of age. ? Every year if you are 40 years old or older. Diabetes Have regular diabetes screenings. This checks your fasting blood sugar level. Have the screening done:  Once every three years after age 40 if you are at a normal weight and have a low risk for diabetes.  More often and at a younger age if you are overweight or have a high risk for diabetes. What should I know about preventing infection? Hepatitis B If you have a higher risk for hepatitis B, you should be screened for this virus. Talk with your health care provider to find out if you are at risk for hepatitis B infection. Hepatitis C Testing is recommended for:  Everyone born from 1945 through 1965.  Anyone with known risk factors for hepatitis C. Sexually transmitted infections (STIs)  Get screened for STIs, including gonorrhea and chlamydia, if: ? You are sexually active and are younger than 58 years of age. ? You are older than 58 years of age and your health care provider tells you that you are at risk for this type of infection. ? Your sexual activity has changed since you were last screened, and you are at increased risk for chlamydia or gonorrhea. Ask your health care provider if   you are at risk.  Ask your health care provider about whether you are at high risk for HIV. Your health care provider may recommend a prescription medicine to help prevent HIV infection. If you choose to take medicine to prevent HIV, you should first get tested for HIV. You should then be tested every 3 months for as long as you are taking the medicine. Pregnancy  If you are about to stop having your period (premenopausal) and  you may become pregnant, seek counseling before you get pregnant.  Take 400 to 800 micrograms (mcg) of folic acid every day if you become pregnant.  Ask for birth control (contraception) if you want to prevent pregnancy. Osteoporosis and menopause Osteoporosis is a disease in which the bones lose minerals and strength with aging. This can result in bone fractures. If you are 65 years old or older, or if you are at risk for osteoporosis and fractures, ask your health care provider if you should:  Be screened for bone loss.  Take a calcium or vitamin D supplement to lower your risk of fractures.  Be given hormone replacement therapy (HRT) to treat symptoms of menopause. Follow these instructions at home: Lifestyle  Do not use any products that contain nicotine or tobacco, such as cigarettes, e-cigarettes, and chewing tobacco. If you need help quitting, ask your health care provider.  Do not use street drugs.  Do not share needles.  Ask your health care provider for help if you need support or information about quitting drugs. Alcohol use  Do not drink alcohol if: ? Your health care provider tells you not to drink. ? You are pregnant, may be pregnant, or are planning to become pregnant.  If you drink alcohol: ? Limit how much you use to 0-1 drink a day. ? Limit intake if you are breastfeeding.  Be aware of how much alcohol is in your drink. In the [place] one drink equals one 12 oz bottle of beer (355 mL), one 5 oz glass of wine (148 mL), or one 1 oz glass of hard liquor (44 mL). General instructions  Schedule regular health, dental, and eye exams.  Stay current with your vaccines.  Tell your health care provider if: ? You often feel depressed. ? You have ever been abused or do not feel safe at home. Summary  Adopting a healthy lifestyle and getting preventive care are important in promoting health and wellness.  Follow your health care provider's instructions about healthy  diet, exercising, and getting tested or screened for diseases.  Follow your health care provider's instructions on monitoring your cholesterol and blood pressure. This information is not intended to replace advice given to you by your health care provider. Make sure you discuss any questions you have with your health care provider. Document Released: 09/15/2010 Document Revised: 02/23/2018 Document Reviewed: 02/23/2018 Elsevier Patient Education  2020 [employer]  

## 2019-02-14 ENCOUNTER — Encounter: Payer: Self-pay | Admitting: Internal Medicine

## 2019-02-14 LAB — VITAMIN D 25 HYDROXY (VIT D DEFICIENCY, FRACTURES): VITD: 20.39 ng/mL — ABNORMAL LOW (ref 30.00–100.00)

## 2019-02-14 LAB — TSH: TSH: 2.04 u[IU]/mL (ref 0.35–4.50)

## 2019-02-14 MED ORDER — CHOLECALCIFEROL 1.25 MG (50000 UT) PO CAPS: 50000.0000 [IU] | ORAL_CAPSULE | ORAL | 1 refills | Status: DC

## 2019-02-22 ENCOUNTER — Other Ambulatory Visit: Payer: Self-pay | Admitting: Internal Medicine

## 2019-02-22 DIAGNOSIS — I10 Essential (primary) hypertension: Secondary | ICD-10-CM

## 2019-02-22 MED ORDER — HYDROCHLOROTHIAZIDE 25 MG PO TABS: ORAL_TABLET | ORAL | 1 refills | Status: DC

## 2019-02-28 ENCOUNTER — Encounter: Payer: Self-pay | Admitting: Internal Medicine

## 2019-04-10 ENCOUNTER — Ambulatory Visit
Admission: RE | Admit: 2019-04-10 | Discharge: 2019-04-10 | Disposition: A | Discharge status: Home / Self Care | Payer: BC Managed Care – PPO | Source: Ambulatory Visit | Attending: Internal Medicine | Admitting: Internal Medicine

## 2019-04-10 ENCOUNTER — Other Ambulatory Visit: Payer: Self-pay

## 2019-04-10 ENCOUNTER — Other Ambulatory Visit: Payer: Self-pay | Admitting: Internal Medicine

## 2019-04-10 DIAGNOSIS — Z1231 Encounter for screening mammogram for malignant neoplasm of breast: Secondary | ICD-10-CM

## 2019-04-11 LAB — HM MAMMOGRAPHY

## 2019-11-16 ENCOUNTER — Emergency Department (HOSPITAL_COMMUNITY): Payer: No Typology Code available for payment source

## 2019-11-16 ENCOUNTER — Emergency Department (HOSPITAL_COMMUNITY)
Admission: EM | Admit: 2019-11-16 | Discharge: 2019-11-16 | Disposition: A | Discharge status: Home / Self Care | Payer: No Typology Code available for payment source | Attending: Emergency Medicine | Admitting: Emergency Medicine

## 2019-11-16 ENCOUNTER — Other Ambulatory Visit: Payer: Self-pay

## 2019-11-16 ENCOUNTER — Encounter (HOSPITAL_COMMUNITY): Payer: Self-pay | Admitting: *Deleted

## 2019-11-16 DIAGNOSIS — I10 Essential (primary) hypertension: Secondary | ICD-10-CM | POA: Insufficient documentation

## 2019-11-16 DIAGNOSIS — Q7291 Unspecified reduction defect of right lower limb: Secondary | ICD-10-CM

## 2019-11-16 DIAGNOSIS — Y999 Unspecified external cause status: Secondary | ICD-10-CM | POA: Insufficient documentation

## 2019-11-16 DIAGNOSIS — Z79899 Other long term (current) drug therapy: Secondary | ICD-10-CM | POA: Insufficient documentation

## 2019-11-16 DIAGNOSIS — S99911A Unspecified injury of right ankle, initial encounter: Secondary | ICD-10-CM | POA: Diagnosis not present

## 2019-11-16 DIAGNOSIS — W19XXXA Unspecified fall, initial encounter: Secondary | ICD-10-CM

## 2019-11-16 DIAGNOSIS — S82851D Displaced trimalleolar fracture of right lower leg, subsequent encounter for closed fracture with routine healing: Secondary | ICD-10-CM | POA: Diagnosis not present

## 2019-11-16 DIAGNOSIS — S82851A Displaced trimalleolar fracture of right lower leg, initial encounter for closed fracture: Secondary | ICD-10-CM | POA: Insufficient documentation

## 2019-11-16 DIAGNOSIS — W1839XA Other fall on same level, initial encounter: Secondary | ICD-10-CM | POA: Diagnosis not present

## 2019-11-16 DIAGNOSIS — M25571 Pain in right ankle and joints of right foot: Secondary | ICD-10-CM | POA: Diagnosis not present

## 2019-11-16 DIAGNOSIS — R52 Pain, unspecified: Secondary | ICD-10-CM | POA: Diagnosis not present

## 2019-11-16 DIAGNOSIS — S8251XA Displaced fracture of medial malleolus of right tibia, initial encounter for closed fracture: Secondary | ICD-10-CM | POA: Diagnosis not present

## 2019-11-16 DIAGNOSIS — Y939 Activity, unspecified: Secondary | ICD-10-CM | POA: Insufficient documentation

## 2019-11-16 DIAGNOSIS — Y929 Unspecified place or not applicable: Secondary | ICD-10-CM | POA: Diagnosis not present

## 2019-11-16 MED ORDER — ONDANSETRON HCL 4 MG/2ML IJ SOLN
4.0000 mg | Freq: Once | INTRAMUSCULAR | Status: AC
  Administered 2019-11-16: 4 mg via INTRAVENOUS
  Filled 2019-11-16: qty 2

## 2019-11-16 MED ORDER — MIDAZOLAM HCL 2 MG/2ML IJ SOLN
INTRAMUSCULAR | Status: AC | PRN
  Administered 2019-11-16: 1 mg via INTRAVENOUS

## 2019-11-16 MED ORDER — ETOMIDATE 2 MG/ML IV SOLN
10.0000 mg | Freq: Once | INTRAVENOUS | Status: DC
  Filled 2019-11-16: qty 10

## 2019-11-16 MED ORDER — ETOMIDATE 2 MG/ML IV SOLN
INTRAVENOUS | Status: AC | PRN
  Administered 2019-11-16: 10 mg via INTRAVENOUS

## 2019-11-16 MED ORDER — MIDAZOLAM HCL 2 MG/2ML IJ SOLN
2.0000 mg | Freq: Once | INTRAMUSCULAR | Status: AC
  Administered 2019-11-16: 1 mg via INTRAVENOUS
  Filled 2019-11-16: qty 2

## 2019-11-16 MED ORDER — DIAZEPAM 5 MG PO TABS: 5.0000 mg | ORAL_TABLET | Freq: Two times a day (BID) | ORAL | 0 refills | Status: DC | PRN

## 2019-11-16 MED ORDER — FENTANYL CITRATE (PF) 100 MCG/2ML IJ SOLN
50.0000 ug | Freq: Once | INTRAMUSCULAR | Status: DC
  Filled 2019-11-16: qty 2

## 2019-11-16 MED ORDER — MORPHINE SULFATE (PF) 4 MG/ML IV SOLN
4.0000 mg | Freq: Once | INTRAVENOUS | Status: AC
  Administered 2019-11-16: 4 mg via INTRAVENOUS
  Filled 2019-11-16: qty 1

## 2019-11-16 MED ORDER — HYDROMORPHONE HCL 1 MG/ML IJ SOLN
0.5000 mg | Freq: Once | INTRAMUSCULAR | Status: AC
  Administered 2019-11-16: 0.5 mg via INTRAVENOUS
  Filled 2019-11-16: qty 1

## 2019-11-16 MED ORDER — OXYCODONE-ACETAMINOPHEN 5-325 MG PO TABS: 1.0000 | ORAL_TABLET | Freq: Four times a day (QID) | ORAL | 0 refills | Status: DC | PRN

## 2019-11-16 MED ORDER — KETOROLAC TROMETHAMINE 15 MG/ML IJ SOLN: 15.0000 mg | Freq: Once | INTRAMUSCULAR | Status: DC

## 2019-11-16 MED ORDER — HYDROMORPHONE HCL 1 MG/ML IJ SOLN
1.0000 mg | Freq: Once | INTRAMUSCULAR | Status: AC
  Administered 2019-11-16: 1 mg via INTRAVENOUS
  Filled 2019-11-16: qty 1

## 2019-11-16 NOTE — ED Provider Notes (Signed)
  Physical Exam  BP 119/64   Pulse 63   Temp 98.6 F (37 C) (Oral)   Resp 16   Ht 5\' 7"  (1.702 m)   Wt 99.8 kg   LMP 08/23/2012   SpO2 92%   BMI 34.46 kg/m   Physical Exam  ED Course/Procedures     .Sedation  Date/Time: 11/16/2019 3:08 PM Performed by: Isla Pence, MD Authorized by: Isla Pence, MD   Consent:    Consent obtained:  Written   Consent given by:  Patient Universal protocol:    Immediately prior to procedure a time out was called: yes     Patient identity confirmation method:  Verbally with patient Indications:    Procedure performed:  Fracture reduction   Procedure necessitating sedation performed by:  Different physician Pre-sedation assessment:    Time since last food or drink:  4   ASA classification: class 1 - normal, healthy patient     Mallampati score:  I - soft palate, uvula, fauces, pillars visible   Pre-sedation assessments completed and reviewed: airway patency, cardiovascular function, hydration status, mental status, nausea/vomiting, pain level, respiratory function and temperature   Immediate pre-procedure details:    Reassessment: Patient reassessed immediately prior to procedure   Procedure details (see MAR for exact dosages):    Preoxygenation:  Room air   Sedation:  Etomidate and midazolam   Intended level of sedation: deep   Analgesia:  Hydromorphone   Intra-procedure events: none     Total Provider sedation time (minutes):  30 Post-procedure details:    Attendance: Constant attendance by certified staff until patient recovered     Recovery: Patient returned to pre-procedure baseline     Patient tolerance:  Tolerated well, no immediate complications    MDM         Isla Pence, MD 11/16/19 1510

## 2019-11-16 NOTE — ED Notes (Signed)
Ice pack applied to right ankle upon arrival

## 2019-11-16 NOTE — ED Notes (Signed)
  Attempted to call husband [phone] no answer

## 2019-11-16 NOTE — Discharge Instructions (Addendum)
Please read and follow all provided instructions.  You have been seen today after an injury to your right ankle Your xray shows a trimalleolar fracture-  multiple fractures in the ankle. The ankle was also somewhat out of place which improved with the procedure we did in the ER.  We have placed you in a splint- please keep this clean & dry and intact until you have followed up with orthopedics.  Do not put any weight on this extremity Please call orthopedic surgery tomorrow to schedule an appointment within the next 3-5 days.   Home care instructions: -- *PRICE in the first 24-48 hours after injury: Protect with splint Rest Ice- Do not apply ice pack directly to your splint place towel or similar between your splint and ice/ice pack. Apply ice for 20 min, then remove for 40 min while awake Compression- splint Elevate affected extremity above the level of your heart when not walking around for the first 24-48 hours   Medications:  Please take ibuprofen per over the counter dosing to help with pain/swelling.  If your pain is not alleviated by ibuprofen please take percocet.  -Percocet-this is a narcotic/controlled substance medication that has potential addicting qualities.  We recommend that you take 1-2 tablets every 6 hours as needed for severe pain.  Do not drive or operate heavy machinery when taking this medicine as it can be sedating. Do not drink alcohol or take other sedating medications when taking this medicine for safety reasons.  Keep this out of reach of small children.  Please be aware this medicine has Tylenol in it (325 mg/tab) do not exceed the maximum dose of Tylenol in a day per over the counter recommendations should you decide to supplement with Tylenol over the counter.  - Valium- this is a controlled substance medication that has potential addicting qualities.  We recommend that you take 1 tablet twice per day as needed for muscle spasms.  Do not drive or operate heavy  machinery when taking this medicine as it can be sedating. Do not drink alcohol or take other sedating medications when taking this medicine for safety reasons.  Keep this out of reach of small children.    We have prescribed you new medication(s) today. Discuss the medications prescribed today with your pharmacist as they can have adverse effects and interactions with your other medicines including over the counter and prescribed medications. Seek medical evaluation if you start to experience new or abnormal symptoms after taking one of these medicines, seek care immediately if you start to experience difficulty breathing, feeling of your throat closing, facial swelling, or rash as these could be indications of a more serious allergic reaction  Follow-up instructions: Please follow-up with the orthopedic surgeon in your discharge instructions - you can see Dr. Sharol Given whom you've seen previously or Dr. Nona Dell office- each of their information is provided.   Return instructions:  Please return if your digits or extremity are numb or tingling, appear gray or blue, or you have severe pain (also elevate the extremity and loosen splint or wrap if you were given one) Please return if you have redness or fevers.  Please return to the Emergency Department if you experience worsening symptoms.  Please return if you have any other emergent concerns. Additional Information:  Your vital signs today were: BP 127/64   Pulse (!) 55   Temp 98.6 F (37 C) (Oral)   Resp (!) 26   Ht 5\' 7"  (1.702 m)   Wt  99.8 kg   LMP 08/23/2012   SpO2 97%   BMI 34.46 kg/m  If your blood pressure (BP) was elevated above 135/85 this visit, please have this repeated by your doctor within one month. ---------------

## 2019-11-16 NOTE — ED Notes (Signed)
Transport to Xray

## 2019-11-16 NOTE — ED Provider Notes (Signed)
[facility] EMERGENCY DEPARTMENT Provider Note   CSN: [id number] Arrival date & time: 11/16/19  1003     History Chief Complaint  Patient presents with   Lytle Michaels    Jamie Jenkins is a 59 y.o. female with a history of hypertension and prior tubal ligation who presents to the emergency department via EMS status post mechanical fall with complaints of right ankle pain.  Patient states that she was ambulating when she slipped on a wet floor and fell causing an injury to her right ankle.  Denies head injury or loss of consciousness.  She states she is only having pain to the right ankle with associated swelling. Worse with any type of movement, no alleviating factors, no intervention PTA.  Patient denies numbness, tingling, or other areas of injury.   HPI     Past Medical History:  Diagnosis Date   DDD (degenerative disc disease), lumbar    Gall stones    Hypertension     Patient Active Problem List   Diagnosis Date Noted   Routine general medical examination at a health care facility 02/13/2019   Gastroesophageal reflux disease without esophagitis 02/13/2019   Screening for cervical cancer 02/13/2019   Visit for screening mammogram 02/13/2019   Hyperglycemia 10/05/2017   Vitamin D deficiency disease 10/05/2017   Obesity 06/08/2016   DDD (degenerative disc disease), lumbar 05/21/2014   Essential hypertension 05/21/2014    Past Surgical History:  Procedure Laterality Date   ANKLE FUSION Left 07/21/2017   Procedure: SUBTALAR AND TALONAVICULAR FUSION LEFT FOOT;  Surgeon: Newt Minion, MD;  Location: [place];  Service: Orthopedics;  Laterality: Left;   AXILLARY LYMPH NODE DISSECTION Left 11/24/2012   Procedure: incision and drainage axillary abscess;  Surgeon: Adin Hector, MD;  Location: [place];  Service: General;  Laterality: Left;   BREAST SURGERY  2012   breast biopsy   CHOLECYSTECTOMY     TUBAL LIGATION       OB History   No  obstetric history on file.     Family History  Problem Relation Age of Onset   Pancreatic cancer Mother        Deceased   Hypertension Father    Deep vein thrombosis Brother        Deceased   Healthy Sister    Healthy Son    Healthy Daughter     Social History   Tobacco Use   Smoking status: Never Smoker   Smokeless tobacco: Never Used  Scientific laboratory technician Use: Never used  Substance Use Topics   Alcohol use: Yes    Alcohol/week: 0.0 standard drinks   Drug use: No    Home Medications Prior to Admission medications   Medication Sig Start Date End Date Taking? Authorizing Provider  Cholecalciferol 1.25 MG (50000 UT) capsule Take 1 capsule (50,000 Units total) by mouth once a week. 02/14/19   Janith Lima, MD  esomeprazole (NEXIUM) 40 MG capsule Take 1 capsule (40 mg total) by mouth daily at 12 noon. 02/13/19   Janith Lima, MD  gabapentin (NEURONTIN) 300 MG capsule Take 1 capsule (300 mg total) by mouth 3 (three) times daily. 3 times a day when necessary neuropathy pain 10/04/17   Newt Minion, MD  hydrochlorothiazide (HYDRODIURIL) 25 MG tablet TAKE 1 TABLET(25 MG) BY MOUTH DAILY 02/22/19   Janith Lima, MD    Allergies    Patient has no known allergies.  Review of Systems  Review of Systems  Constitutional: Negative for chills and fever.  Respiratory: Negative for shortness of breath.   Cardiovascular: Negative for chest pain.  Gastrointestinal: Negative for abdominal pain.  Musculoskeletal: Positive for arthralgias and joint swelling. Negative for back pain and neck pain.  Neurological: Negative for syncope, weakness, numbness and headaches.  All other systems reviewed and are negative.   Physical Exam Updated Vital Signs BP 135/90 (BP Location: Left Arm)    Pulse 69    Temp 98.6 F (37 C) (Oral)    Resp 17    Ht 5\' 7"  (1.702 m)    Wt 99.8 kg    LMP 08/23/2012    SpO2 100%    BMI 34.46 kg/m   Physical Exam Vitals and nursing note reviewed.   Constitutional:      General: She is in acute distress (appears very uncomfortable, tearful).     Appearance: She is well-developed. She is not toxic-appearing.  HENT:     Head: Normocephalic and atraumatic.     Comments: No raccoon eyes or battle sign. Eyes:     General:        Right eye: No discharge.        Left eye: No discharge.     Conjunctiva/sclera: Conjunctivae normal.     Pupils: Pupils are equal, round, and reactive to light.  Neck:     Comments: No midline spinal tenderness. Cardiovascular:     Rate and Rhythm: Normal rate and regular rhythm.     Comments: 2+ symmetric DP pulses bilaterally.  Pulmonary:     Effort: Pulmonary effort is normal. No respiratory distress.     Breath sounds: Normal breath sounds. No wheezing, rhonchi or rales.  Chest:     Chest wall: No tenderness.  Abdominal:     General: There is no distension.     Palpations: Abdomen is soft.     Tenderness: There is no abdominal tenderness. There is no guarding or rebound.  Musculoskeletal:     Cervical back: Normal range of motion and neck supple.     Comments: Upper extremities: No focal bony tenderness Back: No midline tenderness Lower extremities: Patient has an obvious deformity to the right ankle with swelling. There are no open wounds. Healed scar to L ankle.  She has intact AROM throughout the LLE, RLE limited secondary to ankle pain, able to move at the hip/knee some and the toes. Tender over the R fibular head, diffuse ankle, and proximal mid foot. Otherwise nontender.   Skin:    General: Skin is warm and dry.     Findings: No rash.  Neurological:     Mental Status: She is alert.     Comments: Clear speech. Sensation grossly intact to bilateral lower extremities, able to move all digits.   Psychiatric:        Behavior: Behavior normal.    ED Results / Procedures / Treatments   Labs (all labs ordered are listed, but only abnormal results are displayed) Labs Reviewed - No data to  display  EKG None  Radiology DG Tibia/Fibula Right  Result Date: 11/16/2019 CLINICAL DATA:  Right ankle pain after fall EXAM: RIGHT TIBIA AND FIBULA - 2 VIEW; RIGHT ANKLE - COMPLETE 3+ VIEW COMPARISON:  None. FINDINGS: Acute obliquely oriented fracture of the distal fibular metaphysis with up to 8 mm of lateral displacement. Mildly displaced avulsion fracture from the inferior tip of the medial malleolus. Vertically oriented fracture of the posterior malleolus with up to  7 mm of displacement. There is widening of the medial clear space. The talus is posterolaterally subluxed relative to the tibial plafond. Pes planus alignment. Dedicated views of the proximal tibia and fibula reveal no additional fracture. No knee joint effusion. There is diffuse soft tissue swelling at the fracture site. IMPRESSION: Mildly displaced trimalleolar fracture of the right ankle with posterolateral subluxation of the talus relative to the distal tibia. Electronically Signed   By: Davina Poke D.O.   On: 11/16/2019 10:54   DG Ankle Complete Right  Result Date: 11/16/2019 CLINICAL DATA:  Right ankle pain after fall EXAM: RIGHT TIBIA AND FIBULA - 2 VIEW; RIGHT ANKLE - COMPLETE 3+ VIEW COMPARISON:  None. FINDINGS: Acute obliquely oriented fracture of the distal fibular metaphysis with up to 8 mm of lateral displacement. Mildly displaced avulsion fracture from the inferior tip of the medial malleolus. Vertically oriented fracture of the posterior malleolus with up to 7 mm of displacement. There is widening of the medial clear space. The talus is posterolaterally subluxed relative to the tibial plafond. Pes planus alignment. Dedicated views of the proximal tibia and fibula reveal no additional fracture. No knee joint effusion. There is diffuse soft tissue swelling at the fracture site. IMPRESSION: Mildly displaced trimalleolar fracture of the right ankle with posterolateral subluxation of the talus relative to the distal tibia.  Electronically Signed   By: Davina Poke D.O.   On: 11/16/2019 10:54    Procedures Reduction of fracture  Date/Time: 11/16/2019 12:04 PM Performed by: Amaryllis Dyke, PA-C Authorized by: Amaryllis Dyke, PA-C  Consent: Written consent obtained. Risks and benefits: risks, benefits and alternatives were discussed Consent given by: patient Patient understanding: patient states understanding of the procedure being performed Patient consent: the patient's understanding of the procedure matches consent given Procedure consent: procedure consent matches procedure scheduled Relevant documents: relevant documents present and verified Test results: test results available and properly labeled Site marked: the operative site was marked Imaging studies: imaging studies available Required items: required blood products, implants, devices, and special equipment available Patient identity confirmed: verbally with patient Time out: Immediately prior to procedure a "time out" was called to verify the correct patient, procedure, equipment, support staff and site/side marked as required. Local anesthesia used: no  Anesthesia: Local anesthesia used: no  Sedation: Patient sedated: yes Sedatives: etomidate and midazolam Sedation start date/time: 11/16/2019 11:45 AM Sedation end date/time: 11/16/2019 12:00 PM Vitals: Vital signs were monitored during sedation.  Patient tolerance: patient tolerated the procedure well with no immediate complications     SPLINT APPLICATION Date/Time: 32:20 PM Authorized by: Kennith Maes Consent: Verbal consent obtained. Risks and benefits: risks, benefits and alternatives were discussed Consent given by: patient Splint applied by: orthopedic technician Location details: RLE Splint type: short leg posterior w/ stirrup Supplies used: ortho glass.  Post-procedure: The splinted body part was neurovascularly unchanged following the  procedure. Patient tolerance: Patient tolerated the procedure well with no immediate complications.  (including critical care time)  Medications Ordered in ED Medications  ondansetron (ZOFRAN) injection 4 mg (4 mg Intravenous Given 11/16/19 1018)  morphine 4 MG/ML injection 4 mg (4 mg Intravenous Given 11/16/19 1018)    ED Course  I have reviewed the triage vital signs and the nursing notes.  Pertinent labs & imaging results that were available during my care of the patient were reviewed by me and considered in my medical decision making (see chart for details).    MDM Rules/Calculators/A&P  Patient presents to the ED with complaints of R ankle pain S/p mechanical fall.  Right ankle with obvious deformity, 2+ DP pulse present, sensation grossly intact.  Patient appears uncomfortable, vitals w/o significant abnormality, no other obvious injuries.   Additional history obtained:  Additional history obtained from [place]. Previous records obtained and reviewed.   Following initial evaluation Morphine ordered for pain control w/ zofran for possible subsequent nausea.  11:08: RE-EVAL: No relief in pain, will trial fentanyl.   Imaging Studies ordered:  I ordered imaging studies which included R tib/fib, ankle, and foot xrays, I independently visualized and interpreted imaging which showed mildly displaced trimalleolar fracture of the right ankle with posterolateral subluxation of the talus relative to the distal tibia.  11:17: CONSULT: Discussed with Hilbert Odor, PA-C with orthopedic surgery, in agreement with plan for reduction, repeat x-ray, and subsequent CT of the ankle.  Conscious sedation and reduction performed supervising physician Dr. Gilford Raid at the bedside per procedure note above.  Patient neurovascularly intact prior to and following reduction and splinting, x-ray with improved alignment. CT obtained, re-discussed w/ orthopedics team- do not need to wait  for results, NWB status with close follow up next week in clinic. Will discharge home with percocet and a few tablets of valium per discussion with Dr. Gilford Raid. I discussed results, treatment plan, need for follow-up, and return precautions with the patient & her husband @ bedside. Provided opportunity for questions, patient & her husband confirmed understanding and are in agreement with plan.   This is a shared visit with supervising physician Dr. Gilford Raid who has independently evaluated patient & provided guidance in evaluation/management/disposition, in agreement with care   Portions of this note were generated with Dragon dictation software. Dictation errors may occur despite best attempts at proofreading.  Final Clinical Impression(s) / ED Diagnoses Closed trimalleolar fracture of right ankle Fall  Rx / DC Orders ED Discharge Orders         Ordered    oxyCODONE-acetaminophen (PERCOCET/ROXICET) 5-325 MG tablet  Every 6 hours PRN        11/16/19 1454    diazepam (VALIUM) 5 MG tablet  2 times daily PRN        11/16/19 1454           Baleria Wyman, [place] R, PA-C 11/16/19 1457    Isla Pence, MD 11/16/19 1512

## 2019-11-16 NOTE — Progress Notes (Signed)
Orthopedic Tech Progress Note Patient Details:  Jamie Jenkins 1960/04/21 [id number]  Ortho Devices Type of Ortho Device: Short leg splint, Stirrup splint Ortho Device/Splint Location: LLE Ortho Device/Splint Interventions: Application, Ordered   Post Interventions Patient Tolerated: Well   [name] [name] 11/16/2019, 12:28 PM

## 2019-11-16 NOTE — ED Triage Notes (Signed)
Patient states she was at work and slipped in some water and fell obv. To right ankle, positive right pedal pulse

## 2019-11-17 ENCOUNTER — Ambulatory Visit: Payer: BC Managed Care – PPO | Admitting: Orthopedic Surgery

## 2019-11-17 ENCOUNTER — Encounter: Payer: Self-pay | Admitting: Orthopedic Surgery

## 2019-11-17 ENCOUNTER — Telehealth: Payer: Self-pay

## 2019-11-17 DIAGNOSIS — S82851A Displaced trimalleolar fracture of right lower leg, initial encounter for closed fracture: Secondary | ICD-10-CM | POA: Diagnosis not present

## 2019-11-17 NOTE — Telephone Encounter (Signed)
I called and lm on vm to advise pt that we would like to work her in on the schedule today at 1:00 for eval of ankle fracture. Will hold message and continue to try and reach pt.

## 2019-11-17 NOTE — Telephone Encounter (Signed)
Today at around 1- 1:30

## 2019-11-17 NOTE — Progress Notes (Signed)
Office Visit Note   Patient: Jamie Jenkins           Date of Birth: [birth year]           MRN: [id number] Visit Date: 11/17/2019              Requested by: Janith Lima, MD [address] [place],  [place] [address] PCP: Janith Lima, MD  Chief Complaint  Patient presents with  . Right Ankle - Injury    DOI 11/16/19 fell at work ER xrays ankle fx       HPI: Patient is a 59 year old woman who is seen for initial evaluation for a right ankle trimalleolar fracture.  Patient states she slipped on water at work was seen in the emergency room underwent closed reduction and splinting.  Patient presents complaining of pain and swelling.  Assessment & Plan: Visit Diagnoses:  1. Closed trimalleolar fracture of right ankle, initial encounter     Plan: Discussed recommendations to proceed with open reduction internal fixation.  We will plan for a plate over the fibula, screws across the medial malleolus the posterior malleolar fragment appears to be outside the articular cartilage and anticipate this should reduce with the reduction of the fibula and should not need internal fixation.  Risks and benefits of surgery were discussed patient states she understands she states she would like to proceed with surgery on Wednesday we will set this up as outpatient surgery.    Patient anticipates out of work for 3 months.  Follow-Up Instructions: Return in about 2 weeks (around 12/01/2019).   Ortho Exam  Patient is alert, oriented, no adenopathy, well-dressed, normal affect, normal respiratory effort. Examination patient has a good dorsalis pedis pulse there is swelling but no blistering of the skin.  She can wiggle her toes sensation is intact calf is soft no evidence of compartment syndrome.  The x-rays and CT scan were reviewed.  Patient has a long oblique Weber B fibular fracture with a comminuted posterior malleolar fragment which is outside the articular cartilage.  There is also a long  oblique medial malleolar fracture that is nondisplaced.  Imaging: No results found. No images are attached to the encounter.  Labs: Lab Results  Component Value Date   HGBA1C 5.5 02/13/2019   HGBA1C 5.6 10/05/2017   HGBA1C 5.5 06/23/2016     Lab Results  Component Value Date   ALBUMIN 3.7 10/05/2017   ALBUMIN 3.9 07/16/2017   ALBUMIN 4.3 06/23/2016    No results found for: MG Lab Results  Component Value Date   VD25OH 20.39 (L) 02/13/2019   VD25OH 7.43 (L) 10/05/2017   VD25OH 15.87 (L) 06/23/2016    No results found for: PREALBUMIN CBC EXTENDED Latest Ref Rng & Units 02/13/2019 10/05/2017 07/16/2017  WBC 4.0 - 10.5 K/uL 6.6 5.7 8.8  RBC 3.87 - 5.11 Mil/uL 4.49 3.94 4.19  HGB 12.0 - 15.0 g/dL 14.6 13.3 14.2  HCT 36 - 46 % 43.2 39.6 41.5  PLT 150 - 400 K/uL 249.0 238.0 272  NEUTROABS 1.4 - 7.7 K/uL 3.6 2.9 -  LYMPHSABS 0.7 - 4.0 K/uL 2.5 2.4 -     There is no height or weight on file to calculate BMI.  Orders:  No orders of the defined types were placed in this encounter.  No orders of the defined types were placed in this encounter.    Procedures: No procedures performed  Clinical Data: No additional findings.  ROS:  All other  systems negative, except as noted in the HPI. Review of Systems  Objective: Vital Signs: LMP 08/23/2012   Specialty Comments:  No specialty comments available.  PMFS History: Patient Active Problem List   Diagnosis Date Noted  . Routine general medical examination at a health care facility 02/13/2019  . Gastroesophageal reflux disease without esophagitis 02/13/2019  . Screening for cervical cancer 02/13/2019  . Visit for screening mammogram 02/13/2019  . Hyperglycemia 10/05/2017  . Vitamin D deficiency disease 10/05/2017  . Obesity 06/08/2016  . DDD (degenerative disc disease), lumbar 05/21/2014  . Essential hypertension 05/21/2014   Past Medical History:  Diagnosis Date  . DDD (degenerative disc disease), lumbar     . Gall stones   . Hypertension     Family History  Problem Relation Age of Onset  . Pancreatic cancer Mother        Deceased  . Hypertension Father   . Deep vein thrombosis Brother        Deceased  . Healthy Sister   . Healthy Son   . Healthy Daughter     Past Surgical History:  Procedure Laterality Date  . ANKLE FUSION Left 07/21/2017   Procedure: SUBTALAR AND TALONAVICULAR FUSION LEFT FOOT;  Surgeon: Newt Minion, MD;  Location: [place];  Service: Orthopedics;  Laterality: Left;  . AXILLARY LYMPH NODE DISSECTION Left 11/24/2012   Procedure: incision and drainage axillary abscess;  Surgeon: Adin Hector, MD;  Location: [facility];  Service: General;  Laterality: Left;  . BREAST SURGERY  2012   breast biopsy  . CHOLECYSTECTOMY    . TUBAL LIGATION     Social History   Occupational History  . Occupation: Scientist, research (medical)  Tobacco Use  . Smoking status: Never Smoker  . Smokeless tobacco: Never Used  Vaping Use  . Vaping Use: Never used  Substance and Sexual Activity  . Alcohol use: Yes    Alcohol/week: 0.0 standard drinks  . Drug use: No  . Sexual activity: Not on file

## 2019-11-18 ENCOUNTER — Other Ambulatory Visit (HOSPITAL_COMMUNITY)
Admission: RE | Admit: 2019-11-18 | Discharge: 2019-11-18 | Disposition: A | Discharge status: Home / Self Care | Payer: No Typology Code available for payment source | Source: Ambulatory Visit | Attending: Orthopedic Surgery | Admitting: Orthopedic Surgery

## 2019-11-18 DIAGNOSIS — Z20822 Contact with and (suspected) exposure to covid-19: Secondary | ICD-10-CM | POA: Diagnosis not present

## 2019-11-18 DIAGNOSIS — Z01812 Encounter for preprocedural laboratory examination: Secondary | ICD-10-CM | POA: Insufficient documentation

## 2019-11-18 LAB — SARS CORONAVIRUS 2 (TAT 6-24 HRS): SARS Coronavirus 2: NEGATIVE

## 2019-11-21 ENCOUNTER — Encounter (HOSPITAL_COMMUNITY): Payer: Self-pay | Admitting: Orthopedic Surgery

## 2019-11-21 ENCOUNTER — Other Ambulatory Visit: Payer: Self-pay

## 2019-11-21 ENCOUNTER — Other Ambulatory Visit: Payer: Self-pay | Admitting: Physician Assistant

## 2019-11-21 NOTE — Progress Notes (Signed)
Pt denies SOB, chest pain, and being under the care of a cardiologist. Pt stated that PCP is Dr. Scarlette Calico. Pt denies having a stress test and cardiac cath. Pt denies recent labs. Pt stated that she does not take Aspirin. Pt made aware to stop taking vitamins, fish oil and herbal medications. Do not take any NSAIDs ie: Ibuprofen, Advil, Naproxen (Aleve), Motrin, BC and Goody Powder. Pt reminded to continue to quarantine. Pt verbalized understanding of all pre-op instructions.

## 2019-11-21 NOTE — Anesthesia Preprocedure Evaluation (Addendum)
Anesthesia Evaluation  Patient identified by MRN, date of birth, ID band Patient awake    Reviewed: Allergy & Precautions, NPO status , Patient's Chart, lab work & pertinent test results  History of Anesthesia Complications Negative for: history of anesthetic complications  Airway Mallampati: II  TM Distance: >3 FB Neck ROM: Full    Dental no notable dental hx. (+) Dental Advisory Given   Pulmonary neg pulmonary ROS,    Pulmonary exam normal        Cardiovascular hypertension, Pt. on medications Normal cardiovascular exam     Neuro/Psych negative neurological ROS     GI/Hepatic Neg liver ROS, GERD  ,  Endo/Other  negative endocrine ROS  Renal/GU negative Renal ROS     Musculoskeletal negative musculoskeletal ROS (+)   Abdominal   Peds  Hematology negative hematology ROS (+)   Anesthesia Other Findings   Reproductive/Obstetrics                            Anesthesia Physical Anesthesia Plan  ASA: III  Anesthesia Plan: General   Post-op Pain Management:  Regional for Post-op pain   Induction: Intravenous  PONV Risk Score and Plan: 3 and Ondansetron, Dexamethasone and Midazolam  Airway Management Planned: LMA  Additional Equipment:   Intra-op Plan:   Post-operative Plan: Extubation in OR  Informed Consent: I have reviewed the patients History and Physical, chart, labs and discussed the procedure including the risks, benefits and alternatives for the proposed anesthesia with the patient or authorized representative who has indicated his/her understanding and acceptance.     Dental advisory given  Plan Discussed with: Anesthesiologist and CRNA  Anesthesia Plan Comments:        Anesthesia Quick Evaluation

## 2019-11-21 NOTE — Progress Notes (Signed)
Pt denies having an EKG and chest x ray in the last year.

## 2019-11-22 ENCOUNTER — Ambulatory Visit: Payer: BC Managed Care – PPO | Admitting: Family

## 2019-11-22 ENCOUNTER — Encounter (HOSPITAL_COMMUNITY): Payer: Self-pay | Admitting: Orthopedic Surgery

## 2019-11-22 ENCOUNTER — Encounter (HOSPITAL_COMMUNITY)
Admission: RE | Disposition: A | Discharge status: Home / Self Care | Payer: Self-pay | Source: Home / Self Care | Attending: Orthopedic Surgery

## 2019-11-22 ENCOUNTER — Ambulatory Visit (HOSPITAL_COMMUNITY): Payer: No Typology Code available for payment source | Admitting: Anesthesiology

## 2019-11-22 ENCOUNTER — Ambulatory Visit (HOSPITAL_COMMUNITY)
Admission: RE | Admit: 2019-11-22 | Discharge: 2019-11-22 | Disposition: A | Discharge status: Home / Self Care | Payer: No Typology Code available for payment source | Attending: Orthopedic Surgery | Admitting: Orthopedic Surgery

## 2019-11-22 ENCOUNTER — Ambulatory Visit (HOSPITAL_COMMUNITY): Payer: No Typology Code available for payment source

## 2019-11-22 DIAGNOSIS — E559 Vitamin D deficiency, unspecified: Secondary | ICD-10-CM | POA: Diagnosis not present

## 2019-11-22 DIAGNOSIS — S82851A Displaced trimalleolar fracture of right lower leg, initial encounter for closed fracture: Secondary | ICD-10-CM | POA: Diagnosis not present

## 2019-11-22 DIAGNOSIS — S82852A Displaced trimalleolar fracture of left lower leg, initial encounter for closed fracture: Secondary | ICD-10-CM

## 2019-11-22 DIAGNOSIS — I10 Essential (primary) hypertension: Secondary | ICD-10-CM | POA: Insufficient documentation

## 2019-11-22 DIAGNOSIS — G8918 Other acute postprocedural pain: Secondary | ICD-10-CM | POA: Diagnosis not present

## 2019-11-22 DIAGNOSIS — W010XXA Fall on same level from slipping, tripping and stumbling without subsequent striking against object, initial encounter: Secondary | ICD-10-CM | POA: Insufficient documentation

## 2019-11-22 HISTORY — DX: Other fracture of unspecified lower leg, initial encounter for closed fracture: S82.899A

## 2019-11-22 HISTORY — PX: ORIF ANKLE FRACTURE: SHX5408

## 2019-11-22 LAB — CBC
HCT: 44.6 % (ref 36.0–46.0)
Hemoglobin: 14.8 g/dL (ref 12.0–15.0)
MCH: 33.8 pg (ref 26.0–34.0)
MCHC: 33.2 g/dL (ref 30.0–36.0)
MCV: 101.8 fL — ABNORMAL HIGH (ref 80.0–100.0)
Platelets: 251 10*3/uL (ref 150–400)
RBC: 4.38 MIL/uL (ref 3.87–5.11)
RDW: 12.9 % (ref 11.5–15.5)
WBC: 7.5 10*3/uL (ref 4.0–10.5)
nRBC: 0 % (ref 0.0–0.2)

## 2019-11-22 LAB — BASIC METABOLIC PANEL
Anion gap: 9 (ref 5–15)
BUN: 6 mg/dL (ref 6–20)
CO2: 27 mmol/L (ref 22–32)
Calcium: 9.3 mg/dL (ref 8.9–10.3)
Chloride: 104 mmol/L (ref 98–111)
Creatinine, Ser: 0.82 mg/dL (ref 0.44–1.00)
GFR calc Af Amer: >60 mL/min (ref >60)
GFR calc non Af Amer: >60 mL/min (ref >60)
Glucose, Bld: 96 mg/dL (ref 70–99)
Potassium: 3.6 mmol/L (ref 3.5–5.1)
Sodium: 140 mmol/L (ref 135–145)

## 2019-11-22 LAB — SURGICAL PCR SCREEN
MRSA, PCR: NEGATIVE
Staphylococcus aureus: NEGATIVE

## 2019-11-22 SURGERY — OPEN REDUCTION INTERNAL FIXATION (ORIF) ANKLE FRACTURE
Anesthesia: General | Site: Ankle | Laterality: Right

## 2019-11-22 MED ORDER — CEFAZOLIN SODIUM-DEXTROSE 2-4 GM/100ML-% IV SOLN
2.0000 g | INTRAVENOUS | Status: AC
  Administered 2019-11-22: 2 g via INTRAVENOUS

## 2019-11-22 MED ORDER — FENTANYL CITRATE (PF) 100 MCG/2ML IJ SOLN: 25.0000 ug | INTRAMUSCULAR | Status: DC | PRN

## 2019-11-22 MED ORDER — FENTANYL CITRATE (PF) 100 MCG/2ML IJ SOLN
INTRAMUSCULAR | Status: AC
  Administered 2019-11-22: 100 ug via INTRAVENOUS
  Filled 2019-11-22: qty 2

## 2019-11-22 MED ORDER — ACETAMINOPHEN 500 MG PO TABS
ORAL_TABLET | ORAL | Status: AC
  Administered 2019-11-22: 1000 mg via ORAL
  Filled 2019-11-22: qty 2

## 2019-11-22 MED ORDER — ONDANSETRON HCL 4 MG/2ML IJ SOLN
INTRAMUSCULAR | Status: DC | PRN
  Administered 2019-11-22: 4 mg via INTRAVENOUS

## 2019-11-22 MED ORDER — FENTANYL CITRATE (PF) 250 MCG/5ML IJ SOLN
INTRAMUSCULAR | Status: AC
  Filled 2019-11-22: qty 5

## 2019-11-22 MED ORDER — FENTANYL CITRATE (PF) 100 MCG/2ML IJ SOLN: 100.0000 ug | Freq: Once | INTRAMUSCULAR | Status: AC

## 2019-11-22 MED ORDER — MIDAZOLAM HCL 2 MG/2ML IJ SOLN: 2.0000 mg | Freq: Once | INTRAMUSCULAR | Status: AC

## 2019-11-22 MED ORDER — ORAL CARE MOUTH RINSE: 15.0000 mL | Freq: Once | OROMUCOSAL | Status: AC

## 2019-11-22 MED ORDER — PROPOFOL 10 MG/ML IV BOLUS
INTRAVENOUS | Status: DC | PRN
  Administered 2019-11-22: 200 mg via INTRAVENOUS

## 2019-11-22 MED ORDER — CHLORHEXIDINE GLUCONATE 0.12 % MT SOLN
OROMUCOSAL | Status: AC
  Administered 2019-11-22: 15 mL via OROMUCOSAL
  Filled 2019-11-22: qty 15

## 2019-11-22 MED ORDER — CEFAZOLIN SODIUM-DEXTROSE 2-4 GM/100ML-% IV SOLN
INTRAVENOUS | Status: AC
  Filled 2019-11-22: qty 100

## 2019-11-22 MED ORDER — PROMETHAZINE HCL 25 MG/ML IJ SOLN: 6.2500 mg | INTRAMUSCULAR | Status: DC | PRN

## 2019-11-22 MED ORDER — MIDAZOLAM HCL 2 MG/2ML IJ SOLN
INTRAMUSCULAR | Status: AC
  Filled 2019-11-22: qty 2

## 2019-11-22 MED ORDER — LIDOCAINE 2% (20 MG/ML) 5 ML SYRINGE
INTRAMUSCULAR | Status: DC | PRN
  Administered 2019-11-22: 100 mg via INTRAVENOUS

## 2019-11-22 MED ORDER — MIDAZOLAM HCL 2 MG/2ML IJ SOLN
INTRAMUSCULAR | Status: AC
  Administered 2019-11-22: 2 mg via INTRAVENOUS
  Filled 2019-11-22: qty 2

## 2019-11-22 MED ORDER — CELECOXIB 200 MG PO CAPS: 200.0000 mg | ORAL_CAPSULE | Freq: Once | ORAL | Status: AC

## 2019-11-22 MED ORDER — BUPIVACAINE HCL (PF) 0.5 % IJ SOLN
INTRAMUSCULAR | Status: DC | PRN
  Administered 2019-11-22 (×2): 15 mL via PERINEURAL

## 2019-11-22 MED ORDER — BUPIVACAINE HCL (PF) 0.5 % IJ SOLN: INTRAMUSCULAR | Status: DC | PRN

## 2019-11-22 MED ORDER — ASPIRIN EC 325 MG PO TBEC: 325.0000 mg | DELAYED_RELEASE_TABLET | Freq: Every day | ORAL | 0 refills | Status: DC

## 2019-11-22 MED ORDER — DEXAMETHASONE SODIUM PHOSPHATE 10 MG/ML IJ SOLN
INTRAMUSCULAR | Status: DC | PRN
  Administered 2019-11-22 (×2): 10 mg via INTRAVENOUS

## 2019-11-22 MED ORDER — BUPIVACAINE LIPOSOME 1.3 % IJ SUSP
INTRAMUSCULAR | Status: DC | PRN
  Administered 2019-11-22: 10 mL via PERINEURAL

## 2019-11-22 MED ORDER — EPHEDRINE SULFATE-NACL 50-0.9 MG/10ML-% IV SOSY
PREFILLED_SYRINGE | INTRAVENOUS | Status: DC | PRN
  Administered 2019-11-22 (×3): 10 mg via INTRAVENOUS

## 2019-11-22 MED ORDER — OXYCODONE-ACETAMINOPHEN 5-325 MG PO TABS: 1.0000 | ORAL_TABLET | ORAL | 0 refills | Status: DC | PRN

## 2019-11-22 MED ORDER — LACTATED RINGERS IV SOLN: INTRAVENOUS | Status: DC

## 2019-11-22 MED ORDER — CHLORHEXIDINE GLUCONATE 0.12 % MT SOLN: 15.0000 mL | Freq: Once | OROMUCOSAL | Status: AC

## 2019-11-22 MED ORDER — ACETAMINOPHEN 500 MG PO TABS: 1000.0000 mg | ORAL_TABLET | Freq: Once | ORAL | Status: AC

## 2019-11-22 MED ORDER — FENTANYL CITRATE (PF) 250 MCG/5ML IJ SOLN
INTRAMUSCULAR | Status: DC | PRN
Start: 2019-11-22 — End: 2019-11-22
  Administered 2019-11-22: 50 ug via INTRAVENOUS

## 2019-11-22 MED ORDER — 0.9 % SODIUM CHLORIDE (POUR BTL) OPTIME
TOPICAL | Status: DC | PRN
  Administered 2019-11-22: 1000 mL

## 2019-11-22 MED ORDER — CELECOXIB 200 MG PO CAPS
ORAL_CAPSULE | ORAL | Status: AC
  Administered 2019-11-22: 200 mg via ORAL
  Filled 2019-11-22: qty 1

## 2019-11-22 SURGICAL SUPPLY — 50 items
BANDAGE ESMARK 6X9 LF (GAUZE/BANDAGES/DRESSINGS) IMPLANT
BIT DRILL 110X2.5XQCK CNCT (BIT) ×1 IMPLANT
BIT DRILL 2.5 (BIT) ×2
BIT DRL 110X2.5XQCK CNCT (BIT) ×1
BNDG COHESIVE 4X5 TAN STRL (GAUZE/BANDAGES/DRESSINGS) ×2 IMPLANT
BNDG ESMARK 6X9 LF (GAUZE/BANDAGES/DRESSINGS)
BNDG GAUZE ELAST 4 BULKY (GAUZE/BANDAGES/DRESSINGS) ×2 IMPLANT
COVER SURGICAL LIGHT HANDLE (MISCELLANEOUS) ×2 IMPLANT
COVER WAND RF STERILE (DRAPES) ×2 IMPLANT
DRAPE OEC MINIVIEW 54X84 (DRAPES) IMPLANT
DRAPE U-SHAPE 47X51 STRL (DRAPES) ×2 IMPLANT
DRSG ADAPTIC 3X8 NADH LF (GAUZE/BANDAGES/DRESSINGS) ×2 IMPLANT
DRSG KUZMA FLUFF (GAUZE/BANDAGES/DRESSINGS) ×2 IMPLANT
DRSG PAD ABDOMINAL 8X10 ST (GAUZE/BANDAGES/DRESSINGS) ×2 IMPLANT
DURAPREP 26ML APPLICATOR (WOUND CARE) ×2 IMPLANT
ELECT REM PT RETURN 9FT ADLT (ELECTROSURGICAL) ×2
ELECTRODE REM PT RTRN 9FT ADLT (ELECTROSURGICAL) ×1 IMPLANT
GAUZE SPONGE 4X4 12PLY STRL (GAUZE/BANDAGES/DRESSINGS) ×2 IMPLANT
GLOVE BIOGEL PI IND STRL 9 (GLOVE) ×1 IMPLANT
GLOVE BIOGEL PI INDICATOR 9 (GLOVE) ×1
GLOVE SURG ORTHO 9.0 STRL STRW (GLOVE) ×2 IMPLANT
GOWN STRL REUS W/ TWL XL LVL3 (GOWN DISPOSABLE) ×3 IMPLANT
GOWN STRL REUS W/TWL XL LVL3 (GOWN DISPOSABLE) ×6
GUIDEWIRE PIN ORTH 6X1.6XSMTH (WIRE) ×2 IMPLANT
K-WIRE 1.6 (WIRE) ×4
KIT BASIN OR (CUSTOM PROCEDURE TRAY) ×2 IMPLANT
KIT TURNOVER KIT B (KITS) ×2 IMPLANT
MANIFOLD NEPTUNE II (INSTRUMENTS) ×2 IMPLANT
NS IRRIG 1000ML POUR BTL (IV SOLUTION) ×2 IMPLANT
PACK ORTHO EXTREMITY (CUSTOM PROCEDURE TRAY) ×2 IMPLANT
PAD ARMBOARD 7.5X6 YLW CONV (MISCELLANEOUS) ×4 IMPLANT
PLATE 7HOLE 1/3 TUBULAR (Plate) ×2 IMPLANT
SCREW CANN 1/3THD 46X4.0 (Screw) ×4 IMPLANT
SCREW CORT 2.5X20X3.5XST SM (Screw) ×1 IMPLANT
SCREW CORTICAL 3.5 16MM (Screw) ×2 IMPLANT
SCREW CORTICAL 3.5 18MM (Screw) ×2 IMPLANT
SCREW CORTICAL 3.5X12 (Screw) ×6 IMPLANT
SCREW CORTICAL 3.5X14 (Screw) ×2 IMPLANT
SCREW CORTICAL 3.5X20 (Screw) ×2 IMPLANT
SPONGE LAP 18X18 X RAY DECT (DISPOSABLE) ×2 IMPLANT
STAPLER VISISTAT 35W (STAPLE) IMPLANT
SUCTION FRAZIER HANDLE 10FR (MISCELLANEOUS) ×2
SUCTION TUBE FRAZIER 10FR DISP (MISCELLANEOUS) ×1 IMPLANT
SUT ETHILON 2 0 PSLX (SUTURE) IMPLANT
SUT VIC AB 2-0 CT1 27 (SUTURE) ×2
SUT VIC AB 2-0 CT1 TAPERPNT 27 (SUTURE) ×1 IMPLANT
SYR BULB IRRIG 60ML STRL (SYRINGE) ×2 IMPLANT
TOWEL GREEN STERILE (TOWEL DISPOSABLE) ×2 IMPLANT
TOWEL GREEN STERILE FF (TOWEL DISPOSABLE) ×2 IMPLANT
TUBE CONNECTING 12X1/4 (SUCTIONS) ×2 IMPLANT

## 2019-11-22 NOTE — Transfer of Care (Signed)
Immediate Anesthesia Transfer of Care Note  Patient: Jamie Jenkins  Procedure(s) Performed: OPEN REDUCTION INTERNAL FIXATION (ORIF) RIGHT ANKLE FRACTURE (Right Ankle)  Patient Location: PACU  Anesthesia Type:General and GA combined with regional for post-op pain  Level of Consciousness: drowsy and patient cooperative  Airway & Oxygen Therapy: Patient Spontanous Breathing  Post-op Assessment: Report given to RN, Post -op Vital signs reviewed and stable and Patient moving all extremities X 4  Post vital signs: Reviewed and stable  Last Vitals:  Vitals Value Taken Time  BP 109/65 11/22/19 1032  Temp 36.5 C 11/22/19 1032  Pulse 63 11/22/19 1034  Resp 16 11/22/19 1034  SpO2 99 % 11/22/19 1034  Vitals shown include unvalidated device data.  Last Pain:  Vitals:   11/22/19 0920  TempSrc:   PainSc: 0-No pain      Patients Stated Pain Goal: 3 (29/24/46 2863)  Complications: No complications documented.

## 2019-11-22 NOTE — Discharge Instructions (Signed)
Ankle Fracture  The ankle joint is made up of the lower (distal) sections of your lower leg bones(tibia and fibula) along with a bone in your foot (talus). An ankle fracture is a break in one, two, or all three of these sections of bone. Follow these instructions at home: If you have a splint:  Wear the splint as told by your doctor. Take it off only as told by your doctor.  Loosen the splint if your toes tingle, become numb, or turn cold and blue.  Keep the splint clean.  If the splint is not waterproof: ? Do not let it get wet. ? Cover it with a watertight covering when you take a bath or a shower. If you have a cast:  Do not stick anything inside the cast to scratch your skin. Doing that increases your risk of infection.  Check the skin around the cast every day. Tell your doctor about any concerns.  You may put lotion on dry skin around the edges of the cast. Do not put lotion on the skin underneath the cast.  Keep the cast clean.  If the cast is not waterproof: ? Do not let it get wet. ? Cover it with a watertight covering when you take a bath or a shower. Managing pain, stiffness, and swelling  If directed, put ice on the injured area: ? If you have a removable splint, remove it as told by your doctor. ? Put ice in a plastic bag. ? Place a towel between your skin and the bag. ? Leave the ice on for 20 minutes, 2-3 times a day.  Move your toes often. This prevents stiffness and lessens swelling.  Raise (elevate) the injured area above the level of your heart while you are sitting or lying down. General instructions  Do not use the injured limb to support your body weight until your doctor says that you can. Use crutches as told by your doctor.  Take over-the-counter and prescription medicines only as told by your doctor.  Ask your doctor when it is safe to drive if you have a cast or splint.  Do exercises as told by your doctor.  Do not use any products that  contain nicotine or tobacco, such as cigarettes and e-cigarettes. These can delay bone healing. If you need help quitting, ask your doctor.  Keep all follow-up visits as told by your doctor. This is important. Contact a doctor if:  Your pain or swelling gets worse.  Your pain or swelling does not get better when you rest or take medicine. Get help right away if:  Your cast gets damaged.  You continue to have very bad pain.  You have new pain or swelling.  Your skin or toes below the injured ankle: ? Turn blue or gray. ? Feel cold or numb. ? Lose sensitivity to touch. Summary  An ankle fracture is a break in one, two, or all three of the bones in your lower leg and lower foot.  If you have a splint, wear it as told by your health care provider. Keep it clean and dry.  If you have a cast, do not stick anything inside the cast to scratch your skin. This can cause infection.  Use ice, take medicines, raise your foot, and avoid tobacco and nicotine products. These steps will lessen pain and swelling and speed up healing. This information is not intended to replace advice given to you by your health care provider. Make sure you discuss   any questions you have with your health care provider. Document Revised: 02/12/2017 Document Reviewed: 04/06/2016 Elsevier Patient Education  2020 [employer].

## 2019-11-22 NOTE — H&P (Signed)
Jamie Jenkins is an 59 y.o. female.   Chief Complaint: Right Ankle Fracture HPI: Patient is a 59 year old woman who is seen for initial evaluation for a right ankle trimalleolar fracture.  Patient states she slipped on water at work was seen in the emergency room underwent closed reduction and splinting.  Patient presents complaining of pain and swelling.  Past Medical History:  Diagnosis Date   Ankle fracture    right   DDD (degenerative disc disease), lumbar    Gall stones    Hypertension     Past Surgical History:  Procedure Laterality Date   ANKLE FUSION Left 07/21/2017   Procedure: SUBTALAR AND TALONAVICULAR FUSION LEFT FOOT;  Surgeon: Newt Minion, MD;  Location: [facility];  Service: Orthopedics;  Laterality: Left;   AXILLARY LYMPH NODE DISSECTION Left 11/24/2012   Procedure: incision and drainage axillary abscess;  Surgeon: Adin Hector, MD;  Location: [facility];  Service: General;  Laterality: Left;   BREAST SURGERY  2012   breast biopsy   CHOLECYSTECTOMY     TUBAL LIGATION      Family History  Problem Relation Age of Onset   Pancreatic cancer Mother        Deceased   Hypertension Father    Deep vein thrombosis Brother        Deceased   Healthy Sister    Healthy Son    Healthy Daughter    Social History:  reports that she has never smoked. She has never used smokeless tobacco. She reports current alcohol use. She reports that she does not use drugs.  Allergies: No Known Allergies  No medications prior to admission.    No results found for this or any previous visit (from the past 48 hour(s)). No results found.  Review of Systems  All other systems reviewed and are negative.   Last menstrual period 08/23/2012. Physical Exam  Patient is alert, oriented, no adenopathy, well-dressed, normal affect, normal respiratory effort. Examination patient has a good dorsalis pedis pulse there is swelling but no blistering of the skin.  She can wiggle her  toes sensation is intact calf is soft no evidence of compartment syndrome.  The x-rays and CT scan were reviewed.  Patient has a long oblique Weber B fibular fracture with a comminuted posterior malleolar fragment which is outside the articular cartilage.  There is also a long oblique medial malleolar fracture that is nondisplaced.heart rrr lungs clr Assessment/Plan 1. Closed trimalleolar fracture of right ankle, initial encounter     Plan: Discussed recommendations to proceed with open reduction internal fixation.  We will plan for a plate over the fibula, screws across the medial malleolus the posterior malleolar fragment appears to be outside the articular cartilage and anticipate this should reduce with the reduction of the fibula and should not need internal fixation.  Risks and benefits of surgery were discussed patient states she understands she states she would like to proceed with surgery on Wednesday we will set this up as outpatient surgery.    Patient anticipates out of work for 3 months.   Jamie Palmer Nelia Rogoff, PA 11/22/2019, 6:37 AM

## 2019-11-22 NOTE — Anesthesia Procedure Notes (Signed)
Procedure Name: LMA Insertion Date/Time: 11/22/2019 9:46 AM Performed by: Darletta Moll, CRNA Pre-anesthesia Checklist: Patient identified, Emergency Drugs available, Suction available and Patient being monitored Patient Re-evaluated:Patient Re-evaluated prior to induction Oxygen Delivery Method: Circle system utilized Preoxygenation: Pre-oxygenation with 100% oxygen Induction Type: IV induction Ventilation: Mask ventilation without difficulty LMA: LMA flexible inserted LMA Size: 4.0 Number of attempts: 1 Placement Confirmation: positive ETCO2,  breath sounds checked- equal and bilateral and CO2 detector Tube secured with: Tape Dental Injury: Teeth and Oropharynx as per pre-operative assessment

## 2019-11-22 NOTE — Anesthesia Procedure Notes (Signed)
Anesthesia Regional Block: Popliteal block   Pre-Anesthetic Checklist: ,, timeout performed, Correct Patient, Correct Site, Correct Laterality, Correct Procedure, Correct Position, site marked, Risks and benefits discussed,  Surgical consent,  Pre-op evaluation,  At surgeon's request and post-op pain management  Laterality: Right  Prep: chloraprep       Needles:  Injection technique: Single-shot  Needle Type: Echogenic Stimulator Needle          Additional Needles:   Narrative:  Start time: 11/22/2019 9:02 AM End time: 11/22/2019 9:12 AM Injection made incrementally with aspirations every 5 mL.  Performed by: Personally  Anesthesiologist: Duane Boston, MD  Additional Notes: A functioning IV was confirmed and monitors were applied.  Sterile prep and drape, hand hygiene and sterile gloves were used.  Negative aspiration and test dose prior to incremental administration of local anesthetic. The patient tolerated the procedure well.Ultrasound  guidance: relevant anatomy identified, needle position confirmed, local anesthetic spread visualized around nerve(s), vascular puncture avoided.  Image printed for medical record.

## 2019-11-22 NOTE — Anesthesia Postprocedure Evaluation (Signed)
Anesthesia Post Note  Patient: Jamie Jenkins  Procedure(s) Performed: OPEN REDUCTION INTERNAL FIXATION (ORIF) RIGHT ANKLE FRACTURE (Right Ankle)     Patient location during evaluation: PACU Anesthesia Type: General Level of consciousness: sedated Pain management: pain level controlled Vital Signs Assessment: post-procedure vital signs reviewed and stable Respiratory status: spontaneous breathing and respiratory function stable Cardiovascular status: stable Postop Assessment: no apparent nausea or vomiting Anesthetic complications: no   No complications documented.  Last Vitals:  Vitals:   11/22/19 1045 11/22/19 1100  BP: 109/74 108/68  Pulse: 66 64  Resp: 13 15  Temp:  36.7 C  SpO2: 100% 100%    Last Pain:  Vitals:   11/22/19 1100  TempSrc:   PainSc: 0-No pain                 Jamie Jenkins

## 2019-11-22 NOTE — Op Note (Signed)
11/22/2019  10:35 AM  PATIENT:  Jamie Jenkins    PRE-OPERATIVE DIAGNOSIS:  right ankle fracture  POST-OPERATIVE DIAGNOSIS:  Same  PROCEDURE:  OPEN REDUCTION INTERNAL FIXATION (ORIF) RIGHT ANKLE FRACTURE  SURGEON:  Newt Minion, MD  PHYSICIAN ASSISTANT:None ANESTHESIA:   General  PREOPERATIVE INDICATIONS:  Jamie Jenkins is a  59 y.o. female with a diagnosis of right ankle fracture who failed conservative measures and elected for surgical management.    The risks benefits and alternatives were discussed with the patient preoperatively including but not limited to the risks of infection, bleeding, nerve injury, cardiopulmonary complications, the need for revision surgery, among others, and the patient was willing to proceed.  OPERATIVE IMPLANTS: 7 hole one third tubular plate laterally with cannulated screws medially.  @ENCIMAGES @  OPERATIVE FINDINGS: C-arm fluoroscopy verified congruent mortise status post open reduction internal fixation.  OPERATIVE PROCEDURE: Patient was brought to the operating room and underwent a general anesthetic after a popliteal block after adequate levels anesthesia were obtained patient's right lower extremity was prepped using DuraPrep draped into a sterile field a timeout was called.  An incision was made laterally over the fibula this was carried sharply down to bone subperiosteal dissection was used to cleanse the fracture this was irrigated the fracture was reduced the length of the fibula was restored and a 20 mm lag screw was placed to stabilize the fracture.  A neutralization plate was then placed laterally 3 compression screws proximally 2 compression screws distally C-arm fluoroscopy verified restoration of the length of the fibula.  The mortise was congruent.  2 cannulated screws were then placed in the medial malleolar fragment 46 mm in length one third threaded.  Again seen on fluoroscopy verified reduction.  The wound was irrigated with normal  saline incision was closed using 2-0 nylon and a sterile dressing was applied.  Patient was extubated taken the PACU in stable condition.   DISCHARGE PLANNING:  Antibiotic duration: Preoperative antibiotics Kefzol  Weightbearing: Nonweightbearing on the right  Pain medication: Prescription for Percocet  Dressing care/ Wound VAC: Follow-up in the office in 1 week to change the dressing  Ambulatory devices: Crutches  Discharge to: Home.  Follow-up: In the office 1 week post operative.

## 2019-11-23 ENCOUNTER — Encounter (HOSPITAL_COMMUNITY): Payer: Self-pay | Admitting: Orthopedic Surgery

## 2019-11-23 ENCOUNTER — Telehealth: Payer: Self-pay | Admitting: Orthopedic Surgery

## 2019-11-23 NOTE — Telephone Encounter (Signed)
Patient called.   She wanted to know when she is to be wearing her boot.   Call back: [phone]

## 2019-11-23 NOTE — Telephone Encounter (Signed)
S/p ORIF ankle fx pt advised that she should wear boot during waking hours and can remove before bed. Voiced understanding has an appt next week and will call with any questions.

## 2019-11-29 ENCOUNTER — Ambulatory Visit (INDEPENDENT_AMBULATORY_CARE_PROVIDER_SITE_OTHER): Payer: BC Managed Care – PPO | Admitting: Physician Assistant

## 2019-11-29 ENCOUNTER — Encounter: Payer: Self-pay | Admitting: Physician Assistant

## 2019-11-29 DIAGNOSIS — S82852A Displaced trimalleolar fracture of left lower leg, initial encounter for closed fracture: Secondary | ICD-10-CM

## 2019-11-29 DIAGNOSIS — S82851A Displaced trimalleolar fracture of right lower leg, initial encounter for closed fracture: Secondary | ICD-10-CM

## 2019-11-29 MED ORDER — OXYCODONE-ACETAMINOPHEN 5-325 MG PO TABS
1.0000 | ORAL_TABLET | ORAL | 0 refills | Status: DC | PRN
Start: 2019-11-29 — End: 2019-12-05

## 2019-11-29 NOTE — Progress Notes (Signed)
Office Visit Note   Patient: Jamie Jenkins           Date of Birth: 06/06/1960           MRN: [id number] Visit Date: 11/29/2019              Requested by: Janith Lima, MD [address] [place],  [place] [address] PCP: Janith Lima, MD  No chief complaint on file.     HPI: Pleasant woman who is 1 week status post open reduction internal fixation of her ankle fracture.  She is doing well she denies fever, chills, or calf pain she is requesting her first refill of pain medication  Assessment & Plan: Visit Diagnoses: No diagnosis found.  Plan: Continue nonweightbearing and elevation.  She may wash her ankle with an antibacterial soap such as Dial.  I will refill her pain medication follow-up in 1 week at which time x-rays of her ankle should be obtained  Follow-Up Instructions: No follow-ups on file.   Ortho Exam  Patient is alert, oriented, no adenopathy, well-dressed, normal affect, normal respiratory effort. Examination of her ankle demonstrates healing surgical incision swelling is well controlled.  No necrosis no cellulitis surgical sutures are in place compartments are soft and nontender  Imaging: No results found. No images are attached to the encounter.  Labs: Lab Results  Component Value Date   HGBA1C 5.5 02/13/2019   HGBA1C 5.6 10/05/2017   HGBA1C 5.5 06/23/2016     Lab Results  Component Value Date   ALBUMIN 3.7 10/05/2017   ALBUMIN 3.9 07/16/2017   ALBUMIN 4.3 06/23/2016    No results found for: MG Lab Results  Component Value Date   VD25OH 20.39 (L) 02/13/2019   VD25OH 7.43 (L) 10/05/2017   VD25OH 15.87 (L) 06/23/2016    No results found for: PREALBUMIN CBC EXTENDED Latest Ref Rng & Units 11/22/2019 02/13/2019 10/05/2017  WBC 4.0 - 10.5 K/uL 7.5 6.6 5.7  RBC 3.87 - 5.11 MIL/uL 4.38 4.49 3.94  HGB 12.0 - 15.0 g/dL 14.8 14.6 13.3  HCT 36 - 46 % 44.6 43.2 39.6  PLT 150 - 400 K/uL 251 249.0 238.0  NEUTROABS 1.4 - 7.7 K/uL - 3.6 2.9    LYMPHSABS 0.7 - 4.0 K/uL - 2.5 2.4     There is no height or weight on file to calculate BMI.  Orders:  No orders of the defined types were placed in this encounter.  No orders of the defined types were placed in this encounter.    Procedures: No procedures performed  Clinical Data: No additional findings.  ROS:  All other systems negative, except as noted in the HPI. Review of Systems  Objective: Vital Signs: LMP 08/23/2012   Specialty Comments:  No specialty comments available.  PMFS History: Patient Active Problem List   Diagnosis Date Noted  . Trimalleolar fracture of ankle, closed, left, initial encounter   . Trimalleolar fracture of ankle, closed, right, initial encounter   . Routine general medical examination at a health care facility 02/13/2019  . Gastroesophageal reflux disease without esophagitis 02/13/2019  . Screening for cervical cancer 02/13/2019  . Visit for screening mammogram 02/13/2019  . Hyperglycemia 10/05/2017  . Vitamin D deficiency disease 10/05/2017  . Obesity 06/08/2016  . DDD (degenerative disc disease), lumbar 05/21/2014  . Essential hypertension 05/21/2014   Past Medical History:  Diagnosis Date  . Ankle fracture    right  . DDD (degenerative disc disease), lumbar   .  Gall stones   . Hypertension     Family History  Problem Relation Age of Onset  . Pancreatic cancer Mother        Deceased  . Hypertension Father   . Deep vein thrombosis Brother        Deceased  . Healthy Sister   . Healthy Son   . Healthy Daughter     Past Surgical History:  Procedure Laterality Date  . ANKLE FUSION Left 07/21/2017   Procedure: SUBTALAR AND TALONAVICULAR FUSION LEFT FOOT;  Surgeon: Newt Minion, MD;  Location: [facility];  Service: Orthopedics;  Laterality: Left;  . AXILLARY LYMPH NODE DISSECTION Left 11/24/2012   Procedure: incision and drainage axillary abscess;  Surgeon: Adin Hector, MD;  Location: [facility];  Service: General;   Laterality: Left;  . BREAST SURGERY  2012   breast biopsy  . CHOLECYSTECTOMY    . ORIF ANKLE FRACTURE Right 11/22/2019   Procedure: OPEN REDUCTION INTERNAL FIXATION (ORIF) RIGHT ANKLE FRACTURE;  Surgeon: Newt Minion, MD;  Location: [facility];  Service: Orthopedics;  Laterality: Right;  . TUBAL LIGATION     Social History   Occupational History  . Occupation: Scientist, research (medical)  Tobacco Use  . Smoking status: Never Smoker  . Smokeless tobacco: Never Used  Vaping Use  . Vaping Use: Never used  Substance and Sexual Activity  . Alcohol use: Yes    Alcohol/week: 0.0 standard drinks    Comment: occasional  . Drug use: No  . Sexual activity: Not on file

## 2019-11-30 ENCOUNTER — Other Ambulatory Visit: Payer: Self-pay | Admitting: Internal Medicine

## 2019-11-30 DIAGNOSIS — I10 Essential (primary) hypertension: Secondary | ICD-10-CM

## 2019-12-05 ENCOUNTER — Encounter: Payer: Self-pay | Admitting: Physician Assistant

## 2019-12-05 ENCOUNTER — Ambulatory Visit (INDEPENDENT_AMBULATORY_CARE_PROVIDER_SITE_OTHER): Payer: BC Managed Care – PPO | Admitting: Physician Assistant

## 2019-12-05 ENCOUNTER — Ambulatory Visit: Payer: Self-pay

## 2019-12-05 DIAGNOSIS — Z8781 Personal history of (healed) traumatic fracture: Secondary | ICD-10-CM

## 2019-12-05 DIAGNOSIS — Z9889 Other specified postprocedural states: Secondary | ICD-10-CM

## 2019-12-05 DIAGNOSIS — S82852D Displaced trimalleolar fracture of left lower leg, subsequent encounter for closed fracture with routine healing: Secondary | ICD-10-CM | POA: Diagnosis not present

## 2019-12-05 MED ORDER — OXYCODONE-ACETAMINOPHEN 5-325 MG PO TABS
1.0000 | ORAL_TABLET | ORAL | 0 refills | Status: DC | PRN
Start: 2019-12-05 — End: 2019-12-20

## 2019-12-05 NOTE — Progress Notes (Signed)
Office Visit Note   Patient: Jamie Jenkins           Date of Birth: May 30, 1960           MRN: [id number] Visit Date: 12/05/2019              Requested by: Janith Lima, MD [address] [place],  [place] [address] PCP: Janith Lima, MD  Chief Complaint  Patient presents with  . Right Ankle - Follow-up      HPI: This is a pleasant 59 year old woman who is 2 weeks status post open reduction internal fixation of her right ankle fracture.  She denies fever, chills, or calf pain.  She is asking for a refill of her pain medicine.  Assessment & Plan: Visit Diagnoses:  1. S/P ORIF (open reduction internal fixation) fracture     Plan: Continue nonweightbearing in the boot for another 2 weeks.  I discussed with her coming out of the boot to do desensitization rubbing on her ankle and foot as well as doing gentle range of motion.  Also stretching her Achilles ever so slightly.  Follow-up in 2 weeks  Follow-Up Instructions: No follow-ups on file.   Ortho Exam  Patient is alert, oriented, no adenopathy, well-dressed, normal affect, normal respiratory effort. Focused examination of her ankle demonstrates well-healed surgical incision she had does have some soft tissue swelling no cellulitis well apposed wound edges no evidence of infection mild to moderate soft tissue swelling  Imaging: No results found. No images are attached to the encounter.  Labs: Lab Results  Component Value Date   HGBA1C 5.5 02/13/2019   HGBA1C 5.6 10/05/2017   HGBA1C 5.5 06/23/2016     Lab Results  Component Value Date   ALBUMIN 3.7 10/05/2017   ALBUMIN 3.9 07/16/2017   ALBUMIN 4.3 06/23/2016    No results found for: MG Lab Results  Component Value Date   VD25OH 20.39 (L) 02/13/2019   VD25OH 7.43 (L) 10/05/2017   VD25OH 15.87 (L) 06/23/2016    No results found for: PREALBUMIN CBC EXTENDED Latest Ref Rng & Units 11/22/2019 02/13/2019 10/05/2017  WBC 4.0 - 10.5 K/uL 7.5 6.6 5.7  RBC  3.87 - 5.11 MIL/uL 4.38 4.49 3.94  HGB 12.0 - 15.0 g/dL 14.8 14.6 13.3  HCT 36 - 46 % 44.6 43.2 39.6  PLT 150 - 400 K/uL 251 249.0 238.0  NEUTROABS 1.4 - 7.7 K/uL - 3.6 2.9  LYMPHSABS 0.7 - 4.0 K/uL - 2.5 2.4     There is no height or weight on file to calculate BMI.  Orders:  Orders Placed This Encounter  Procedures  . XR Ankle Complete Right   No orders of the defined types were placed in this encounter.    Procedures: No procedures performed  Clinical Data: No additional findings.  ROS:  All other systems negative, except as noted in the HPI. Review of Systems  Objective: Vital Signs: LMP 08/23/2012   Specialty Comments:  No specialty comments available.  PMFS History: Patient Active Problem List   Diagnosis Date Noted  . Trimalleolar fracture of ankle, closed, left, initial encounter   . Trimalleolar fracture of ankle, closed, right, initial encounter   . Routine general medical examination at a health care facility 02/13/2019  . Gastroesophageal reflux disease without esophagitis 02/13/2019  . Screening for cervical cancer 02/13/2019  . Visit for screening mammogram 02/13/2019  . Hyperglycemia 10/05/2017  . Vitamin D deficiency disease 10/05/2017  . Obesity 06/08/2016  .  DDD (degenerative disc disease), lumbar 05/21/2014  . Essential hypertension 05/21/2014   Past Medical History:  Diagnosis Date  . Ankle fracture    right  . DDD (degenerative disc disease), lumbar   . Gall stones   . Hypertension     Family History  Problem Relation Age of Onset  . Pancreatic cancer Mother        Deceased  . Hypertension Father   . Deep vein thrombosis Brother        Deceased  . Healthy Sister   . Healthy Son   . Healthy Daughter     Past Surgical History:  Procedure Laterality Date  . ANKLE FUSION Left 07/21/2017   Procedure: SUBTALAR AND TALONAVICULAR FUSION LEFT FOOT;  Surgeon: Newt Minion, MD;  Location: [facility];  Service: Orthopedics;  Laterality:  Left;  . AXILLARY LYMPH NODE DISSECTION Left 11/24/2012   Procedure: incision and drainage axillary abscess;  Surgeon: Adin Hector, MD;  Location: [facility];  Service: General;  Laterality: Left;  . BREAST SURGERY  2012   breast biopsy  . CHOLECYSTECTOMY    . ORIF ANKLE FRACTURE Right 11/22/2019   Procedure: OPEN REDUCTION INTERNAL FIXATION (ORIF) RIGHT ANKLE FRACTURE;  Surgeon: Newt Minion, MD;  Location: [facility];  Service: Orthopedics;  Laterality: Right;  . TUBAL LIGATION     Social History   Occupational History  . Occupation: Scientist, research (medical)  Tobacco Use  . Smoking status: Never Smoker  . Smokeless tobacco: Never Used  Vaping Use  . Vaping Use: Never used  Substance and Sexual Activity  . Alcohol use: Yes    Alcohol/week: 0.0 standard drinks    Comment: occasional  . Drug use: No  . Sexual activity: Not on file

## 2019-12-05 NOTE — Addendum Note (Signed)
Addended by: Georgette Dover on: 12/05/2019 04:26 PM   Modules accepted: Orders

## 2019-12-11 ENCOUNTER — Telehealth: Payer: Self-pay | Admitting: Orthopedic Surgery

## 2019-12-11 NOTE — Telephone Encounter (Signed)
Patient called.   She wanted to know if she could be a rx for a knee scooter.   Call back: [phone]

## 2019-12-11 NOTE — Telephone Encounter (Signed)
I called and sw pt to advise that this item is not covered by insurance so she can go to [facility] and do a week to week rental without an rx needed. Pt voiced understanding and will call with any questions.

## 2019-12-13 ENCOUNTER — Ambulatory Visit: Payer: BC Managed Care – PPO | Admitting: Physician Assistant

## 2019-12-19 ENCOUNTER — Ambulatory Visit: Payer: BC Managed Care – PPO | Admitting: Physician Assistant

## 2019-12-20 ENCOUNTER — Encounter: Payer: Self-pay | Admitting: Physician Assistant

## 2019-12-20 ENCOUNTER — Ambulatory Visit (INDEPENDENT_AMBULATORY_CARE_PROVIDER_SITE_OTHER): Payer: BC Managed Care – PPO | Admitting: Physician Assistant

## 2019-12-20 ENCOUNTER — Ambulatory Visit: Payer: Self-pay

## 2019-12-20 DIAGNOSIS — S82852A Displaced trimalleolar fracture of left lower leg, initial encounter for closed fracture: Secondary | ICD-10-CM

## 2019-12-20 MED ORDER — OXYCODONE-ACETAMINOPHEN 5-325 MG PO TABS
1.0000 | ORAL_TABLET | ORAL | 0 refills | Status: DC | PRN
Start: 2019-12-20 — End: 2020-01-08

## 2019-12-20 NOTE — Progress Notes (Signed)
Office Visit Note   Patient: Jamie Jenkins           Date of Birth: 11/18/1960           MRN: [id number] Visit Date: 12/20/2019              Requested by: Janith Lima, MD [address] [place],  [place] [address] PCP: Janith Lima, MD  No chief complaint on file.     HPI: Patient is a pleasant 59 year old woman who is 1 month status post ORIF trimalleolar ankle fracture.  She denies fever, chills, or calf pain.  She is complaining of some swelling in her foot she has been working on light ankle range of motion  Assessment & Plan: Visit Diagnoses:  1. Trimalleolar fracture of ankle, closed, left, initial encounter     Plan: Physical therapy was ordered today.  She may begin weightbearing as tolerated in the boot.  We talked about wearing compression sock which she has at home.  Follow-up in 4 weeks.  Follow-Up Instructions: No follow-ups on file.   Ortho Exam  Patient is alert, oriented, no adenopathy, well-dressed, normal affect, normal respiratory effort. Well-healed surgical incision no cellulitis no erythema.  Mild to moderate amount of soft tissue swelling which is in the foot and the ankle.  No calf swelling negative Homans' sign calfs are soft and compressible no cellulitis  Imaging: No results found. No images are attached to the encounter.  Labs: Lab Results  Component Value Date   HGBA1C 5.5 02/13/2019   HGBA1C 5.6 10/05/2017   HGBA1C 5.5 06/23/2016     Lab Results  Component Value Date   ALBUMIN 3.7 10/05/2017   ALBUMIN 3.9 07/16/2017   ALBUMIN 4.3 06/23/2016    No results found for: MG Lab Results  Component Value Date   VD25OH 20.39 (L) 02/13/2019   VD25OH 7.43 (L) 10/05/2017   VD25OH 15.87 (L) 06/23/2016    No results found for: PREALBUMIN CBC EXTENDED Latest Ref Rng & Units 11/22/2019 02/13/2019 10/05/2017  WBC 4.0 - 10.5 K/uL 7.5 6.6 5.7  RBC 3.87 - 5.11 MIL/uL 4.38 4.49 3.94  HGB 12.0 - 15.0 g/dL 14.8 14.6 13.3  HCT 36 - 46 %  44.6 43.2 39.6  PLT 150 - 400 K/uL 251 249.0 238.0  NEUTROABS 1.4 - 7.7 K/uL - 3.6 2.9  LYMPHSABS 0.7 - 4.0 K/uL - 2.5 2.4     There is no height or weight on file to calculate BMI.  Orders:  Orders Placed This Encounter  Procedures  . XR Ankle Complete Right  . Ambulatory referral to Physical Therapy   Meds ordered this encounter  Medications  . oxyCODONE-acetaminophen (PERCOCET) 5-325 MG tablet    Sig: Take 1 tablet by mouth every 4 (four) hours as needed.    Dispense:  30 tablet    Refill:  0     Procedures: No procedures performed  Clinical Data: No additional findings.  ROS:  All other systems negative, except as noted in the HPI. Review of Systems  Objective: Vital Signs: LMP 08/23/2012   Specialty Comments:  No specialty comments available.  PMFS History: Patient Active Problem List   Diagnosis Date Noted  . Trimalleolar fracture of ankle, closed, left, initial encounter   . Trimalleolar fracture of ankle, closed, right, initial encounter   . Routine general medical examination at a health care facility 02/13/2019  . Gastroesophageal reflux disease without esophagitis 02/13/2019  . Screening for cervical cancer  02/13/2019  . Visit for screening mammogram 02/13/2019  . Hyperglycemia 10/05/2017  . Vitamin D deficiency disease 10/05/2017  . Obesity 06/08/2016  . DDD (degenerative disc disease), lumbar 05/21/2014  . Essential hypertension 05/21/2014   Past Medical History:  Diagnosis Date  . Ankle fracture    right  . DDD (degenerative disc disease), lumbar   . Gall stones   . Hypertension     Family History  Problem Relation Age of Onset  . Pancreatic cancer Mother        Deceased  . Hypertension Father   . Deep vein thrombosis Brother        Deceased  . Healthy Sister   . Healthy Son   . Healthy Daughter     Past Surgical History:  Procedure Laterality Date  . ANKLE FUSION Left 07/21/2017   Procedure: SUBTALAR AND TALONAVICULAR FUSION  LEFT FOOT;  Surgeon: Newt Minion, MD;  Location: [facility];  Service: Orthopedics;  Laterality: Left;  . AXILLARY LYMPH NODE DISSECTION Left 11/24/2012   Procedure: incision and drainage axillary abscess;  Surgeon: Adin Hector, MD;  Location: [facility];  Service: General;  Laterality: Left;  . BREAST SURGERY  2012   breast biopsy  . CHOLECYSTECTOMY    . ORIF ANKLE FRACTURE Right 11/22/2019   Procedure: OPEN REDUCTION INTERNAL FIXATION (ORIF) RIGHT ANKLE FRACTURE;  Surgeon: Newt Minion, MD;  Location: [facility];  Service: Orthopedics;  Laterality: Right;  . TUBAL LIGATION     Social History   Occupational History  . Occupation: Scientist, research (medical)  Tobacco Use  . Smoking status: Never Smoker  . Smokeless tobacco: Never Used  Vaping Use  . Vaping Use: Never used  Substance and Sexual Activity  . Alcohol use: Yes    Alcohol/week: 0.0 standard drinks    Comment: occasional  . Drug use: No  . Sexual activity: Not on file

## 2019-12-22 ENCOUNTER — Ambulatory Visit: Payer: BC Managed Care – PPO | Admitting: Rehabilitative and Restorative Service Providers"

## 2019-12-26 ENCOUNTER — Ambulatory Visit (INDEPENDENT_AMBULATORY_CARE_PROVIDER_SITE_OTHER): Payer: BC Managed Care – PPO | Admitting: Physical Therapy

## 2019-12-26 ENCOUNTER — Other Ambulatory Visit: Payer: Self-pay

## 2019-12-26 ENCOUNTER — Encounter: Payer: Self-pay | Admitting: Physical Therapy

## 2019-12-26 DIAGNOSIS — R600 Localized edema: Secondary | ICD-10-CM | POA: Diagnosis not present

## 2019-12-26 DIAGNOSIS — M25571 Pain in right ankle and joints of right foot: Secondary | ICD-10-CM

## 2019-12-26 DIAGNOSIS — R2689 Other abnormalities of gait and mobility: Secondary | ICD-10-CM | POA: Diagnosis not present

## 2019-12-26 DIAGNOSIS — M25671 Stiffness of right ankle, not elsewhere classified: Secondary | ICD-10-CM | POA: Diagnosis not present

## 2019-12-26 DIAGNOSIS — R2681 Unsteadiness on feet: Secondary | ICD-10-CM

## 2019-12-26 DIAGNOSIS — M6281 Muscle weakness (generalized): Secondary | ICD-10-CM

## 2019-12-26 NOTE — Therapy (Signed)
[facility] Physical Therapy [address] [place], [place], [address] Phone: [phone]   Fax:  [phone]  Physical Therapy Evaluation  Patient Details  Name: Jamie Jenkins MRN: [id number] Date of Birth: 1961/02/13 Referring Provider (PT): Bevely Palmer Persons, [place]   Encounter Date: 12/26/2019   PT End of Session - 12/26/19 1152    Visit Number 1    Number of Visits 18    Authorization Type BCBS Comm PPO    PT Start Time [phone]    PT Stop Time 1025    PT Time Calculation (min) 56 min    Equipment Utilized During Treatment Other (comment)   CAM boot   Activity Tolerance Patient limited by pain    Behavior During Therapy [facility] for tasks assessed/performed           Past Medical History:  Diagnosis Date  . Ankle fracture    right  . DDD (degenerative disc disease), lumbar   . Gall stones   . Hypertension     Past Surgical History:  Procedure Laterality Date  . ANKLE FUSION Left 07/21/2017   Procedure: SUBTALAR AND TALONAVICULAR FUSION LEFT FOOT;  Surgeon: Newt Minion, MD;  Location: [facility];  Service: Orthopedics;  Laterality: Left;  . AXILLARY LYMPH NODE DISSECTION Left 11/24/2012   Procedure: incision and drainage axillary abscess;  Surgeon: Adin Hector, MD;  Location: [facility];  Service: General;  Laterality: Left;  . BREAST SURGERY  2012   breast biopsy  . CHOLECYSTECTOMY    . ORIF ANKLE FRACTURE Right 11/22/2019   Procedure: OPEN REDUCTION INTERNAL FIXATION (ORIF) RIGHT ANKLE FRACTURE;  Surgeon: Newt Minion, MD;  Location: [facility];  Service: Orthopedics;  Laterality: Right;  . TUBAL LIGATION      There were no vitals filed for this visit.    Subjective Assessment - 12/26/19 0935    Subjective This 59yo female was referred to PT 12/20/2019 by Bevely Palmer Persons, PA with Trimalleolar Fracture ankle (S82.852A) WBAT in boot. She underwent an ORIF right ankle 11/22/2019 after slip & fall at work on 11/15/2019.    Pertinent History 11/22/2019 right ankle  trimalleolar fx w/ORIF, left subtalar & talonavicular fusion 07/21/17, DDD, HTN    How long can you sit comfortably? 20 minutes    How long can you stand comfortably? 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 Yes    Pain Score 10-Worst pain ever   in last week, lowest 6/10   Pain Location Ankle    Pain Orientation Right    Pain Descriptors / Indicators Sharp;Throbbing;Stabbing    Pain Type Surgical pain    Pain Onset More than a month ago    Pain Frequency Constant    Aggravating Factors  walking    Pain Relieving Factors medication, ice, elevate and rub area    Effect of Pain on Daily Activities limits standing & walking              [facility] PT Assessment - 12/26/19 0930      Assessment   Medical Diagnosis Right Trimalleolar Fracture ORIF    Referring Provider (PT) Bevely Palmer Persons, PA    Onset Date/Surgical Date 11/22/19   ORIF surgery   Hand Dominance Right    Prior Therapy None for right ankle, PT after left ankle in 2019  Precautions   Precautions Fall    Required Braces or Orthoses Other Brace/Splint   walking boot for standing     Restrictions   Weight Bearing Restrictions Yes    RUE Weight Bearing Weight bearing as tolerated      Balance Screen   Has the patient fallen in the past 6 months Yes    How many times? 1   slip on water   Has the patient had a decrease in activity level because of a fear of falling?  No   cautious   Is the patient reluctant to leave their home because of a fear of falling?  Yes      [place] residence    Living Arrangements Spouse/significant other    Type of [place] to enter    Entrance Stairs-Number of Steps 3    [place] One level    [place];Tub bench;Other (comment)   kneel walker       Prior Function   Level of Independence Independent;Independent with household mobility without device;Independent with community mobility without device    Vocation Full time employment    Vocation Requirements walk in community, stand for 45-60 minutes    Leisure walk in park, reading, church choir      Observation/Other Assessments   Focus on Therapeutic Outcomes (FOTO)  25% Functional 75% limited      Observation/Other Assessments-Edema    Edema Circumferential      Circumferential Edema   Circumferential - Right 2cm proximal to malleoli - 23.1cm   malleoli - 29.1cm   foot distal to malleoli - 30.6cm  arch of foot - 23.8cm    Circumferential - Left  2cm proximal to malleoli - 21.7cm   malleoli - 27.1cm   foot distal to malleoli - 30.1cm  arch of foot - 22.7cm      ROM / Strength   AROM / PROM / Strength AROM;PROM;Strength      PROM   Overall PROM  Deficits    Right Ankle Dorsiflexion -13    Right Ankle Plantar Flexion 18    Left Ankle Dorsiflexion 6    Left Ankle Plantar Flexion 32      Strength   Overall Strength Deficits    Overall Strength Comments gross BUE, LLE & right hip/knee 5/5    Right Ankle Dorsiflexion 2-/5    Right Ankle Plantar Flexion 2-/5    Right Ankle Inversion 2-/5    Right Ankle Eversion 2-/5      Ambulation/Gait   Ambulation/Gait Yes    Ambulation/Gait Assistance 5: Supervision    Ambulation/Gait Assistance Details WBAT RLE with minimal weight accepted    Ambulation Distance (Feet) 100 Feet    Assistive device Crutches;Other (Comment)   CAM walking boot RLE   Gait Pattern Step-to pattern;Decreased step length - left;Decreased stance time - right;Antalgic    Ambulation Surface Level;Indoor                      Objective measurements completed on examination: See above findings.       [facility] Adult PT Treatment/Exercise - 12/26/19 0930      Self-Care   Self-Care Heat/Ice Application    RICE elevation with feet higher than heart  10-20 minutes 3-4 times / day.      Heat/Ice Application ice massage with frozen water bottle to lateral ankle &  rolling under foot seated,  ice bath      Modalities   Modalities Vasopneumatic      Vasopneumatic   Number Minutes Vasopneumatic  8 minutes    Vasopnuematic Location  Ankle    Vasopneumatic Pressure Medium    Vasopneumatic Temperature  34      Ankle Exercises: Stretches   Other Stretch toe flexion / extension stretch sitting with LE crossed 5 sec hold 2 reps ea toe    Other Stretch toe abduction spreading each toe with finger 5 sec hold 2 reps bw ea toe      Ankle Exercises: Seated   ABC's 1 rep                    PT Short Term Goals - 12/26/19 1208      PT SHORT TERM GOAL #1   Title Patient demonstrates understanding of initial HEP.    Time 1    Period Months    Status New    Target Date 01/26/20      PT SHORT TERM GOAL #2   Title Patient reports right ankle pain </= 5/10 with non-weightbearing exercises.    Time 1    Period Months    Status New    Target Date 01/26/20             PT Long Term Goals - 12/26/19 1203      PT LONG TERM GOAL #1   Title Patient demonstrates & verbalizes understanding of ongoing HEP.    Time 2    Period Months    Status New    Target Date 02/23/20      PT LONG TERM GOAL #2   Title FOTO score >/= 60% functional or <40% limited    Time 2    Period Months    Status New    Target Date 02/23/20      PT LONG TERM GOAL #3   Title right ankle range within 10* of left ankle range.    Time 2    Period Months    Status New    Target Date 02/23/20      PT LONG TERM GOAL #4   Title Patient ambulates >500' and negotiates ramps, curbs & stairs without device independently.    Time 2    Period Months    Status New    Target Date 02/23/20      PT LONG TERM GOAL #5   Title Right single leg stance >10seconds    Time 2    Period Months    Status New    Target Date 02/23/20      Additional Long Term Goals    Additional Long Term Goals Yes      PT LONG TERM GOAL #6   Title right ankle & foot pain </= 2/10 with standing & gait activities    Time 2    Period Months    Status New    Target Date 02/23/20                  Plan - 12/26/19 1153    Clinical Impression Statement This 60yo female fractured her right ankle with ORIF ~ 5 weeks prior to PT evaluation. She has pain, edema, limited range and strength in right ankle & foot.  She is ambulating with crutches & CAM walker with WBAT / minimal weight on RLE.  She would benefit from skilled PT to improve range, strength &  function.    Personal Factors and Comorbidities Comorbidity 3+    Comorbidities left subtalar & talonavicular fusion 07/21/17, DDD, HTN    Examination-Activity Limitations Lift;Locomotion Level;Squat;Stairs;Sit;Stand    Examination-Participation Musician;Occupation    Stability/Clinical Decision Making Stable/Uncomplicated    Clinical Decision Making Moderate    Rehab Potential Good    PT Frequency 2x / week    PT Duration Other (comment)   9 weeks   PT Treatment/Interventions ADLs/Self Care Home Management;Electrical Stimulation;Cryotherapy;DME Instruction;Gait training;Stair training;Contrast Bath;Functional mobility training;Therapeutic activities;Therapeutic exercise;Balance training;Neuromuscular re-education;Patient/family education;Manual techniques;Scar mobilization;Passive range of motion;Dry needling;Vasopneumatic Device;Vestibular;Joint Manipulations    PT Next Visit Plan update HEP, ankle range, scar mobilization, vasopneumatic    PT Home Exercise Plan Access Code: [email or web address]    Consulted and Agree with Plan of Care Patient           Patient will benefit from skilled therapeutic intervention in order to improve the following deficits and impairments:  Abnormal gait, Decreased balance, Decreased endurance, Decreased mobility, Decreased range of motion, Decreased scar mobility,  Decreased strength, Increased edema, Impaired flexibility, Pain  Visit Diagnosis: Stiffness of right ankle, not elsewhere classified  Localized edema  Pain in right ankle and joints of right foot  Other abnormalities of gait and mobility  Unsteadiness on feet  Muscle weakness (generalized)     Problem List Patient Active Problem List   Diagnosis Date Noted  . Trimalleolar fracture of ankle, closed, left, initial encounter   . Trimalleolar fracture of ankle, closed, right, initial encounter   . Routine general medical examination at a health care facility 02/13/2019  . Gastroesophageal reflux disease without esophagitis 02/13/2019  . Screening for cervical cancer 02/13/2019  . Visit for screening mammogram 02/13/2019  . Hyperglycemia 10/05/2017  . Vitamin D deficiency disease 10/05/2017  . Obesity 06/08/2016  . DDD (degenerative disc disease), lumbar 05/21/2014  . Essential hypertension 05/21/2014    Jamey Reas PT, DPT 12/26/2019, 12:10 PM  [facility] Physical Therapy [address] [place], [place], [address] Phone: [phone]   Fax:  [phone]  Name: Jamie Jenkins MRN: [id number] Date of Birth: 1960/09/04

## 2019-12-26 NOTE — Patient Instructions (Signed)
Access Code: [email or web address] URL: [email or web address] Prepared by: Jamey Reas  Exercises Seated Toe Flexion Extension PROM - 2-3 x daily - 7 x weekly - 1 sets - 5 reps - 10-20 seconds hold Toe Spreading - 1 x daily - 5 x weekly - 1 sets - 10 reps - 5 seconds hold  Patient Education Ice Massage

## 2019-12-28 ENCOUNTER — Other Ambulatory Visit: Payer: Self-pay

## 2019-12-28 ENCOUNTER — Ambulatory Visit (INDEPENDENT_AMBULATORY_CARE_PROVIDER_SITE_OTHER): Payer: BC Managed Care – PPO | Admitting: Physical Therapy

## 2019-12-28 DIAGNOSIS — R600 Localized edema: Secondary | ICD-10-CM

## 2019-12-28 DIAGNOSIS — R2689 Other abnormalities of gait and mobility: Secondary | ICD-10-CM

## 2019-12-28 DIAGNOSIS — M25571 Pain in right ankle and joints of right foot: Secondary | ICD-10-CM

## 2019-12-28 DIAGNOSIS — R2681 Unsteadiness on feet: Secondary | ICD-10-CM

## 2019-12-28 DIAGNOSIS — M25671 Stiffness of right ankle, not elsewhere classified: Secondary | ICD-10-CM

## 2019-12-28 DIAGNOSIS — M6281 Muscle weakness (generalized): Secondary | ICD-10-CM

## 2019-12-28 NOTE — Therapy (Signed)
[facility] Physical Therapy [address] [place], [place], [address] Phone: [phone]   Fax:  [phone]  Physical Therapy Treatment  Patient Details  Name: Jamie Jenkins MRN: [id number] Date of Birth: [birth year] Referring Provider (PT): Bevely Palmer Persons, [place]   Encounter Date: 12/28/2019   PT End of Session - 12/28/19 0835    Visit Number 2    Number of Visits 18    Authorization Type BCBS Comm PPO    PT Start Time 0800    PT Stop Time 0845    PT Time Calculation (min) 45 min    Equipment Utilized During Treatment Other (comment)   CAM boot   Activity Tolerance Patient limited by pain    Behavior During Therapy [facility] for tasks assessed/performed           Past Medical History:  Diagnosis Date   Ankle fracture    right   DDD (degenerative disc disease), lumbar    Gall stones    Hypertension     Past Surgical History:  Procedure Laterality Date   ANKLE FUSION Left 07/21/2017   Procedure: SUBTALAR AND TALONAVICULAR FUSION LEFT FOOT;  Surgeon: Newt Minion, MD;  Location: [facility];  Service: Orthopedics;  Laterality: Left;   AXILLARY LYMPH NODE DISSECTION Left 11/24/2012   Procedure: incision and drainage axillary abscess;  Surgeon: Adin Hector, MD;  Location: [facility];  Service: General;  Laterality: Left;   BREAST SURGERY  2012   breast biopsy   CHOLECYSTECTOMY     ORIF ANKLE FRACTURE Right 11/22/2019   Procedure: OPEN REDUCTION INTERNAL FIXATION (ORIF) RIGHT ANKLE FRACTURE;  Surgeon: Newt Minion, MD;  Location: [facility];  Service: Orthopedics;  Laterality: Right;   TUBAL LIGATION      There were no vitals filed for this visit.   Subjective Assessment - 12/28/19 0811    Subjective relays about 9/10  pain in her Rt ankle    Pertinent History 11/22/2019 right ankle trimalleolar fx w/ORIF, left subtalar & talonavicular fusion 07/21/17, DDD, HTN    How long can you sit comfortably? 20 minutes    How long can you stand comfortably? 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 gentle grade 1-2 mobs for A-P and distraction      Ankle Exercises: Aerobic   Nustep L2, stopped at 4:30 due to pain      Ankle Exercises: Stretches   Gastroc Stretch 4 reps;30 seconds   seated with strap     Ankle Exercises: Seated   Heel Raises 10 reps    Toe Raise 10 reps    Other Seated Ankle Exercises rocker board 2 min A-P, 2 min lateral    Other Seated Ankle Exercises toe curls 20 reps, cirlces 20 reps                    PT Short Term Goals - 12/26/19 1208  PT SHORT TERM GOAL #1   Title Patient demonstrates understanding of initial HEP.    Time 1    Period Months    Status New    Target Date 01/26/20      PT SHORT TERM GOAL #2   Title Patient reports right ankle pain </= 5/10 with non-weightbearing exercises.    Time 1    Period Months    Status New    Target Date 01/26/20             PT Long Term Goals - 12/26/19 1203      PT LONG TERM GOAL #1   Title Patient demonstrates & verbalizes understanding of ongoing HEP.    Time 2    Period Months    Status New    Target Date 02/23/20      PT LONG TERM GOAL #2   Title FOTO score >/= 60% functional or <40% limited    Time 2    Period Months    Status New    Target Date 02/23/20      PT LONG TERM GOAL #3   Title right ankle range within 10* of left ankle range.    Time 2    Period Months    Status New    Target Date 02/23/20      PT LONG TERM GOAL #4   Title Patient ambulates >500' and  negotiates ramps, curbs & stairs without device independently.    Time 2    Period Months    Status New    Target Date 02/23/20      PT LONG TERM GOAL #5   Title Right single leg stance >10seconds    Time 2    Period Months    Status New    Target Date 02/23/20      Additional Long Term Goals   Additional Long Term Goals Yes      PT LONG TERM GOAL #6   Title right ankle & foot pain </= 2/10 with standing & gait activities    Time 2    Period Months    Status New    Target Date 02/23/20                 Plan - 12/28/19 0837    Clinical Impression Statement Session focused on ROM as tolerated. She has minimal ability to put weight on Rt LE so worked in [place] bearing positions and will attempt to progress weight bearing tolerance as able.    Personal Factors and Comorbidities Comorbidity 3+    Comorbidities left subtalar & talonavicular fusion 07/21/17, DDD, HTN    Examination-Activity Limitations Lift;Locomotion Level;Squat;Stairs;Sit;Stand    Examination-Participation Musician;Occupation    Stability/Clinical Decision Making Stable/Uncomplicated    Rehab Potential Good    PT Frequency 2x / week    PT Duration Other (comment)   9 weeks   PT Treatment/Interventions ADLs/Self Care Home Management;Electrical Stimulation;Cryotherapy;DME Instruction;Gait training;Stair training;Contrast Bath;Functional mobility training;Therapeutic activities;Therapeutic exercise;Balance training;Neuromuscular re-education;Patient/family education;Manual techniques;Scar mobilization;Passive range of motion;Dry needling;Vasopneumatic Device;Vestibular;Joint Manipulations    PT Next Visit Plan ankle range, weight acceptance as able, scar mobilization, vasopneumatic    PT Home Exercise Plan Access Code: [email or web address]    Consulted and Agree with Plan of Care Patient           Patient will benefit from skilled therapeutic intervention in order to improve the following  deficits and impairments:  Abnormal gait, Decreased balance, Decreased endurance, Decreased mobility, Decreased  range of motion, Decreased scar mobility, Decreased strength, Increased edema, Impaired flexibility, Pain  Visit Diagnosis: Stiffness of right ankle, not elsewhere classified  Localized edema  Pain in right ankle and joints of right foot  Other abnormalities of gait and mobility  Unsteadiness on feet  Muscle weakness (generalized)     Problem List Patient Active Problem List   Diagnosis Date Noted   Trimalleolar fracture of ankle, closed, left, initial encounter    Trimalleolar fracture of ankle, closed, right, initial encounter    Routine general medical examination at a health care facility 02/13/2019   Gastroesophageal reflux disease without esophagitis 02/13/2019   Screening for cervical cancer 02/13/2019   Visit for screening mammogram 02/13/2019   Hyperglycemia 10/05/2017   Vitamin D deficiency disease 10/05/2017   Obesity 06/08/2016   DDD (degenerative disc disease), lumbar 05/21/2014   Essential hypertension 05/21/2014    Debbe Odea ,PT,DPT 12/28/2019, 8:39 AM  [facility] Physical Therapy [address] [place], [place], [address] Phone: [phone]   Fax:  [phone]  Name: Jamie Jenkins MRN: [id number] Date of Birth: [birth year]

## 2020-01-02 ENCOUNTER — Other Ambulatory Visit: Payer: Self-pay

## 2020-01-02 ENCOUNTER — Encounter: Payer: Self-pay | Admitting: Physical Therapy

## 2020-01-02 ENCOUNTER — Ambulatory Visit (INDEPENDENT_AMBULATORY_CARE_PROVIDER_SITE_OTHER): Payer: BC Managed Care – PPO | Admitting: Physical Therapy

## 2020-01-02 DIAGNOSIS — R2689 Other abnormalities of gait and mobility: Secondary | ICD-10-CM

## 2020-01-02 DIAGNOSIS — M25571 Pain in right ankle and joints of right foot: Secondary | ICD-10-CM

## 2020-01-02 DIAGNOSIS — M25671 Stiffness of right ankle, not elsewhere classified: Secondary | ICD-10-CM

## 2020-01-02 DIAGNOSIS — R600 Localized edema: Secondary | ICD-10-CM

## 2020-01-02 DIAGNOSIS — R2681 Unsteadiness on feet: Secondary | ICD-10-CM

## 2020-01-02 DIAGNOSIS — M6281 Muscle weakness (generalized): Secondary | ICD-10-CM

## 2020-01-02 NOTE — Therapy (Signed)
[facility] Physical Therapy [address] [place], [place], [address] Phone: [phone]   Fax:  [phone]  Physical Therapy Treatment  Patient Details  Name: Laportia Carley MRN: [id number] Date of Birth: December 26, 1960 Referring Provider (PT): Bevely Palmer Persons, [place]   Encounter Date: 01/02/2020   PT End of Session - 01/02/20 0916    Visit Number 3    Number of Visits 18    Authorization Type BCBS Comm PPO    PT Start Time 0920    PT Stop Time 1015    PT Time Calculation (min) 55 min    Equipment Utilized During Treatment Other (comment)   CAM boot   Activity Tolerance Patient limited by pain    Behavior During Therapy [facility] for tasks assessed/performed           Past Medical History:  Diagnosis Date   Ankle fracture    right   DDD (degenerative disc disease), lumbar    Gall stones    Hypertension     Past Surgical History:  Procedure Laterality Date   ANKLE FUSION Left 07/21/2017   Procedure: SUBTALAR AND TALONAVICULAR FUSION LEFT FOOT;  Surgeon: Newt Minion, MD;  Location: [place];  Service: Orthopedics;  Laterality: Left;   AXILLARY LYMPH NODE DISSECTION Left 11/24/2012   Procedure: incision and drainage axillary abscess;  Surgeon: Adin Hector, MD;  Location: [place];  Service: General;  Laterality: Left;   BREAST SURGERY  2012   breast biopsy   CHOLECYSTECTOMY     ORIF ANKLE FRACTURE Right 11/22/2019   Procedure: OPEN REDUCTION INTERNAL FIXATION (ORIF) RIGHT ANKLE FRACTURE;  Surgeon: Newt Minion, MD;  Location: [place];  Service: Orthopedics;  Laterality: Right;   TUBAL LIGATION      There were no vitals filed for this visit.   Subjective Assessment - 01/02/20 0917    Subjective She had to drive this morning which made her ankle throb.  Her exercises are going well.    Pertinent History 11/22/2019 right ankle trimalleolar fx w/ORIF, left subtalar & talonavicular fusion 07/21/17, DDD, HTN    How long can you sit comfortably? 20  minutes    How long can you stand comfortably? 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 Yes    Pain Score 8     Pain Location Ankle    Pain Orientation Right;Anterior;Medial    Pain Descriptors / Indicators Throbbing    Pain Type Surgical pain    Pain Onset More than a month ago    Pain Frequency Constant    Aggravating Factors  standing & walking too much    Pain Relieving Factors sitting to rest, ice / elevation    Effect of Pain on Daily Activities limits standing                             [facility] Adult PT Treatment/Exercise - 01/02/20 0917      Ambulation/Gait   Ambulation/Gait Yes    Ambulation/Gait Assistance 5: Supervision    Ambulation/Gait Assistance Details she reports trying to walk with one crutch and sometimes without CAM boot.  PT advised not to do either due to current MD orders.  PT demo & verbal cues to limit UE weight bearing with 2 crutches & use step thru pattern.  Ambulation Distance (Feet) 100 Feet    Assistive device Crutches;Other (Comment)   CAM boot   Ambulation Surface Level;Indoor      Self-Care   Self-Care Other Self-Care Comments    Other Self-Care Comments  CAM boot creates leg length difference of 1/2"  PT recommended stationary standing with CAM boot & 1/2" lift under LLE.        Vasopneumatic   Number Minutes Vasopneumatic  10 minutes    Vasopnuematic Location  Ankle    Vasopneumatic Pressure Medium    Vasopneumatic Temperature  34      Manual Therapy   Manual therapy comments Rt ankle PROM all planes to toleranc. gentle grade 1-2 mobs for A-P and distraction      Ankle Exercises: Aerobic   Nustep seat 10 level 2 without boot using BLEs & BUEs 5 minutes      Ankle Exercises: Stretches   Gastroc Stretch 30 seconds;3 reps   seated with strap   Other Stretch inversion / eversion with  towel assist seated with knee bent. 10 sec hold 10 reps ea      Ankle Exercises: Seated   Towel Crunch 3 reps    Towel Inversion/Eversion 5 reps    Heel Raises 10 reps;Right;3 seconds    Toe Raise 10 reps;3 seconds   RLE   BAPS Sitting;Level 2;10 reps   2 sets   BAPS Limitations ant/post, med/lat and circles    Other Seated Ankle Exercises --    Other Seated Ankle Exercises --                    PT Short Term Goals - 12/26/19 1208      PT SHORT TERM GOAL #1   Title Patient demonstrates understanding of initial HEP.    Time 1    Period Months    Status New    Target Date 01/26/20      PT SHORT TERM GOAL #2   Title Patient reports right ankle pain </= 5/10 with non-weightbearing exercises.    Time 1    Period Months    Status New    Target Date 01/26/20             PT Long Term Goals - 12/26/19 1203      PT LONG TERM GOAL #1   Title Patient demonstrates & verbalizes understanding of ongoing HEP.    Time 2    Period Months    Status New    Target Date 02/23/20      PT LONG TERM GOAL #2   Title FOTO score >/= 60% functional or <40% limited    Time 2    Period Months    Status New    Target Date 02/23/20      PT LONG TERM GOAL #3   Title right ankle range within 10* of left ankle range.    Time 2    Period Months    Status New    Target Date 02/23/20      PT LONG TERM GOAL #4   Title Patient ambulates >500' and negotiates ramps, curbs & stairs without device independently.    Time 2    Period Months    Status New    Target Date 02/23/20      PT LONG TERM GOAL #5   Title Right single leg stance >10seconds    Time 2    Period Months    Status New  Target Date 02/23/20      Additional Long Term Goals   Additional Long Term Goals Yes      PT LONG TERM GOAL #6   Title right ankle & foot pain </= 2/10 with standing & gait activities    Time 2    Period Months    Status New    Target Date 02/23/20                 Plan -  01/02/20 0917    Clinical Impression Statement PT progressed nonweight bearing ankle exercises.  PT instructed in stationary standing with CAM boot & 1/2" lift under RLE for equal height. PT also instructed in gait with decreasing UE weight bearing over single crutch and step thru pattern.    Personal Factors and Comorbidities Comorbidity 3+    Comorbidities left subtalar & talonavicular fusion 07/21/17, DDD, HTN    Examination-Activity Limitations Lift;Locomotion Level;Squat;Stairs;Sit;Stand    Examination-Participation Musician;Occupation    Stability/Clinical Decision Making Stable/Uncomplicated    Rehab Potential Good    PT Frequency 2x / week    PT Duration Other (comment)   9 weeks   PT Treatment/Interventions ADLs/Self Care Home Management;Electrical Stimulation;Cryotherapy;DME Instruction;Gait training;Stair training;Contrast Bath;Functional mobility training;Therapeutic activities;Therapeutic exercise;Balance training;Neuromuscular re-education;Patient/family education;Manual techniques;Scar mobilization;Passive range of motion;Dry needling;Vasopneumatic Device;Vestibular;Joint Manipulations    PT Next Visit Plan ankle range, weight acceptance as able, scar mobilization, vasopneumatic    PT Home Exercise Plan Access Code: [email or web address]    Consulted and Agree with Plan of Care Patient           Patient will benefit from skilled therapeutic intervention in order to improve the following deficits and impairments:  Abnormal gait, Decreased balance, Decreased endurance, Decreased mobility, Decreased range of motion, Decreased scar mobility, Decreased strength, Increased edema, Impaired flexibility, Pain  Visit Diagnosis: Stiffness of right ankle, not elsewhere classified  Localized edema  Pain in right ankle and joints of right foot  Other abnormalities of gait and mobility  Unsteadiness on feet  Muscle weakness (generalized)     Problem List Patient  Active Problem List   Diagnosis Date Noted   Trimalleolar fracture of ankle, closed, left, initial encounter    Trimalleolar fracture of ankle, closed, right, initial encounter    Routine general medical examination at a health care facility 02/13/2019   Gastroesophageal reflux disease without esophagitis 02/13/2019   Screening for cervical cancer 02/13/2019   Visit for screening mammogram 02/13/2019   Hyperglycemia 10/05/2017   Vitamin D deficiency disease 10/05/2017   Obesity 06/08/2016   DDD (degenerative disc disease), lumbar 05/21/2014   Essential hypertension 05/21/2014    Jamey Reas, PT, DPT 01/02/2020, 10:39 AM  [facility] Physical Therapy [address] [place], [place], [address] Phone: [phone]   Fax:  [phone]  Name: Brittay Mogle MRN: [id number] Date of Birth: 04/29/1960

## 2020-01-02 NOTE — Patient Instructions (Signed)
Access Code: [email or web address] URL: [email or web address] Date: 01/02/2020 Prepared by: Jamey Reas  Exercises Seated Toe Raise - 1 x daily - 7 x weekly - 2 sets - 10 reps - 5 seconds hold Seated Heel Raise - 1 x daily - 7 x weekly - 2 sets - 10 reps - 5 seconds hold Seated Toe Towel Scrunches - 1 x daily - 5 x weekly - 2 sets - 10 reps - 5 seconds hold Ankle Inversion Eversion Towel Slide - 1 x daily - 5 x weekly - 2 sets - 10 reps - 5 seconds hold

## 2020-01-05 ENCOUNTER — Encounter: Payer: Self-pay | Admitting: Physical Therapy

## 2020-01-05 ENCOUNTER — Ambulatory Visit (INDEPENDENT_AMBULATORY_CARE_PROVIDER_SITE_OTHER): Payer: BC Managed Care – PPO | Admitting: Physical Therapy

## 2020-01-05 ENCOUNTER — Other Ambulatory Visit: Payer: Self-pay

## 2020-01-05 DIAGNOSIS — R600 Localized edema: Secondary | ICD-10-CM

## 2020-01-05 DIAGNOSIS — M25571 Pain in right ankle and joints of right foot: Secondary | ICD-10-CM | POA: Diagnosis not present

## 2020-01-05 DIAGNOSIS — R2689 Other abnormalities of gait and mobility: Secondary | ICD-10-CM | POA: Diagnosis not present

## 2020-01-05 DIAGNOSIS — M6281 Muscle weakness (generalized): Secondary | ICD-10-CM

## 2020-01-05 DIAGNOSIS — R2681 Unsteadiness on feet: Secondary | ICD-10-CM

## 2020-01-05 DIAGNOSIS — M25671 Stiffness of right ankle, not elsewhere classified: Secondary | ICD-10-CM | POA: Diagnosis not present

## 2020-01-05 NOTE — Therapy (Signed)
[facility] Physical Therapy [address] [place], [place], [address] Phone: [phone]   Fax:  [phone]  Physical Therapy Treatment  Patient Details  Name: Jamie Jenkins MRN: [id number] Date of Birth: 1961-01-14 Referring Provider (PT): Bevely Palmer Persons, [place]   Encounter Date: 01/05/2020   PT End of Session - 01/05/20 1017    Visit Number 4    Number of Visits 18    Authorization Type BCBS Comm PPO    PT Start Time [phone]    PT Stop Time 1025    PT Time Calculation (min) 52 min    Equipment Utilized During Treatment Other (comment)   CAM boot   Activity Tolerance Patient limited by pain    Behavior During Therapy [facility] for tasks assessed/performed           Past Medical History:  Diagnosis Date  . Ankle fracture    right  . DDD (degenerative disc disease), lumbar   . Gall stones   . Hypertension     Past Surgical History:  Procedure Laterality Date  . ANKLE FUSION Left 07/21/2017   Procedure: SUBTALAR AND TALONAVICULAR FUSION LEFT FOOT;  Surgeon: Newt Minion, MD;  Location: [place];  Service: Orthopedics;  Laterality: Left;  . AXILLARY LYMPH NODE DISSECTION Left 11/24/2012   Procedure: incision and drainage axillary abscess;  Surgeon: Adin Hector, MD;  Location: [place];  Service: General;  Laterality: Left;  . BREAST SURGERY  2012   breast biopsy  . CHOLECYSTECTOMY    . ORIF ANKLE FRACTURE Right 11/22/2019   Procedure: OPEN REDUCTION INTERNAL FIXATION (ORIF) RIGHT ANKLE FRACTURE;  Surgeon: Newt Minion, MD;  Location: [place];  Service: Orthopedics;  Laterality: Right;  . TUBAL LIGATION      There were no vitals filed for this visit.   Subjective Assessment - 01/05/20 1013    Subjective relays 8/10 pain in her Rt ankle today    Pertinent History 11/22/2019 right ankle trimalleolar fx w/ORIF, left subtalar & talonavicular fusion 07/21/17, DDD, HTN    How long can you sit comfortably? 20 minutes    How long can you stand comfortably? 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 gentle grade 1-2 mobs for A-P and distraction      Ankle Exercises: Aerobic   Nustep seat 10 level 2 without boot using BLEs & BUEs 8 minutes      Ankle Exercises: Stretches   Gastroc Stretch 30 seconds;3 reps   seated with strap   Other Stretch inversion / eversion with towel assist seated with knee bent. 10 sec hold 10 reps ea      Ankle Exercises: Seated   Heel Raises 10 reps;Right  3 sets   Toe Raise 10 reps   3 sets   Other Seated Ankle Exercises rocker board 2.5 min lateral      Ankle Exercises: Standing   Other Standing Ankle Exercises with bilat UE support for weight shifting lateral 2 min then A-P with pregait heel rocking 2 min                    PT Short Term Goals - 12/26/19 1208      PT SHORT TERM GOAL #1   Title Patient demonstrates understanding of initial HEP.    Time 1    Period Months    Status New    Target Date 01/26/20      PT SHORT TERM GOAL #2   Title Patient reports right ankle pain </= 5/10 with non-weightbearing exercises.    Time 1    Period Months    Status New    Target Date 01/26/20             PT Long Term Goals - 12/26/19 1203      PT LONG TERM GOAL #1   Title Patient demonstrates & verbalizes understanding of ongoing HEP.    Time 2    Period  Months    Status New    Target Date 02/23/20      PT LONG TERM GOAL #2   Title FOTO score >/= 60% functional or <40% limited    Time 2    Period Months    Status New    Target Date 02/23/20      PT LONG TERM GOAL #3   Title right ankle range within 10* of left ankle range.    Time 2    Period Months    Status New    Target Date 02/23/20      PT LONG TERM GOAL #4   Title Patient ambulates >500' and negotiates ramps, curbs & stairs without device independently.    Time 2    Period Months    Status New    Target Date 02/23/20      PT LONG TERM GOAL #5   Title Right single leg stance >10seconds    Time 2    Period Months    Status New    Target Date 02/23/20      Additional Long Term Goals   Additional Long Term Goals Yes      PT LONG TERM GOAL #6   Title right ankle & foot pain </= 2/10 with standing & gait activities    Time 2    Period Months    Status New    Target Date 02/23/20                 Plan - 01/05/20 1021    Clinical Impression Statement Pt progressed NWB and WB as tolerated but she continues to have high pain levels and poor weight acceptance at this time. Pt encouraged her to add standing weight shifting exercise more at home with bilat UE support. PT wil continue to progress as able.    Personal Factors and Comorbidities Comorbidity 3+    Comorbidities left subtalar & talonavicular fusion 07/21/17, DDD, HTN    Examination-Activity Limitations Lift;Locomotion Level;Squat;Stairs;Sit;Stand    Examination-Participation Musician;Occupation    Stability/Clinical Decision Making Stable/Uncomplicated    Rehab Potential Good    PT Frequency 2x / week    PT Duration Other (comment)   9 weeks   PT  Treatment/Interventions ADLs/Self Care Home Management;Electrical Stimulation;Cryotherapy;DME Instruction;Gait training;Stair training;Contrast Bath;Functional mobility training;Therapeutic activities;Therapeutic exercise;Balance  training;Neuromuscular re-education;Patient/family education;Manual techniques;Scar mobilization;Passive range of motion;Dry needling;Vasopneumatic Device;Vestibular;Joint Manipulations    PT Next Visit Plan ankle range, weight acceptance as able, scar mobilization, vasopneumatic    PT Home Exercise Plan Access Code: [email or web address]    Consulted and Agree with Plan of Care Patient           Patient will benefit from skilled therapeutic intervention in order to improve the following deficits and impairments:  Abnormal gait, Decreased balance, Decreased endurance, Decreased mobility, Decreased range of motion, Decreased scar mobility, Decreased strength, Increased edema, Impaired flexibility, Pain  Visit Diagnosis: Stiffness of right ankle, not elsewhere classified  Localized edema  Pain in right ankle and joints of right foot  Other abnormalities of gait and mobility  Unsteadiness on feet  Muscle weakness (generalized)     Problem List Patient Active Problem List   Diagnosis Date Noted  . Trimalleolar fracture of ankle, closed, left, initial encounter   . Trimalleolar fracture of ankle, closed, right, initial encounter   . Routine general medical examination at a health care facility 02/13/2019  . Gastroesophageal reflux disease without esophagitis 02/13/2019  . Screening for cervical cancer 02/13/2019  . Visit for screening mammogram 02/13/2019  . Hyperglycemia 10/05/2017  . Vitamin D deficiency disease 10/05/2017  . Obesity 06/08/2016  . DDD (degenerative disc disease), lumbar 05/21/2014  . Essential hypertension 05/21/2014    Silvestre Mesi 01/05/2020, 10:26 AM  [facility] Physical Therapy [address] [place], [place], [address] Phone: [phone]   Fax:  [phone]  Name: Jamie Jenkins MRN: [id number] Date of Birth: 1961/01/11

## 2020-01-08 ENCOUNTER — Ambulatory Visit (INDEPENDENT_AMBULATORY_CARE_PROVIDER_SITE_OTHER): Payer: No Typology Code available for payment source

## 2020-01-08 ENCOUNTER — Other Ambulatory Visit: Payer: Self-pay

## 2020-01-08 ENCOUNTER — Encounter: Payer: Self-pay | Admitting: Physician Assistant

## 2020-01-08 ENCOUNTER — Ambulatory Visit (INDEPENDENT_AMBULATORY_CARE_PROVIDER_SITE_OTHER): Payer: BC Managed Care – PPO | Admitting: Physician Assistant

## 2020-01-08 DIAGNOSIS — Z8781 Personal history of (healed) traumatic fracture: Secondary | ICD-10-CM | POA: Diagnosis not present

## 2020-01-08 DIAGNOSIS — Z9889 Other specified postprocedural states: Secondary | ICD-10-CM

## 2020-01-08 DIAGNOSIS — S82852A Displaced trimalleolar fracture of left lower leg, initial encounter for closed fracture: Secondary | ICD-10-CM

## 2020-01-08 MED ORDER — OXYCODONE-ACETAMINOPHEN 5-325 MG PO TABS
1.0000 | ORAL_TABLET | Freq: Three times a day (TID) | ORAL | 0 refills | Status: DC | PRN
Start: 2020-01-08 — End: 2020-01-30

## 2020-01-08 NOTE — Progress Notes (Signed)
Office Visit Note   Patient: Jamie Jenkins           Date of Birth: 1960/04/30           MRN: [id number] Visit Date: 01/08/2020              Requested by: Janith Lima, MD [address] [place],  [place] [address] PCP: Janith Lima, MD  Chief Complaint  Patient presents with  . Right Ankle - Routine Post Op    ORIF on 11/22/19      HPI: Patient is 2 months status post open reduction internal fixation on her right ankle.  She has begun working with physical therapy.  She does have quite a bit of sensitivity on the outside of her ankle even to light touch.  She does feel better than a month ago and has begun not using her crutches for short distances around the house.  She is also starting to transition to a regular shoe.  She is wearing a compression sock.  Assessment & Plan: Visit Diagnoses:  1. Trimalleolar fracture of ankle, closed, left, initial encounter   2. S/P ORIF (open reduction internal fixation) fracture     Plan: Patient will follow up with 1 month.  She will continue to work with physical therapy and we wean out of the boot as tolerated.  I have provided her with 1 more prescription for oxycodone which she states she only uses occasionally at night  Follow-Up Instructions: No follow-ups on file.   Ortho Exam  Patient is alert, oriented, no adenopathy, well-dressed, normal affect, normal respiratory effort. Right ankle: Well-healed surgical incision mild keloid scarring.  Mild soft tissue swelling.  She does have some skin exfoliation.  No drainage no cellulitis she does have a to have tenderness to light touch over the lateral incision none over the medial incision.  Compartments are soft and compressible negative Homans' sign  Imaging: No results found. No images are attached to the encounter.  Labs: Lab Results  Component Value Date   HGBA1C 5.5 02/13/2019   HGBA1C 5.6 10/05/2017   HGBA1C 5.5 06/23/2016     Lab Results  Component Value  Date   ALBUMIN 3.7 10/05/2017   ALBUMIN 3.9 07/16/2017   ALBUMIN 4.3 06/23/2016    No results found for: MG Lab Results  Component Value Date   VD25OH 20.39 (L) 02/13/2019   VD25OH 7.43 (L) 10/05/2017   VD25OH 15.87 (L) 06/23/2016    No results found for: PREALBUMIN CBC EXTENDED Latest Ref Rng & Units 11/22/2019 02/13/2019 10/05/2017  WBC 4.0 - 10.5 K/uL 7.5 6.6 5.7  RBC 3.87 - 5.11 MIL/uL 4.38 4.49 3.94  HGB 12.0 - 15.0 g/dL 14.8 14.6 13.3  HCT 36 - 46 % 44.6 43.2 39.6  PLT 150 - 400 K/uL 251 249.0 238.0  NEUTROABS 1.4 - 7.7 K/uL - 3.6 2.9  LYMPHSABS 0.7 - 4.0 K/uL - 2.5 2.4     There is no height or weight on file to calculate BMI.  Orders:  Orders Placed This Encounter  Procedures  . XR Ankle Complete Right   Meds ordered this encounter  Medications  . oxyCODONE-acetaminophen (PERCOCET) 5-325 MG tablet    Sig: Take 1 tablet by mouth every 8 (eight) hours as needed.    Dispense:  30 tablet    Refill:  0     Procedures: No procedures performed  Clinical Data: No additional findings.  ROS:  All other systems negative,  except as noted in the HPI. Review of Systems  Objective: Vital Signs: LMP 08/23/2012   Specialty Comments:  No specialty comments available.  PMFS History: Patient Active Problem List   Diagnosis Date Noted  . Trimalleolar fracture of ankle, closed, left, initial encounter   . Trimalleolar fracture of ankle, closed, right, initial encounter   . Routine general medical examination at a health care facility 02/13/2019  . Gastroesophageal reflux disease without esophagitis 02/13/2019  . Screening for cervical cancer 02/13/2019  . Visit for screening mammogram 02/13/2019  . Hyperglycemia 10/05/2017  . Vitamin D deficiency disease 10/05/2017  . Obesity 06/08/2016  . DDD (degenerative disc disease), lumbar 05/21/2014  . Essential hypertension 05/21/2014   Past Medical History:  Diagnosis Date  . Ankle fracture    right  . DDD  (degenerative disc disease), lumbar   . Gall stones   . Hypertension     Family History  Problem Relation Age of Onset  . Pancreatic cancer Mother        Deceased  . Hypertension Father   . Deep vein thrombosis Brother        Deceased  . Healthy Sister   . Healthy Son   . Healthy Daughter     Past Surgical History:  Procedure Laterality Date  . ANKLE FUSION Left 07/21/2017   Procedure: SUBTALAR AND TALONAVICULAR FUSION LEFT FOOT;  Surgeon: Newt Minion, MD;  Location: [place];  Service: Orthopedics;  Laterality: Left;  . AXILLARY LYMPH NODE DISSECTION Left 11/24/2012   Procedure: incision and drainage axillary abscess;  Surgeon: Adin Hector, MD;  Location: [place];  Service: General;  Laterality: Left;  . BREAST SURGERY  2012   breast biopsy  . CHOLECYSTECTOMY    . ORIF ANKLE FRACTURE Right 11/22/2019   Procedure: OPEN REDUCTION INTERNAL FIXATION (ORIF) RIGHT ANKLE FRACTURE;  Surgeon: Newt Minion, MD;  Location: [place];  Service: Orthopedics;  Laterality: Right;  . TUBAL LIGATION     Social History   Occupational History  . Occupation: Scientist, research (medical)  Tobacco Use  . Smoking status: Never Smoker  . Smokeless tobacco: Never Used  Vaping Use  . Vaping Use: Never used  Substance and Sexual Activity  . Alcohol use: Yes    Alcohol/week: 0.0 standard drinks    Comment: occasional  . Drug use: No  . Sexual activity: Not on file

## 2020-01-09 ENCOUNTER — Encounter: Payer: Self-pay | Admitting: Physical Therapy

## 2020-01-09 ENCOUNTER — Ambulatory Visit (INDEPENDENT_AMBULATORY_CARE_PROVIDER_SITE_OTHER): Payer: BC Managed Care – PPO | Admitting: Physical Therapy

## 2020-01-09 DIAGNOSIS — R2689 Other abnormalities of gait and mobility: Secondary | ICD-10-CM | POA: Diagnosis not present

## 2020-01-09 DIAGNOSIS — M6281 Muscle weakness (generalized): Secondary | ICD-10-CM

## 2020-01-09 DIAGNOSIS — M25671 Stiffness of right ankle, not elsewhere classified: Secondary | ICD-10-CM

## 2020-01-09 DIAGNOSIS — R600 Localized edema: Secondary | ICD-10-CM

## 2020-01-09 DIAGNOSIS — R2681 Unsteadiness on feet: Secondary | ICD-10-CM

## 2020-01-09 DIAGNOSIS — M25571 Pain in right ankle and joints of right foot: Secondary | ICD-10-CM | POA: Diagnosis not present

## 2020-01-09 NOTE — Therapy (Signed)
[facility] Physical Therapy [address] [place], [place], [address] Phone: [phone]   Fax:  [phone]  Physical Therapy Treatment  Patient Details  Name: Jamie Jenkins MRN: [id number] Date of Birth: 1960-08-24 Referring Provider (PT): Bevely Palmer Persons, [place]   Encounter Date: 01/09/2020   PT End of Session - 01/09/20 7782    Visit Number 5    Number of Visits 18    Authorization Type [employer] Comm PPO    PT Start Time 0930    PT Stop Time 1020    PT Time Calculation (min) 50 min    Equipment Utilized During Treatment Other (comment)   CAM boot   Activity Tolerance Patient limited by pain    Behavior During Therapy [facility] for tasks assessed/performed           Past Medical History:  Diagnosis Date  . Ankle fracture    right  . DDD (degenerative disc disease), lumbar   . Gall stones   . Hypertension     Past Surgical History:  Procedure Laterality Date  . ANKLE FUSION Left 07/21/2017   Procedure: SUBTALAR AND TALONAVICULAR FUSION LEFT FOOT;  Surgeon: Newt Minion, MD;  Location: [place];  Service: Orthopedics;  Laterality: Left;  . AXILLARY LYMPH NODE DISSECTION Left 11/24/2012   Procedure: incision and drainage axillary abscess;  Surgeon: Adin Hector, MD;  Location: [place];  Service: General;  Laterality: Left;  . BREAST SURGERY  2012   breast biopsy  . CHOLECYSTECTOMY    . ORIF ANKLE FRACTURE Right 11/22/2019   Procedure: OPEN REDUCTION INTERNAL FIXATION (ORIF) RIGHT ANKLE FRACTURE;  Surgeon: Newt Minion, MD;  Location: [place];  Service: Orthopedics;  Laterality: Right;  . TUBAL LIGATION      There were no vitals filed for this visit.   Subjective Assessment - 01/09/20 0930    Subjective She has been doing her exercises. She has been placing 1" lift under left foot when standing with boot to level pelvis. She is able to stand longer. The PA said she could start standing without boot but to pain tolerance.    Pertinent History 11/22/2019  right ankle trimalleolar fx w/ORIF, left subtalar & talonavicular fusion 07/21/17, DDD, HTN    How long can you sit comfortably? 20 minutes    How long can you stand comfortably? 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 Yes    Pain Score 7     Pain Location Ankle    Pain Orientation Right    Pain Descriptors / Indicators Aching    Pain Type Surgical pain;Acute pain    Pain Onset More than a month ago    Pain Frequency Constant    Aggravating Factors  standing & walking    Pain Relieving Factors medication & ice    Effect of Pain on Daily Activities woke her up ~4am.                             [facility] Adult PT Treatment/Exercise - 01/09/20 0930      Ambulation/Gait   Ambulation/Gait Yes    Ambulation/Gait Assistance 5: Supervision    Ambulation/Gait Assistance Details 3-pt pattern with RLE in shoe bw crutches with PWB. PT demo & verbal cues on technique    Ambulation Distance (Feet) 100  Feet    Assistive device Crutches    Ambulation Surface Level;Indoor      Self-Care   Self-Care ADL's    ADL's using pillows to "tent" sheets off foot & between knees in sidelying.     Other Self-Care Comments  using pain in RLE as guide for standing & gait without boot.  Use crutches when out of boot. When pain increases >2 increments & not relieved by sitting, then resume boot wear for 2hrs.       Vasopneumatic   Number Minutes Vasopneumatic  10 minutes    Vasopnuematic Location  Ankle    Vasopneumatic Pressure Medium    Vasopneumatic Temperature  34      Manual Therapy   Manual therapy comments Rt ankle PROM all planes to toleranc. gentle grade 1-2 mobs for A-P and distraction      Ankle Exercises: Aerobic   Nustep seat 10 level 2 without boot using BLEs & BUEs 8 minutes      Ankle Exercises: Stretches   Gastroc Stretch 30 seconds;3 reps    seated with strap   Other Stretch inversion / eversion with towel assist seated with knee bent. 10 sec hold 10 reps ea      Ankle Exercises: Standing   Other Standing Ankle Exercises with bilat UE support for weight shifting lateral 2 min then A-P with pregait heel rocking 2 min      Ankle Exercises: Seated   Heel Raises 10 reps;Right   3 sets, pt w/ light hand resistance on knee   Toe Raise 10 reps   3 sets   BAPS Sitting;Level 2;10 reps   2 sets   BAPS Limitations ant/post, med/lat and circles    Other Seated Ankle Exercises --                    PT Short Term Goals - 12/26/19 1208      PT SHORT TERM GOAL #1   Title Patient demonstrates understanding of initial HEP.    Time 1    Period Months    Status New    Target Date 01/26/20      PT SHORT TERM GOAL #2   Title Patient reports right ankle pain </= 5/10 with non-weightbearing exercises.    Time 1    Period Months    Status New    Target Date 01/26/20             PT Long Term Goals - 12/26/19 1203      PT LONG TERM GOAL #1   Title Patient demonstrates & verbalizes understanding of ongoing HEP.    Time 2    Period Months    Status New    Target Date 02/23/20      PT LONG TERM GOAL #2   Title FOTO score >/= 60% functional or <40% limited    Time 2    Period Months    Status New    Target Date 02/23/20      PT LONG TERM GOAL #3   Title right ankle range within 10* of left ankle range.    Time 2    Period Months    Status New    Target Date 02/23/20      PT LONG TERM GOAL #4   Title Patient ambulates >500' and negotiates ramps, curbs & stairs without device independently.    Time 2    Period Months    Status New  Target Date 02/23/20      PT LONG TERM GOAL #5   Title Right single leg stance >10seconds    Time 2    Period Months    Status New    Target Date 02/23/20      Additional Long Term Goals   Additional Long Term Goals Yes      PT LONG TERM GOAL #6   Title right ankle &  foot pain </= 2/10 with standing & gait activities    Time 2    Period Months    Status New    Target Date 02/23/20                 Plan - 01/09/20 8144    Clinical Impression Statement Patient able to begin to wean walking boot per PA notes.  PT initiated limited distance gait with PWB on RLE using crutches without significant pain issues.    Personal Factors and Comorbidities Comorbidity 3+    Comorbidities left subtalar & talonavicular fusion 07/21/17, DDD, HTN    Examination-Activity Limitations Lift;Locomotion Level;Squat;Stairs;Sit;Stand    Examination-Participation Musician;Occupation    Stability/Clinical Decision Making Stable/Uncomplicated    Rehab Potential Good    PT Frequency 2x / week    PT Duration Other (comment)   9 weeks   PT Treatment/Interventions ADLs/Self Care Home Management;Electrical Stimulation;Cryotherapy;DME Instruction;Gait training;Stair training;Contrast Bath;Functional mobility training;Therapeutic activities;Therapeutic exercise;Balance training;Neuromuscular re-education;Patient/family education;Manual techniques;Scar mobilization;Passive range of motion;Dry needling;Vasopneumatic Device;Vestibular;Joint Manipulations    PT Next Visit Plan ankle range, standing & gait with crutches PWB/WBAT without CAM walking boot,   vasopneumatic    PT Home Exercise Plan Access Code: [email or web address]    Consulted and Agree with Plan of Care Patient           Patient will benefit from skilled therapeutic intervention in order to improve the following deficits and impairments:  Abnormal gait, Decreased balance, Decreased endurance, Decreased mobility, Decreased range of motion, Decreased scar mobility, Decreased strength, Increased edema, Impaired flexibility, Pain  Visit Diagnosis: Stiffness of right ankle, not elsewhere classified  Localized edema  Pain in right ankle and joints of right foot  Other abnormalities of gait and  mobility  Unsteadiness on feet  Muscle weakness (generalized)     Problem List Patient Active Problem List   Diagnosis Date Noted  . Trimalleolar fracture of ankle, closed, left, initial encounter   . Trimalleolar fracture of ankle, closed, right, initial encounter   . Routine general medical examination at a health care facility 02/13/2019  . Gastroesophageal reflux disease without esophagitis 02/13/2019  . Screening for cervical cancer 02/13/2019  . Visit for screening mammogram 02/13/2019  . Hyperglycemia 10/05/2017  . Vitamin D deficiency disease 10/05/2017  . Obesity 06/08/2016  . DDD (degenerative disc disease), lumbar 05/21/2014  . Essential hypertension 05/21/2014    Jamey Reas, PT, DPT 01/09/2020, 1:37 PM  [facility] Physical Therapy [address] [place], [place], [address] Phone: [phone]   Fax:  [phone]  Name: Jamie Jenkins MRN: [id number] Date of Birth: 11/16/1960

## 2020-01-10 ENCOUNTER — Ambulatory Visit: Payer: BC Managed Care – PPO | Admitting: Physician Assistant

## 2020-01-11 ENCOUNTER — Encounter: Payer: Self-pay | Admitting: Physical Therapy

## 2020-01-11 ENCOUNTER — Other Ambulatory Visit: Payer: Self-pay

## 2020-01-11 ENCOUNTER — Ambulatory Visit (INDEPENDENT_AMBULATORY_CARE_PROVIDER_SITE_OTHER): Payer: BC Managed Care – PPO | Admitting: Physical Therapy

## 2020-01-11 DIAGNOSIS — M25571 Pain in right ankle and joints of right foot: Secondary | ICD-10-CM | POA: Diagnosis not present

## 2020-01-11 DIAGNOSIS — R600 Localized edema: Secondary | ICD-10-CM | POA: Diagnosis not present

## 2020-01-11 DIAGNOSIS — R2689 Other abnormalities of gait and mobility: Secondary | ICD-10-CM

## 2020-01-11 DIAGNOSIS — M25671 Stiffness of right ankle, not elsewhere classified: Secondary | ICD-10-CM | POA: Diagnosis not present

## 2020-01-11 DIAGNOSIS — M6281 Muscle weakness (generalized): Secondary | ICD-10-CM

## 2020-01-11 DIAGNOSIS — R2681 Unsteadiness on feet: Secondary | ICD-10-CM

## 2020-01-11 NOTE — Therapy (Signed)
[facility] Physical Therapy [address] [place], [place], [address] Phone: [phone]   Fax:  [phone]  Physical Therapy Treatment  Patient Details  Name: Jamie Jenkins MRN: [id number] Date of Birth: Apr 27, 1960 Referring Provider (PT): Bevely Palmer Persons, [place]   Encounter Date: 01/11/2020   PT End of Session - 01/11/20 0936    Visit Number 6    Number of Visits 18    Authorization Type [employer] Comm PPO    PT Start Time 0930    PT Stop Time 1025    PT Time Calculation (min) 55 min    Equipment Utilized During Treatment Other (comment)   CAM boot   Activity Tolerance Patient limited by pain    Behavior During Therapy [facility] for tasks assessed/performed           Past Medical History:  Diagnosis Date  . Ankle fracture    right  . DDD (degenerative disc disease), lumbar   . Gall stones   . Hypertension     Past Surgical History:  Procedure Laterality Date  . ANKLE FUSION Left 07/21/2017   Procedure: SUBTALAR AND TALONAVICULAR FUSION LEFT FOOT;  Surgeon: Newt Minion, MD;  Location: [place];  Service: Orthopedics;  Laterality: Left;  . AXILLARY LYMPH NODE DISSECTION Left 11/24/2012   Procedure: incision and drainage axillary abscess;  Surgeon: Adin Hector, MD;  Location: [place];  Service: General;  Laterality: Left;  . BREAST SURGERY  2012   breast biopsy  . CHOLECYSTECTOMY    . ORIF ANKLE FRACTURE Right 11/22/2019   Procedure: OPEN REDUCTION INTERNAL FIXATION (ORIF) RIGHT ANKLE FRACTURE;  Surgeon: Newt Minion, MD;  Location: [place];  Service: Orthopedics;  Laterality: Right;  . TUBAL LIGATION      There were no vitals filed for this visit.   Subjective Assessment - 01/11/20 0930    Subjective She was able to walk in house with crutches & shoe, no boot, for 20 minutes.  She did try pillows in bed as PT recommended and helped some but she still woke up when pain meds wore off.    Pertinent History 11/22/2019 right ankle trimalleolar fx w/ORIF, left  subtalar & talonavicular fusion 07/21/17, DDD, HTN    How long can you sit comfortably? 20 minutes    How long can you stand comfortably? 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 Yes    Pain Score 7     Pain Location Ankle    Pain Orientation Right;Posterior    Pain Descriptors / Indicators Aching;Throbbing    Pain Type Acute pain;Surgical pain    Pain Onset More than a month ago    Pain Frequency Constant    Aggravating Factors  standing / walking and stretching ankle    Pain Relieving Factors meds & ice                             [facility] Adult PT Treatment/Exercise - 01/11/20 0930      Ambulation/Gait   Ambulation/Gait Yes    Ambulation/Gait Assistance 5: Supervision    Ambulation Distance (Feet) 100 Feet    Assistive device Crutches      Self-Care   Self-Care --    ADL's --    Other Self-Care Comments  --  Vasopneumatic   Number Minutes Vasopneumatic  10 minutes    Vasopnuematic Location  Ankle    Vasopneumatic Pressure High    Vasopneumatic Temperature  34      Manual Therapy   Manual therapy comments Rt ankle PROM all planes to toleranc. gentle grade 1-2 mobs for A-P and distraction      Ankle Exercises: Aerobic   Nustep seat 10 level 3 without boot using BLEs & BUEs 8 minutes      Ankle Exercises: Stretches   Gastroc Stretch 30 seconds;3 reps   seated with strap   Other Stretch inversion / eversion with towel assist seated with knee bent. 10 sec hold 10 reps ea      Ankle Exercises: Standing   Other Standing Ankle Exercises with bilat UE support for weight shifting lateral 2 min then A-P with pregait heel rocking 2 min      Ankle Exercises: Seated   Heel Raises 10 reps;Right   3 sets, pt w/ light hand resistance on knee   Toe Raise 10 reps   3 sets   BAPS Sitting;Level 2;10 reps   2 sets   BAPS Limitations  ant/post, med/lat and circles                  PT Education - 01/11/20 0956    Education Details updated HEP Medbridge Access Code: [email or web address]   Recommended walking with shoe, no boot using crutches for 20-30 minutes 3-4 x/day    Person(s) Educated Patient    Methods Explanation;Demonstration;Tactile cues;Verbal cues;Handout    Comprehension Verbalized understanding;Returned demonstration;Verbal cues required;Tactile cues required            PT Short Term Goals - 12/26/19 1208      PT SHORT TERM GOAL #1   Title Patient demonstrates understanding of initial HEP.    Time 1    Period Months    Status New    Target Date 01/26/20      PT SHORT TERM GOAL #2   Title Patient reports right ankle pain </= 5/10 with non-weightbearing exercises.    Time 1    Period Months    Status New    Target Date 01/26/20             PT Long Term Goals - 12/26/19 1203      PT LONG TERM GOAL #1   Title Patient demonstrates & verbalizes understanding of ongoing HEP.    Time 2    Period Months    Status New    Target Date 02/23/20      PT LONG TERM GOAL #2   Title FOTO score >/= 60% functional or <40% limited    Time 2    Period Months    Status New    Target Date 02/23/20      PT LONG TERM GOAL #3   Title right ankle range within 10* of left ankle range.    Time 2    Period Months    Status New    Target Date 02/23/20      PT LONG TERM GOAL #4   Title Patient ambulates >500' and negotiates ramps, curbs & stairs without device independently.    Time 2    Period Months    Status New    Target Date 02/23/20      PT LONG TERM GOAL #5   Title Right single leg stance >10seconds    Time 2    Period Months  Status New    Target Date 02/23/20      Additional Long Term Goals   Additional Long Term Goals Yes      PT LONG TERM GOAL #6   Title right ankle & foot pain </= 2/10 with standing & gait activities    Time 2    Period Months    Status New    Target Date  02/23/20                 Plan - 01/11/20 0936    Clinical Impression Statement PT updated HEP seated exercises and walking with shoe, no boot, with crutches WBAT 20-30 minutes 3-4 x/day. She appears to have an understanding.  Pt has pain with standing weight shift activities that appears is not ready yet to advance standing activities.    Personal Factors and Comorbidities Comorbidity 3+    Comorbidities left subtalar & talonavicular fusion 07/21/17, DDD, HTN    Examination-Activity Limitations Lift;Locomotion Level;Squat;Stairs;Sit;Stand    Examination-Participation Musician;Occupation    Stability/Clinical Decision Making Stable/Uncomplicated    Rehab Potential Good    PT Frequency 2x / week    PT Duration Other (comment)   9 weeks   PT Treatment/Interventions ADLs/Self Care Home Management;Electrical Stimulation;Cryotherapy;DME Instruction;Gait training;Stair training;Contrast Bath;Functional mobility training;Therapeutic activities;Therapeutic exercise;Balance training;Neuromuscular re-education;Patient/family education;Manual techniques;Scar mobilization;Passive range of motion;Dry needling;Vasopneumatic Device;Vestibular;Joint Manipulations    PT Next Visit Plan ankle range, standing & gait with crutches PWB/WBAT without CAM walking boot,   vasopneumatic    PT Home Exercise Plan Access Code: [email or web address]    Consulted and Agree with Plan of Care Patient           Patient will benefit from skilled therapeutic intervention in order to improve the following deficits and impairments:  Abnormal gait, Decreased balance, Decreased endurance, Decreased mobility, Decreased range of motion, Decreased scar mobility, Decreased strength, Increased edema, Impaired flexibility, Pain  Visit Diagnosis: Stiffness of right ankle, not elsewhere classified  Localized edema  Pain in right ankle and joints of right foot  Other abnormalities of gait and  mobility  Unsteadiness on feet  Muscle weakness (generalized)     Problem List Patient Active Problem List   Diagnosis Date Noted  . Trimalleolar fracture of ankle, closed, left, initial encounter   . Trimalleolar fracture of ankle, closed, right, initial encounter   . Routine general medical examination at a health care facility 02/13/2019  . Gastroesophageal reflux disease without esophagitis 02/13/2019  . Screening for cervical cancer 02/13/2019  . Visit for screening mammogram 02/13/2019  . Hyperglycemia 10/05/2017  . Vitamin D deficiency disease 10/05/2017  . Obesity 06/08/2016  . DDD (degenerative disc disease), lumbar 05/21/2014  . Essential hypertension 05/21/2014    Jamey Reas PT, DPT 01/11/2020, 10:42 AM  [facility] Physical Therapy [address] [place], [place], [address] Phone: [phone]   Fax:  [phone]  Name: Jamie Jenkins MRN: [id number] Date of Birth: June 04, 1960

## 2020-01-11 NOTE — Patient Instructions (Signed)
Access Code: [email or web address] URL: [email or web address] Date: 01/11/2020 Prepared by: Jamey Reas  Exercises Seated Toe Raise - 1 x daily - 7 x weekly - 2 sets - 10 reps - 5 seconds hold Seated Heel Raise - 1 x daily - 7 x weekly - 2 sets - 10 reps - 5 seconds hold Seated Toe Towel Scrunches - 1 x daily - 7 x weekly - 2 sets - 10 reps - 5 seconds hold Ankle Inversion Eversion Towel Slide - 1 x daily - 7 x weekly - 2 sets - 10 reps - 5 seconds hold Long Sitting Ankle PROM Inversion Eversion - 3 x daily - 7 x weekly - 2 sets - 10 reps - 5 seconds hold Seated Calf Stretch with Strap - 3 x daily - 7 x weekly - 1 sets - 3 reps - 20-30 seconds hold Seated Soleus Stretch with Strap - 3 x daily - 7 x weekly - 1 sets - 3 reps - 20-30 seconds hold Seated Ankle Plantarflexion AROM - 1 x daily - 7 x weekly - 2 sets - 10 reps - 5 seconds hold

## 2020-01-15 ENCOUNTER — Ambulatory Visit (INDEPENDENT_AMBULATORY_CARE_PROVIDER_SITE_OTHER): Payer: BC Managed Care – PPO | Admitting: Physical Therapy

## 2020-01-15 ENCOUNTER — Other Ambulatory Visit: Payer: Self-pay

## 2020-01-15 DIAGNOSIS — M6281 Muscle weakness (generalized): Secondary | ICD-10-CM

## 2020-01-15 DIAGNOSIS — M25571 Pain in right ankle and joints of right foot: Secondary | ICD-10-CM | POA: Diagnosis not present

## 2020-01-15 DIAGNOSIS — R2689 Other abnormalities of gait and mobility: Secondary | ICD-10-CM

## 2020-01-15 DIAGNOSIS — R600 Localized edema: Secondary | ICD-10-CM

## 2020-01-15 DIAGNOSIS — M25671 Stiffness of right ankle, not elsewhere classified: Secondary | ICD-10-CM

## 2020-01-15 DIAGNOSIS — R2681 Unsteadiness on feet: Secondary | ICD-10-CM

## 2020-01-15 NOTE — Therapy (Signed)
[facility] Physical Therapy [address] [place], [place], [address] Phone: [phone]   Fax:  [phone]  Physical Therapy Treatment  Patient Details  Name: Jamie Jenkins MRN: [id number] Date of Birth: 19-Dec-1960 Referring Provider (PT): Bevely Palmer Persons, [place]   Encounter Date: 01/15/2020   PT End of Session - 01/15/20 1148    Visit Number 7    Number of Visits 18    Authorization Type [employer] Comm PPO    PT Start Time 1017    PT Stop Time 1100    PT Time Calculation (min) 43 min    Equipment Utilized During Treatment Other (comment)   CAM boot   Activity Tolerance Patient limited by pain    Behavior During Therapy [facility] for tasks assessed/performed           Past Medical History:  Diagnosis Date   Ankle fracture    right   DDD (degenerative disc disease), lumbar    Gall stones    Hypertension     Past Surgical History:  Procedure Laterality Date   ANKLE FUSION Left 07/21/2017   Procedure: SUBTALAR AND TALONAVICULAR FUSION LEFT FOOT;  Surgeon: Newt Minion, MD;  Location: [place];  Service: Orthopedics;  Laterality: Left;   AXILLARY LYMPH NODE DISSECTION Left 11/24/2012   Procedure: incision and drainage axillary abscess;  Surgeon: Adin Hector, MD;  Location: [place];  Service: General;  Laterality: Left;   BREAST SURGERY  2012   breast biopsy   CHOLECYSTECTOMY     ORIF ANKLE FRACTURE Right 11/22/2019   Procedure: OPEN REDUCTION INTERNAL FIXATION (ORIF) RIGHT ANKLE FRACTURE;  Surgeon: Newt Minion, MD;  Location: [place];  Service: Orthopedics;  Laterality: Right;   TUBAL LIGATION      There were no vitals filed for this visit.   Subjective Assessment - 01/15/20 1140    Subjective relays 5/10 overall Rt ankle pain today    Pertinent History 11/22/2019 right ankle trimalleolar fx w/ORIF, left subtalar & talonavicular fusion 07/21/17, DDD, HTN    How long can you sit comfortably? 20 minutes    How long can you stand comfortably? 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 gentle grade 1-2 mobs for A-P and distraction      Ankle Exercises: Aerobic   Recumbent Bike L2 X 6 min      Ankle Exercises: Stretches   Gastroc Stretch 30 seconds;3 reps   seated with strap     Ankle Exercises: Standing   Other Standing Ankle Exercises in bars up/down 2 trips ea for  sidestepping, retro walking, and fwd walking      Ankle Exercises: Seated   Heel Raises 20 reps   manual resitance pushing down on knees   Toe Raise 20 reps    Other Seated Ankle Exercises rocker board 2 min A-P, 2 min lateral                     PT Short Term Goals - 12/26/19 1208      PT SHORT TERM GOAL #1   Title Patient demonstrates understanding of initial HEP.    Time 1    Period Months    Status New    Target Date 01/26/20      PT SHORT TERM GOAL #2   Title Patient reports right ankle pain </= 5/10 with non-weightbearing exercises.    Time 1    Period Months    Status New    Target Date 01/26/20             PT Long Term Goals - 12/26/19 1203      PT LONG TERM GOAL #1   Title Patient demonstrates & verbalizes understanding of ongoing HEP.    Time 2    Period Months    Status New    Target Date 02/23/20      PT LONG TERM GOAL  #2   Title FOTO score >/= 60% functional or <40% limited    Time 2    Period Months    Status New    Target Date 02/23/20      PT LONG TERM GOAL #3   Title right ankle range within 10* of left ankle range.    Time 2    Period Months    Status New    Target Date 02/23/20      PT LONG TERM GOAL #4   Title Patient ambulates >500' and negotiates ramps, curbs & stairs without device independently.    Time 2    Period Months    Status New    Target Date 02/23/20      PT LONG TERM GOAL #5   Title Right single leg stance >10seconds    Time 2    Period Months    Status New    Target Date 02/23/20      Additional Long Term Goals   Additional Long Term Goals Yes      PT LONG TERM GOAL #6   Title right ankle & foot pain </= 2/10 with standing & gait activities    Time 2    Period Months    Status New    Target Date 02/23/20                 Plan - 01/15/20 1150    Clinical Impression Statement She was able to tolerate more weight bearing on her Rt ankle today and even ambulated 20 ft without AD with SBA. PT will continue to progress this and her overall strength and ROM as tolerated.    Personal Factors and Comorbidities Comorbidity 3+    Comorbidities left subtalar & talonavicular fusion 07/21/17, DDD, HTN    Examination-Activity Limitations Lift;Locomotion Level;Squat;Stairs;Sit;Stand    Examination-Participation Musician;Occupation    Stability/Clinical Decision Making Stable/Uncomplicated    Rehab Potential Good    PT Frequency 2x / week    PT Duration Other (comment)   9 weeks   PT Treatment/Interventions ADLs/Self Care Home Management;Electrical Stimulation;Cryotherapy;DME Instruction;Gait training;Stair training;Contrast Bath;Functional mobility training;Therapeutic activities;Therapeutic exercise;Balance  training;Neuromuscular re-education;Patient/family education;Manual techniques;Scar mobilization;Passive range of motion;Dry  needling;Vasopneumatic Device;Vestibular;Joint Manipulations    PT Next Visit Plan ankle range, standing & gait with crutches PWB/WBAT without CAM walking boot,   vasopneumatic    PT Home Exercise Plan Access Code: [email or web address]    Consulted and Agree with Plan of Care Patient           Patient will benefit from skilled therapeutic intervention in order to improve the following deficits and impairments:  Abnormal gait, Decreased balance, Decreased endurance, Decreased mobility, Decreased range of motion, Decreased scar mobility, Decreased strength, Increased edema, Impaired flexibility, Pain  Visit Diagnosis: Stiffness of right ankle, not elsewhere classified  Localized edema  Pain in right ankle and joints of right foot  Other abnormalities of gait and mobility  Unsteadiness on feet  Muscle weakness (generalized)     Problem List Patient Active Problem List   Diagnosis Date Noted   Trimalleolar fracture of ankle, closed, left, initial encounter    Trimalleolar fracture of ankle, closed, right, initial encounter    Routine general medical examination at a health care facility 02/13/2019   Gastroesophageal reflux disease without esophagitis 02/13/2019   Screening for cervical cancer 02/13/2019   Visit for screening mammogram 02/13/2019   Hyperglycemia 10/05/2017   Vitamin D deficiency disease 10/05/2017   Obesity 06/08/2016   DDD (degenerative disc disease), lumbar 05/21/2014   Essential hypertension 05/21/2014    Silvestre Mesi 01/15/2020, 11:52 AM  [facility] Physical Therapy [address] [place], [place], [address] Phone: [phone]   Fax:  [phone]  Name: Febe Champa MRN: [id number] Date of Birth: 08/14/1960

## 2020-01-17 ENCOUNTER — Ambulatory Visit (INDEPENDENT_AMBULATORY_CARE_PROVIDER_SITE_OTHER): Payer: BC Managed Care – PPO | Admitting: Physical Therapy

## 2020-01-17 ENCOUNTER — Other Ambulatory Visit: Payer: Self-pay

## 2020-01-17 ENCOUNTER — Encounter: Payer: Self-pay | Admitting: Physical Therapy

## 2020-01-17 DIAGNOSIS — R2689 Other abnormalities of gait and mobility: Secondary | ICD-10-CM | POA: Diagnosis not present

## 2020-01-17 DIAGNOSIS — R600 Localized edema: Secondary | ICD-10-CM | POA: Diagnosis not present

## 2020-01-17 DIAGNOSIS — M25571 Pain in right ankle and joints of right foot: Secondary | ICD-10-CM

## 2020-01-17 DIAGNOSIS — M25671 Stiffness of right ankle, not elsewhere classified: Secondary | ICD-10-CM

## 2020-01-17 DIAGNOSIS — M6281 Muscle weakness (generalized): Secondary | ICD-10-CM

## 2020-01-17 DIAGNOSIS — R2681 Unsteadiness on feet: Secondary | ICD-10-CM

## 2020-01-17 NOTE — Therapy (Signed)
[facility] Physical Therapy [address] [place], [place], [address] Phone: [phone]   Fax:  [phone]  Physical Therapy Treatment  Patient Details  Name: Jamie Jenkins MRN: [id number] Date of Birth: 08/01/1960 Referring Provider (PT): Bevely Palmer Persons, [place]   Encounter Date: 01/17/2020   PT End of Session - 01/17/20 1153    Visit Number 8    Number of Visits 18    Authorization Type [employer] Comm PPO    PT Start Time 1100    PT Stop Time 1145    PT Time Calculation (min) 45 min    Equipment Utilized During Treatment Other (comment)   CAM boot   Activity Tolerance Patient limited by pain    Behavior During Therapy [facility] for tasks assessed/performed           Past Medical History:  Diagnosis Date   Ankle fracture    right   DDD (degenerative disc disease), lumbar    Gall stones    Hypertension     Past Surgical History:  Procedure Laterality Date   ANKLE FUSION Left 07/21/2017   Procedure: SUBTALAR AND TALONAVICULAR FUSION LEFT FOOT;  Surgeon: Newt Minion, MD;  Location: [place];  Service: Orthopedics;  Laterality: Left;   AXILLARY LYMPH NODE DISSECTION Left 11/24/2012   Procedure: incision and drainage axillary abscess;  Surgeon: Adin Hector, MD;  Location: [place];  Service: General;  Laterality: Left;   BREAST SURGERY  2012   breast biopsy   CHOLECYSTECTOMY     ORIF ANKLE FRACTURE Right 11/22/2019   Procedure: OPEN REDUCTION INTERNAL FIXATION (ORIF) RIGHT ANKLE FRACTURE;  Surgeon: Newt Minion, MD;  Location: [place];  Service: Orthopedics;  Laterality: Right;   TUBAL LIGATION      There were no vitals filed for this visit.   Subjective Assessment - 01/17/20 1151    Subjective relays 7/10 overall Rt ankle pain today    Pertinent History 11/22/2019 right ankle trimalleolar fx w/ORIF, left subtalar & talonavicular fusion 07/21/17, DDD, HTN    How long can you sit comfortably? 20 minutes    How long can you stand comfortably? 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 gentle grade 1-2 mobs for A-P and distraction      Ankle Exercises: Aerobic   Recumbent Bike L2 X 7 min      Ankle Exercises: Stretches   Soleus Stretch 5 reps;10 seconds   standing   Gastroc Stretch 30 seconds;3 reps   standing     Ankle Exercises: Seated   Heel Raises 20 reps   manual resitance pushing down on knees   Toe Raise 20 reps  BAPS Sitting;Level 2   20 reps A/P, lateral, circles     Ankle Exercises: Supine   T-Band red X 20 4 way    Other Supine Ankle Exercises alphabetX 2 reps                    PT Short Term Goals - 12/26/19 1208      PT SHORT TERM GOAL #1   Title Patient demonstrates understanding of initial HEP.    Time 1    Period Months    Status New    Target Date 01/26/20      PT SHORT TERM GOAL #2   Title Patient reports right ankle pain </= 5/10 with non-weightbearing exercises.    Time 1    Period Months    Status New    Target Date 01/26/20             PT Long Term Goals - 12/26/19 1203      PT LONG TERM GOAL #1   Title Patient demonstrates & verbalizes understanding of ongoing HEP.    Time 2    Period Months    Status New    Target Date 02/23/20      PT LONG TERM GOAL #2   Title FOTO score >/= 60% functional or <40%  limited    Time 2    Period Months    Status New    Target Date 02/23/20      PT LONG TERM GOAL #3   Title right ankle range within 10* of left ankle range.    Time 2    Period Months    Status New    Target Date 02/23/20      PT LONG TERM GOAL #4   Title Patient ambulates >500' and negotiates ramps, curbs & stairs without device independently.    Time 2    Period Months    Status New    Target Date 02/23/20      PT LONG TERM GOAL #5   Title Right single leg stance >10seconds    Time 2    Period Months    Status New    Target Date 02/23/20      Additional Long Term Goals   Additional Long Term Goals Yes      PT LONG TERM GOAL #6   Title right ankle & foot pain </= 2/10 with standing & gait activities    Time 2    Period Months    Status New    Target Date 02/23/20                 Plan - 01/17/20 1154    Clinical Impression Statement Session limited some in weight bearing ability due to pain. Instead worked more in NWB for ROM and mobility and overall strength in open chain. Continued with vaso to reduce pain and edema. Continue POC    Personal Factors and Comorbidities Comorbidity 3+    Comorbidities left subtalar & talonavicular fusion 07/21/17, DDD, HTN    Examination-Activity Limitations Lift;Locomotion Level;Squat;Stairs;Sit;Stand    Examination-Participation Musician;Occupation    Stability/Clinical Decision Making Stable/Uncomplicated    Rehab Potential Good    PT Frequency 2x / week    PT Duration Other (comment)   9 weeks   PT Treatment/Interventions ADLs/Self Care Home Management;Electrical Stimulation;Cryotherapy;DME Instruction;Gait training;Stair training;Contrast Bath;Functional mobility training;Therapeutic activities;Therapeutic exercise;Balance training;Neuromuscular re-education;Patient/family education;Manual techniques;Scar mobilization;Passive range of motion;Dry needling;Vasopneumatic Device;Vestibular;Joint  Manipulations    PT  Next Visit Plan ankle range, standing & gait with crutches PWB/WBAT without CAM walking boot,   vasopneumatic    PT Home Exercise Plan Access Code: [email or web address]    Consulted and Agree with Plan of Care Patient           Patient will benefit from skilled therapeutic intervention in order to improve the following deficits and impairments:  Abnormal gait, Decreased balance, Decreased endurance, Decreased mobility, Decreased range of motion, Decreased scar mobility, Decreased strength, Increased edema, Impaired flexibility, Pain  Visit Diagnosis: Stiffness of right ankle, not elsewhere classified  Localized edema  Pain in right ankle and joints of right foot  Other abnormalities of gait and mobility  Unsteadiness on feet  Muscle weakness (generalized)     Problem List Patient Active Problem List   Diagnosis Date Noted   Trimalleolar fracture of ankle, closed, left, initial encounter    Trimalleolar fracture of ankle, closed, right, initial encounter    Routine general medical examination at a health care facility 02/13/2019   Gastroesophageal reflux disease without esophagitis 02/13/2019   Screening for cervical cancer 02/13/2019   Visit for screening mammogram 02/13/2019   Hyperglycemia 10/05/2017   Vitamin D deficiency disease 10/05/2017   Obesity 06/08/2016   DDD (degenerative disc disease), lumbar 05/21/2014   Essential hypertension 05/21/2014    Silvestre Mesi 01/17/2020, 11:55 AM  [facility] Physical Therapy [address] [place], [place], [address] Phone: [phone]   Fax:  [phone]  Name: Truda Staub MRN: [id number] Date of Birth: [birth year]

## 2020-01-22 ENCOUNTER — Encounter: Payer: Self-pay | Admitting: Physical Therapy

## 2020-01-22 ENCOUNTER — Other Ambulatory Visit: Payer: Self-pay

## 2020-01-22 ENCOUNTER — Ambulatory Visit (INDEPENDENT_AMBULATORY_CARE_PROVIDER_SITE_OTHER): Payer: BC Managed Care – PPO | Admitting: Physical Therapy

## 2020-01-22 DIAGNOSIS — R600 Localized edema: Secondary | ICD-10-CM

## 2020-01-22 DIAGNOSIS — M25671 Stiffness of right ankle, not elsewhere classified: Secondary | ICD-10-CM | POA: Diagnosis not present

## 2020-01-22 DIAGNOSIS — M6281 Muscle weakness (generalized): Secondary | ICD-10-CM

## 2020-01-22 DIAGNOSIS — R2689 Other abnormalities of gait and mobility: Secondary | ICD-10-CM

## 2020-01-22 DIAGNOSIS — R2681 Unsteadiness on feet: Secondary | ICD-10-CM

## 2020-01-22 DIAGNOSIS — M25571 Pain in right ankle and joints of right foot: Secondary | ICD-10-CM | POA: Diagnosis not present

## 2020-01-22 NOTE — Therapy (Signed)
[facility] Physical Therapy [address] [place], [place], [address] Phone: [phone]   Fax:  [phone]  Physical Therapy Treatment  Patient Details  Name: Jamie Jenkins MRN: [id number] Date of Birth: 1960-09-05 Referring Provider (PT): Bevely Palmer Persons, [place]   Encounter Date: 01/22/2020   PT End of Session - 01/22/20 1221    Visit Number 9    Number of Visits 18    Authorization Type [employer] Comm PPO    PT Start Time 1700    PT Stop Time 1229    PT Time Calculation (min) 42 min    Equipment Utilized During Treatment Other (comment)   CAM boot   Activity Tolerance Patient limited by pain    Behavior During Therapy [facility] for tasks assessed/performed           Past Medical History:  Diagnosis Date  . Ankle fracture    right  . DDD (degenerative disc disease), lumbar   . Gall stones   . Hypertension     Past Surgical History:  Procedure Laterality Date  . ANKLE FUSION Left 07/21/2017   Procedure: SUBTALAR AND TALONAVICULAR FUSION LEFT FOOT;  Surgeon: Newt Minion, MD;  Location: [place];  Service: Orthopedics;  Laterality: Left;  . AXILLARY LYMPH NODE DISSECTION Left 11/24/2012   Procedure: incision and drainage axillary abscess;  Surgeon: Adin Hector, MD;  Location: [place];  Service: General;  Laterality: Left;  . BREAST SURGERY  2012   breast biopsy  . CHOLECYSTECTOMY    . ORIF ANKLE FRACTURE Right 11/22/2019   Procedure: OPEN REDUCTION INTERNAL FIXATION (ORIF) RIGHT ANKLE FRACTURE;  Surgeon: Newt Minion, MD;  Location: [place];  Service: Orthopedics;  Laterality: Right;  . TUBAL LIGATION      There were no vitals filed for this visit.   Subjective Assessment - 01/22/20 1217    Subjective relays 7/10 overall Rt ankle pain today, she can take a few steps without UE support or AD now    Pertinent History 11/22/2019 right ankle trimalleolar fx w/ORIF, left subtalar & talonavicular fusion 07/21/17, DDD, HTN    How long can you sit comfortably? 20  minutes    How long can you stand comfortably? 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 gentle grade 1-2 mobs for A-P and distraction      Ankle Exercises: Stretches   Soleus Stretch --   standing   Gastroc Stretch 30 seconds;3 reps   seated with strap     Ankle Exercises: Seated   Heel Raises 20 reps  manual resitance pushing down on knees   Toe Raise 20 reps    BAPS Sitting;Level 2;15 reps   reps A/P, lateral, circles     Ankle Exercises: Supine   T-Band red X 20 4 way      Ankle Exercises: Standing   Other Standing Ankle Exercises in bars up/down 2 trips ea for sidestepping, retro/fwd walking, tandem walk fwd/retro                    PT Short Term Goals - 01/22/20 1233      PT SHORT TERM GOAL #1   Title Patient demonstrates understanding of initial HEP.    Time 1    Period Months    Status Achieved    Target Date 01/26/20      PT SHORT TERM GOAL #2   Title Patient reports right ankle pain </= 5/10 with non-weightbearing exercises.    Baseline 7/10 on avg now    Time 1    Period Months    Status On-going    Target Date 01/26/20             PT Long Term Goals - 12/26/19 1203      PT LONG TERM GOAL #1   Title Patient demonstrates & verbalizes  understanding of ongoing HEP.    Time 2    Period Months    Status New    Target Date 02/23/20      PT LONG TERM GOAL #2   Title FOTO score >/= 60% functional or <40% limited    Time 2    Period Months    Status New    Target Date 02/23/20      PT LONG TERM GOAL #3   Title right ankle range within 10* of left ankle range.    Time 2    Period Months    Status New    Target Date 02/23/20      PT LONG TERM GOAL #4   Title Patient ambulates >500' and negotiates ramps, curbs & stairs without device independently.    Time 2    Period Months    Status New    Target Date 02/23/20      PT LONG TERM GOAL #5   Title Right single leg stance >10seconds    Time 2    Period Months    Status New    Target Date 02/23/20      Additional Long Term Goals   Additional Long Term Goals Yes      PT LONG TERM GOAL #6   Title right ankle & foot pain </= 2/10 with standing & gait activities    Time 2    Period Months    Status New    Target Date 02/23/20                 Plan - 01/22/20 1223    Clinical Impression Statement Continued to progress ROM, strength, and weight bearing tolerance as able. Trialed ambulation with SPC and single single crutch today to progress from bilat crutches she continues to use. PT will attempt to wean from bilat crutches as able.    Personal Factors and Comorbidities Comorbidity 3+    Comorbidities left subtalar & talonavicular fusion 07/21/17, DDD, HTN    Examination-Activity Limitations Lift;Locomotion Level;Squat;Stairs;Sit;Stand    Examination-Participation Musician;Occupation    Stability/Clinical Decision Making Stable/Uncomplicated    Rehab Potential Good    PT Frequency 2x / week  PT Duration Other (comment)   9 weeks   PT Treatment/Interventions ADLs/Self Care Home Management;Electrical Stimulation;Cryotherapy;DME Instruction;Gait training;Stair training;Contrast Bath;Functional mobility training;Therapeutic  activities;Therapeutic exercise;Balance training;Neuromuscular re-education;Patient/family education;Manual techniques;Scar mobilization;Passive range of motion;Dry needling;Vasopneumatic Device;Vestibular;Joint Manipulations    PT Next Visit Plan ankle range, standing & gait with crutches PWB/WBAT without CAM walking boot,   vasopneumatic    PT Home Exercise Plan Access Code: [email or web address]    Consulted and Agree with Plan of Care Patient           Patient will benefit from skilled therapeutic intervention in order to improve the following deficits and impairments:  Abnormal gait, Decreased balance, Decreased endurance, Decreased mobility, Decreased range of motion, Decreased scar mobility, Decreased strength, Increased edema, Impaired flexibility, Pain  Visit Diagnosis: Stiffness of right ankle, not elsewhere classified  Localized edema  Pain in right ankle and joints of right foot  Other abnormalities of gait and mobility  Unsteadiness on feet  Muscle weakness (generalized)     Problem List Patient Active Problem List   Diagnosis Date Noted  . Trimalleolar fracture of ankle, closed, left, initial encounter   . Trimalleolar fracture of ankle, closed, right, initial encounter   . Routine general medical examination at a health care facility 02/13/2019  . Gastroesophageal reflux disease without esophagitis 02/13/2019  . Screening for cervical cancer 02/13/2019  . Visit for screening mammogram 02/13/2019  . Hyperglycemia 10/05/2017  . Vitamin D deficiency disease 10/05/2017  . Obesity 06/08/2016  . DDD (degenerative disc disease), lumbar 05/21/2014  . Essential hypertension 05/21/2014    Silvestre Mesi 01/22/2020, 12:34 PM  [facility] Physical Therapy [address] [place], [place], [address] Phone: [phone]   Fax:  [phone]  Name: Jamie Jenkins MRN: [id number] Date of Birth: 12/11/1960

## 2020-01-24 ENCOUNTER — Ambulatory Visit (INDEPENDENT_AMBULATORY_CARE_PROVIDER_SITE_OTHER): Payer: BC Managed Care – PPO | Admitting: Physical Therapy

## 2020-01-24 ENCOUNTER — Other Ambulatory Visit: Payer: Self-pay

## 2020-01-24 DIAGNOSIS — M25571 Pain in right ankle and joints of right foot: Secondary | ICD-10-CM | POA: Diagnosis not present

## 2020-01-24 DIAGNOSIS — M6281 Muscle weakness (generalized): Secondary | ICD-10-CM

## 2020-01-24 DIAGNOSIS — R2689 Other abnormalities of gait and mobility: Secondary | ICD-10-CM

## 2020-01-24 DIAGNOSIS — R600 Localized edema: Secondary | ICD-10-CM | POA: Diagnosis not present

## 2020-01-24 DIAGNOSIS — M25671 Stiffness of right ankle, not elsewhere classified: Secondary | ICD-10-CM | POA: Diagnosis not present

## 2020-01-24 NOTE — Therapy (Signed)
[facility] [address] [place], [place], [address] Phone: [phone]   Fax:  [phone]  Physical Therapy Treatment  Patient Details  Name: Jamie Jenkins MRN: [id number] Date of Birth: 1961-02-10 Referring Provider (PT): Bevely Palmer Persons, [place]   Encounter Date: 01/24/2020   PT End of Session - 01/24/20 1231    Visit Number 10    Number of Visits 18    Authorization Type BCBS Comm PPO    PT Start Time 8921    PT Stop Time 1232    PT Time Calculation (min) 45 min    Equipment Utilized During Treatment Other (comment)   CAM boot   Activity Tolerance Patient limited by pain    Behavior During Therapy [facility] for tasks assessed/performed           Past Medical History:  Diagnosis Date  . Ankle fracture    right  . DDD (degenerative disc disease), lumbar   . Gall stones   . Hypertension     Past Surgical History:  Procedure Laterality Date  . ANKLE FUSION Left 07/21/2017   Procedure: SUBTALAR AND TALONAVICULAR FUSION LEFT FOOT;  Surgeon: Newt Minion, MD;  Location: [place];  Service: Orthopedics;  Laterality: Left;  . AXILLARY LYMPH NODE DISSECTION Left 11/24/2012   Procedure: incision and drainage axillary abscess;  Surgeon: Adin Hector, MD;  Location: [place];  Service: General;  Laterality: Left;  . BREAST SURGERY  2012   breast biopsy  . CHOLECYSTECTOMY    . ORIF ANKLE FRACTURE Right 11/22/2019   Procedure: OPEN REDUCTION INTERNAL FIXATION (ORIF) RIGHT ANKLE FRACTURE;  Surgeon: Newt Minion, MD;  Location: [place];  Service: Orthopedics;  Laterality: Right;  . TUBAL LIGATION      There were no vitals filed for this visit.   Subjective Assessment - 01/24/20 1227    Subjective relays the pain is a little better 5/10 overall    Pertinent History 11/22/2019 right ankle trimalleolar fx w/ORIF, left subtalar & talonavicular fusion 07/21/17, DDD, HTN    How long can you sit comfortably? 20 minutes    How long can you stand  comfortably? 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    Pain Onset More than a month ago              [facility] Adult PT Treatment/Exercise - 01/24/20 0001      Ambulation/Gait   Ambulation/Gait Yes    Ambulation/Gait Assistance 5: Supervision    Ambulation Distance (Feet) 150 Feet   10 ft  X2 without AD, 50 ft with SPC, 50 ft single crutch   Assistive device Crutches      Vasopneumatic   Number Minutes Vasopneumatic  10 minutes    Vasopnuematic Location  Ankle    Vasopneumatic Pressure Medium    Vasopneumatic Temperature  34      Manual Therapy   Manual therapy comments Rt ankle PROM all planes to toleranc. gentle grade 1-2 mobs for A-P and distraction      Ankle Exercises: Stretches   Soleus Stretch 10 seconds   10 reps for Rt on slantboard   Gastroc Stretch 30 seconds;3 reps   seated with strap     Ankle Exercises: Standing   Heel Raises 10 reps    Toe Raise 10 reps    Other Standing Ankle Exercises in bars up/down 3 trips  ea for sidestepping, retro/marchwalking, tandem walk       Ankle Exercises: Seated   BAPS Sitting;Level 2   20 reps A/P, lateral, circles     Ankle Exercises: Supine   T-Band red X 20 4 way      Ankle Exercises: Aerobic   Recumbent Bike 6 min   L4 on UBE leg portion                   PT Short Term Goals - 01/22/20 1233      PT SHORT TERM GOAL #1   Title Patient demonstrates understanding of initial HEP.    Time 1    Period Months    Status Achieved    Target Date 01/26/20      PT SHORT TERM GOAL #2   Title Patient reports right ankle pain </= 5/10 with non-weightbearing exercises.    Baseline 7/10 on avg now    Time 1    Period Months    Status On-going    Target Date 01/26/20             PT Long Term Goals - 12/26/19 1203      PT LONG TERM GOAL #1   Title Patient demonstrates & verbalizes understanding of  ongoing HEP.    Time 2    Period Months    Status New    Target Date 02/23/20      PT LONG TERM GOAL #2   Title FOTO score >/= 60% functional or <40% limited    Time 2    Period Months    Status New    Target Date 02/23/20      PT LONG TERM GOAL #3   Title right ankle range within 10* of left ankle range.    Time 2    Period Months    Status New    Target Date 02/23/20      PT LONG TERM GOAL #4   Title Patient ambulates >500' and negotiates ramps, curbs & stairs without device independently.    Time 2    Period Months    Status New    Target Date 02/23/20      PT LONG TERM GOAL #5   Title Right single leg stance >10seconds    Time 2    Period Months    Status New    Target Date 02/23/20      Additional Long Term Goals   Additional Long Term Goals Yes      PT LONG TERM GOAL #6   Title right ankle & foot pain </= 2/10 with standing & gait activities    Time 2    Period Months    Status New    Target Date 02/23/20                 Plan - 01/24/20 1231    Clinical Impression Statement She was able to progress heel and toe raises to standing today. Ambulation and strength slowly improving with PT but continues to have deficits and witll continue to benefit from PT. She is now only using one crutch however.    Personal Factors and Comorbidities Comorbidity 3+    Comorbidities left subtalar & talonavicular fusion 07/21/17, DDD, HTN    Examination-Activity Limitations Lift;Locomotion Level;Squat;Stairs;Sit;Stand    Examination-Participation Musician;Occupation    Stability/Clinical Decision Making Stable/Uncomplicated    Rehab Potential Good    PT Frequency 2x / week    PT Duration  Other (comment)   9 weeks   PT Treatment/Interventions ADLs/Self Care Home Management;Electrical Stimulation;Cryotherapy;DME Instruction;Gait training;Stair training;Contrast Bath;Functional mobility training;Therapeutic activities;Therapeutic  exercise;Balance training;Neuromuscular re-education;Patient/family education;Manual techniques;Scar mobilization;Passive range of motion;Dry needling;Vasopneumatic Device;Vestibular;Joint Manipulations    PT Next Visit Plan ankle range, standing & gait with crutches PWB/WBAT without CAM walking boot,   vasopneumatic    PT Home Exercise Plan Access Code: [email or web address]    Consulted and Agree with Plan of Care Patient           Patient will benefit from skilled therapeutic intervention in order to improve the following deficits and impairments:  Abnormal gait, Decreased balance, Decreased endurance, Decreased mobility, Decreased range of motion, Decreased scar mobility, Decreased strength, Increased edema, Impaired flexibility, Pain  Visit Diagnosis: Stiffness of right ankle, not elsewhere classified  Localized edema  Pain in right ankle and joints of right foot  Other abnormalities of gait and mobility  Muscle weakness (generalized)     Problem List Patient Active Problem List   Diagnosis Date Noted  . Trimalleolar fracture of ankle, closed, left, initial encounter   . Trimalleolar fracture of ankle, closed, right, initial encounter   . Routine general medical examination at a health care facility 02/13/2019  . Gastroesophageal reflux disease without esophagitis 02/13/2019  . Screening for cervical cancer 02/13/2019  . Visit for screening mammogram 02/13/2019  . Hyperglycemia 10/05/2017  . Vitamin D deficiency disease 10/05/2017  . Obesity 06/08/2016  . DDD (degenerative disc disease), lumbar 05/21/2014  . Essential hypertension 05/21/2014    Debbe Odea, PT,DPT 01/24/2020, 12:33 PM  [facility] [address] [place], [place], [address] Phone: [phone]   Fax:  [phone]  Name: Jamie Jenkins MRN: [id number] Date of Birth: 01-15-1961

## 2020-01-30 ENCOUNTER — Other Ambulatory Visit: Payer: Self-pay | Admitting: Physician Assistant

## 2020-01-30 ENCOUNTER — Other Ambulatory Visit: Payer: Self-pay

## 2020-01-30 ENCOUNTER — Telehealth: Payer: Self-pay | Admitting: Physician Assistant

## 2020-01-30 ENCOUNTER — Ambulatory Visit (INDEPENDENT_AMBULATORY_CARE_PROVIDER_SITE_OTHER): Payer: BC Managed Care – PPO | Admitting: Physical Therapy

## 2020-01-30 DIAGNOSIS — R600 Localized edema: Secondary | ICD-10-CM

## 2020-01-30 DIAGNOSIS — M25671 Stiffness of right ankle, not elsewhere classified: Secondary | ICD-10-CM

## 2020-01-30 DIAGNOSIS — R2689 Other abnormalities of gait and mobility: Secondary | ICD-10-CM | POA: Diagnosis not present

## 2020-01-30 DIAGNOSIS — M25571 Pain in right ankle and joints of right foot: Secondary | ICD-10-CM

## 2020-01-30 DIAGNOSIS — M6281 Muscle weakness (generalized): Secondary | ICD-10-CM

## 2020-01-30 MED ORDER — OXYCODONE-ACETAMINOPHEN 5-325 MG PO TABS
1.0000 | ORAL_TABLET | Freq: Three times a day (TID) | ORAL | 0 refills | Status: DC | PRN
Start: 2020-01-30 — End: 2020-02-22

## 2020-01-30 NOTE — Telephone Encounter (Signed)
done

## 2020-01-30 NOTE — Telephone Encounter (Signed)
Pt would like a refill of oxycodone sent to her [facility] on [address] please

## 2020-01-30 NOTE — Telephone Encounter (Signed)
Pt s/p ORIF on 11/22/19 right ankle. Requesting refill on oxycodone 5/325 last refill was 01/08/20 #30

## 2020-01-30 NOTE — Telephone Encounter (Signed)
I called pt to advise that this has been done.

## 2020-01-30 NOTE — Therapy (Signed)
[facility] Physical Therapy [address] [place], [place], [address] Phone: [phone]   Fax:  [phone]  Physical Therapy Treatment  Patient Details  Name: Jamie Jenkins MRN: [id number] Date of Birth: [birth year] Referring Provider (PT): Bevely Palmer Persons, [place]   Encounter Date: 01/30/2020   PT End of Session - 01/30/20 1008    Visit Number 11    Number of Visits 18    Authorization Type BCBS Comm PPO    PT Start Time 0932    PT Stop Time 1016    PT Time Calculation (min) 44 min    Equipment Utilized During Treatment --   CAM boot   Activity Tolerance Patient limited by pain;Patient tolerated treatment well    Behavior During Therapy [facility] for tasks assessed/performed           Past Medical History:  Diagnosis Date  . Ankle fracture    right  . DDD (degenerative disc disease), lumbar   . Gall stones   . Hypertension     Past Surgical History:  Procedure Laterality Date  . ANKLE FUSION Left 07/21/2017   Procedure: SUBTALAR AND TALONAVICULAR FUSION LEFT FOOT;  Surgeon: Newt Minion, MD;  Location: [place];  Service: Orthopedics;  Laterality: Left;  . AXILLARY LYMPH NODE DISSECTION Left 11/24/2012   Procedure: incision and drainage axillary abscess;  Surgeon: Adin Hector, MD;  Location: [place];  Service: General;  Laterality: Left;  . BREAST SURGERY  2012   breast biopsy  . CHOLECYSTECTOMY    . ORIF ANKLE FRACTURE Right 11/22/2019   Procedure: OPEN REDUCTION INTERNAL FIXATION (ORIF) RIGHT ANKLE FRACTURE;  Surgeon: Newt Minion, MD;  Location: [place];  Service: Orthopedics;  Laterality: Right;  . TUBAL LIGATION      There were no vitals filed for this visit.   Subjective Assessment - 01/30/20 1007    Subjective relays 4/10 overall pain in her Rt ankle    Pertinent History 11/22/2019 right ankle trimalleolar fx w/ORIF, left subtalar & talonavicular fusion 07/21/17, DDD, HTN    How long can you sit comfortably? 20 minutes    How long can you  stand comfortably? 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    Pain Onset More than a month ago             [facility] Adult PT Treatment/Exercise - 01/30/20 0001      Ambulation/Gait   Ambulation/Gait Yes    Ambulation/Gait Assistance 6: Modified independent (Device/Increase time)    Ambulation Distance (Feet) 150 Feet    Assistive device --   one crutch   Gait Pattern Step-to pattern;Decreased step length - left;Decreased stance time - right;Antalgic      Vasopneumatic   Number Minutes Vasopneumatic  10 minutes    Vasopnuematic Location  Ankle    Vasopneumatic Pressure Medium    Vasopneumatic Temperature  34      Manual Therapy   Manual therapy comments Rt ankle PROM all planes to toleranc. gentle grade 1-2 mobs for A-P and distraction      Ankle Exercises: Aerobic   Nustep seat 10 level 4  8 minutes      Ankle Exercises: Seated   BAPS Sitting;15 reps      Ankle Exercises: Supine   T-Band --      Ankle Exercises: Stretches   Soleus Stretch 10 seconds   10  times   Gastroc Stretch 30 seconds;3 reps   slantboard     Ankle Exercises: Standing   Heel Raises 10 reps   with UE support 2 sets   Toe Raise 10 reps   with UE support, 2 sets   Other Standing Ankle Exercises in bars up/down 3 trips ea for sidestepping, retro/marchwalking, tandem walk.                     PT Short Term Goals - 01/30/20 1011      PT SHORT TERM GOAL #1   Title Patient demonstrates understanding of initial HEP.    Time 1    Period Months    Status Achieved    Target Date 01/26/20      PT SHORT TERM GOAL #2   Title Patient reports right ankle pain </= 5/10 with non-weightbearing exercises.    Baseline 7/10 on avg now    Time 1    Period Months    Status Achieved    Target Date 01/26/20             PT Long Term Goals - 01/30/20 1011      PT LONG TERM GOAL #1    Title Patient demonstrates & verbalizes understanding of ongoing HEP.    Time 2    Period Months    Status On-going      PT LONG TERM GOAL #2   Title FOTO score >/= 60% functional or <40% limited    Time 2    Period Months    Status On-going      PT LONG TERM GOAL #3   Title right ankle range within 10* of left ankle range.    Time 2    Period Months    Status On-going      PT LONG TERM GOAL #4   Title Patient ambulates >500' and negotiates ramps, curbs & stairs without device independently.    Time 2    Period Months    Status On-going      PT LONG TERM GOAL #5   Title Right single leg stance >10seconds    Time 2    Period Months    Status On-going      PT LONG TERM GOAL #6   Title right ankle & foot pain </= 2/10 with standing & gait activities    Baseline about 4-5 now    Time 2    Period Months    Status On-going                 Plan - 01/30/20 1009    Clinical Impression Statement She has progressed from bilat crutches to now only one crutch for now mod I ambulation. She has now met her short term goals. Contineud to work on gait, weight bearing tolerance, ankle ROM, and strength. PT will continue to progress as tolerated toward her functional goals.    Personal Factors and Comorbidities Comorbidity 3+    Comorbidities left subtalar & talonavicular fusion 07/21/17, DDD, HTN    Examination-Activity Limitations Lift;Locomotion Level;Squat;Stairs;Sit;Stand    Examination-Participation Musician;Occupation    Stability/Clinical Decision Making Stable/Uncomplicated    Rehab Potential Good    PT Frequency 2x / week    PT Duration Other (comment)   9 weeks   PT Treatment/Interventions ADLs/Self Care Home Management;Electrical Stimulation;Cryotherapy;DME Instruction;Gait training;Stair training;Contrast Bath;Functional mobility training;Therapeutic activities;Therapeutic exercise;Balance training;Neuromuscular re-education;Patient/family  education;Manual techniques;Scar mobilization;Passive range of motion;Dry needling;Vasopneumatic Device;Vestibular;Joint Manipulations  PT Next Visit Plan ankle range, strength, weight bearing tolerance, gait  vasopneumatic    PT Home Exercise Plan Access Code: [email or web address]    Consulted and Agree with Plan of Care Patient           Patient will benefit from skilled therapeutic intervention in order to improve the following deficits and impairments:  Abnormal gait, Decreased balance, Decreased endurance, Decreased mobility, Decreased range of motion, Decreased scar mobility, Decreased strength, Increased edema, Impaired flexibility, Pain  Visit Diagnosis: Stiffness of right ankle, not elsewhere classified  Localized edema  Pain in right ankle and joints of right foot  Other abnormalities of gait and mobility  Muscle weakness (generalized)     Problem List Patient Active Problem List   Diagnosis Date Noted  . Trimalleolar fracture of ankle, closed, left, initial encounter   . Trimalleolar fracture of ankle, closed, right, initial encounter   . Routine general medical examination at a health care facility 02/13/2019  . Gastroesophageal reflux disease without esophagitis 02/13/2019  . Screening for cervical cancer 02/13/2019  . Visit for screening mammogram 02/13/2019  . Hyperglycemia 10/05/2017  . Vitamin D deficiency disease 10/05/2017  . Obesity 06/08/2016  . DDD (degenerative disc disease), lumbar 05/21/2014  . Essential hypertension 05/21/2014    Silvestre Mesi 01/30/2020, 10:12 AM  [facility] Physical Therapy [address] [place], [place], [address] Phone: [phone]   Fax:  [phone]  Name: Jamie Jenkins MRN: [id number] Date of Birth: Apr 09, 1960

## 2020-02-02 ENCOUNTER — Encounter: Payer: BC Managed Care – PPO | Admitting: Physical Therapy

## 2020-02-02 ENCOUNTER — Ambulatory Visit (INDEPENDENT_AMBULATORY_CARE_PROVIDER_SITE_OTHER): Payer: BC Managed Care – PPO | Admitting: Physical Therapy

## 2020-02-02 ENCOUNTER — Other Ambulatory Visit: Payer: Self-pay

## 2020-02-02 DIAGNOSIS — M25571 Pain in right ankle and joints of right foot: Secondary | ICD-10-CM

## 2020-02-02 DIAGNOSIS — R600 Localized edema: Secondary | ICD-10-CM

## 2020-02-02 DIAGNOSIS — M25671 Stiffness of right ankle, not elsewhere classified: Secondary | ICD-10-CM | POA: Diagnosis not present

## 2020-02-02 DIAGNOSIS — R2689 Other abnormalities of gait and mobility: Secondary | ICD-10-CM | POA: Diagnosis not present

## 2020-02-02 DIAGNOSIS — M6281 Muscle weakness (generalized): Secondary | ICD-10-CM

## 2020-02-02 NOTE — Therapy (Signed)
[facility] Physical Therapy [address] [place], [place], [address] Phone: [phone]   Fax:  [phone]  Physical Therapy Treatment  Patient Details  Name: Jamie Jenkins MRN: [id number] Date of Birth: Oct 15, 1960 Referring Provider (PT): Bevely Palmer Persons, [place]   Encounter Date: 02/02/2020   PT End of Session - 02/02/20 1424    Visit Number 12    Number of Visits 18    Authorization Type BCBS Comm PPO    PT Start Time 1345    PT Stop Time 1431    PT Time Calculation (min) 46 min    Equipment Utilized During Treatment --   CAM boot   Activity Tolerance Patient limited by pain;Patient tolerated treatment well    Behavior During Therapy [facility] for tasks assessed/performed           Past Medical History:  Diagnosis Date  . Ankle fracture    right  . DDD (degenerative disc disease), lumbar   . Gall stones   . Hypertension     Past Surgical History:  Procedure Laterality Date  . ANKLE FUSION Left 07/21/2017   Procedure: SUBTALAR AND TALONAVICULAR FUSION LEFT FOOT;  Surgeon: Newt Minion, MD;  Location: [place];  Service: Orthopedics;  Laterality: Left;  . AXILLARY LYMPH NODE DISSECTION Left 11/24/2012   Procedure: incision and drainage axillary abscess;  Surgeon: Adin Hector, MD;  Location: [place];  Service: General;  Laterality: Left;  . BREAST SURGERY  2012   breast biopsy  . CHOLECYSTECTOMY    . ORIF ANKLE FRACTURE Right 11/22/2019   Procedure: OPEN REDUCTION INTERNAL FIXATION (ORIF) RIGHT ANKLE FRACTURE;  Surgeon: Newt Minion, MD;  Location: [place];  Service: Orthopedics;  Laterality: Right;  . TUBAL LIGATION      There were no vitals filed for this visit.   Subjective Assessment - 02/02/20 1353    Subjective relays overall still about 4/10 pain in her Rt ankle    Pertinent History 11/22/2019 right ankle trimalleolar fx w/ORIF, left subtalar & talonavicular fusion 07/21/17, DDD, HTN    How long can you sit comfortably? 20 minutes    How long  can you stand comfortably? 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    Pain Onset More than a month ago              [facility] PT Assessment - 02/02/20 0001      Assessment   Medical Diagnosis Right Trimalleolar Fracture ORIF    Referring Provider (PT) Bevely Palmer Persons, PA    Onset Date/Surgical Date 11/22/19      ROM / Strength   AROM / PROM / Strength AROM;PROM;Strength      AROM   AROM Assessment Site Ankle    Right/Left Ankle Right    Right Ankle Dorsiflexion --   to neutral   Right Ankle Plantar Flexion 25      PROM   Right/Left Ankle Right    Right Ankle Dorsiflexion 5    Right Ankle Plantar Flexion 30    Right Ankle Inversion 10    Right Ankle Eversion 25      Strength   Overall Strength Comments tested in supine    Right Ankle Dorsiflexion 4/5    Right Ankle Plantar Flexion 4-/5    Right Ankle Inversion 4/5    Right Ankle Eversion 4/5  Ambulation/Gait   Ambulation Distance (Feet) 75 Feet   X2   Assistive device None   one crutch             [facility] Adult PT Treatment/Exercise - 02/02/20 0001      Ambulation/Gait   Ambulation/Gait Yes    Ambulation/Gait Assistance 6: Modified independent (Device/Increase time)    Gait Pattern Step-to pattern;Decreased step length - left;Decreased stance time - right;Antalgic      Vasopneumatic   Number Minutes Vasopneumatic  10 minutes    Vasopnuematic Location  Ankle    Vasopneumatic Pressure Medium    Vasopneumatic Temperature  34      Manual Therapy   Manual therapy comments Rt ankle PROM all planes to toleranc. gentle grade 1-2 mobs for A-P and distraction      Ankle Exercises: Aerobic   Nustep seat 9 level 5  8 minutes      Ankle Exercises: Standing   Heel Raises 15 reps   with UE support 2 sets   Toe Raise 15 reps   with UE support, 2 sets   Other Standing Ankle Exercises at counter top: 3  trips ea for sidestepping, retrowalking, marchwalking, tandem walk.       Ankle Exercises: Stretches   Soleus Stretch 10 seconds   10 times   Gastroc Stretch 30 seconds;3 reps   slantboard     Ankle Exercises: Seated   BAPS Sitting;15 reps;Level 3      Ankle Exercises: Supine   T-Band red X 20 4 way                    PT Short Term Goals - 01/30/20 1011      PT SHORT TERM GOAL #1   Title Patient demonstrates understanding of initial HEP.    Time 1    Period Months    Status Achieved    Target Date 01/26/20      PT SHORT TERM GOAL #2   Title Patient reports right ankle pain </= 5/10 with non-weightbearing exercises.    Baseline 7/10 on avg now    Time 1    Period Months    Status Achieved    Target Date 01/26/20             PT Long Term Goals - 01/30/20 1011      PT LONG TERM GOAL #1   Title Patient demonstrates & verbalizes understanding of ongoing HEP.    Time 2    Period Months    Status On-going      PT LONG TERM GOAL #2   Title FOTO score >/= 60% functional or <40% limited    Time 2    Period Months    Status On-going      PT LONG TERM GOAL #3   Title right ankle range within 10* of left ankle range.    Time 2    Period Months    Status On-going      PT LONG TERM GOAL #4   Title Patient ambulates >500' and negotiates ramps, curbs & stairs without device independently.    Time 2    Period Months    Status On-going      PT LONG TERM GOAL #5   Title Right single leg stance >10seconds    Time 2    Period Months    Status On-going      PT LONG TERM GOAL #6   Title right ankle & foot  pain </= 2/10 with standing & gait activities    Baseline about 4-5 now    Time 2    Period Months    Status On-going                 Plan - 02/02/20 1425    Clinical Impression Statement Able to now ambulate some with no AD for short distances. Updated measurments show progress with Rt ankle strength and ROM but still limited with this and she  will continue to benefit from PT.    Personal Factors and Comorbidities Comorbidity 3+    Comorbidities left subtalar & talonavicular fusion 07/21/17, DDD, HTN    Examination-Activity Limitations Lift;Locomotion Level;Squat;Stairs;Sit;Stand    Examination-Participation Musician;Occupation    Stability/Clinical Decision Making Stable/Uncomplicated    Rehab Potential Good    PT Frequency 2x / week    PT Duration Other (comment)   9 weeks   PT Treatment/Interventions ADLs/Self Care Home Management;Electrical Stimulation;Cryotherapy;DME Instruction;Gait training;Stair training;Contrast Bath;Functional mobility training;Therapeutic activities;Therapeutic exercise;Balance training;Neuromuscular re-education;Patient/family education;Manual techniques;Scar mobilization;Passive range of motion;Dry needling;Vasopneumatic Device;Vestibular;Joint Manipulations    PT Next Visit Plan ankle range, strength, weight bearing tolerance, gait  vasopneumatic    PT Home Exercise Plan Access Code: [email or web address]    Consulted and Agree with Plan of Care Patient           Patient will benefit from skilled therapeutic intervention in order to improve the following deficits and impairments:  Abnormal gait, Decreased balance, Decreased endurance, Decreased mobility, Decreased range of motion, Decreased scar mobility, Decreased strength, Increased edema, Impaired flexibility, Pain  Visit Diagnosis: Stiffness of right ankle, not elsewhere classified  Localized edema  Pain in right ankle and joints of right foot  Other abnormalities of gait and mobility  Muscle weakness (generalized)     Problem List Patient Active Problem List   Diagnosis Date Noted  . Trimalleolar fracture of ankle, closed, left, initial encounter   . Trimalleolar fracture of ankle, closed, right, initial encounter   . Routine general medical examination at a health care facility 02/13/2019  . Gastroesophageal reflux  disease without esophagitis 02/13/2019  . Screening for cervical cancer 02/13/2019  . Visit for screening mammogram 02/13/2019  . Hyperglycemia 10/05/2017  . Vitamin D deficiency disease 10/05/2017  . Obesity 06/08/2016  . DDD (degenerative disc disease), lumbar 05/21/2014  . Essential hypertension 05/21/2014    Silvestre Mesi 02/02/2020, 2:26 PM  [facility] Physical Therapy [address] [place], [place], [address] Phone: [phone]   Fax:  [phone]  Name: Jamie Jenkins MRN: [id number] Date of Birth: Feb 01, 1961

## 2020-02-05 ENCOUNTER — Ambulatory Visit (INDEPENDENT_AMBULATORY_CARE_PROVIDER_SITE_OTHER): Payer: BC Managed Care – PPO | Admitting: Physician Assistant

## 2020-02-05 ENCOUNTER — Encounter: Payer: Self-pay | Admitting: Physician Assistant

## 2020-02-05 ENCOUNTER — Ambulatory Visit (INDEPENDENT_AMBULATORY_CARE_PROVIDER_SITE_OTHER): Payer: No Typology Code available for payment source

## 2020-02-05 ENCOUNTER — Ambulatory Visit (INDEPENDENT_AMBULATORY_CARE_PROVIDER_SITE_OTHER): Payer: BC Managed Care – PPO | Admitting: Physical Therapy

## 2020-02-05 ENCOUNTER — Other Ambulatory Visit: Payer: Self-pay

## 2020-02-05 ENCOUNTER — Encounter: Payer: Self-pay | Admitting: Physical Therapy

## 2020-02-05 DIAGNOSIS — M25671 Stiffness of right ankle, not elsewhere classified: Secondary | ICD-10-CM | POA: Diagnosis not present

## 2020-02-05 DIAGNOSIS — R2689 Other abnormalities of gait and mobility: Secondary | ICD-10-CM

## 2020-02-05 DIAGNOSIS — M25571 Pain in right ankle and joints of right foot: Secondary | ICD-10-CM

## 2020-02-05 DIAGNOSIS — Z8781 Personal history of (healed) traumatic fracture: Secondary | ICD-10-CM

## 2020-02-05 DIAGNOSIS — Z9889 Other specified postprocedural states: Secondary | ICD-10-CM | POA: Diagnosis not present

## 2020-02-05 DIAGNOSIS — R2681 Unsteadiness on feet: Secondary | ICD-10-CM

## 2020-02-05 DIAGNOSIS — M6281 Muscle weakness (generalized): Secondary | ICD-10-CM

## 2020-02-05 DIAGNOSIS — R600 Localized edema: Secondary | ICD-10-CM

## 2020-02-05 NOTE — Therapy (Signed)
[facility] Therapy [address] [place], [place], [address] Phone: [phone]   Fax:  [phone]  Physical Therapy Treatment  Patient Details  Name: Jamie Jenkins MRN: [id number] Date of Birth: 08/06/1960 Referring Provider (PT): Bevely Palmer Persons, [place]   Encounter Date: 02/05/2020   PT End of Session - 02/05/20 1012    Visit Number 13    Number of Visits 18    Authorization Type [employer] Comm PPO    PT Start Time [phone]    PT Stop Time 1022    PT Time Calculation (min) 39 min    Equipment Utilized During Treatment --   CAM boot   Activity Tolerance Patient limited by pain;Patient tolerated treatment well    Behavior During Therapy [facility] for tasks assessed/performed           Past Medical History:  Diagnosis Date  . Ankle fracture    right  . DDD (degenerative disc disease), lumbar   . Gall stones   . Hypertension     Past Surgical History:  Procedure Laterality Date  . ANKLE FUSION Left 07/21/2017   Procedure: SUBTALAR AND TALONAVICULAR FUSION LEFT FOOT;  Surgeon: Newt Minion, MD;  Location: [place];  Service: Orthopedics;  Laterality: Left;  . AXILLARY LYMPH NODE DISSECTION Left 11/24/2012   Procedure: incision and drainage axillary abscess;  Surgeon: Adin Hector, MD;  Location: [place];  Service: General;  Laterality: Left;  . BREAST SURGERY  2012   breast biopsy  . CHOLECYSTECTOMY    . ORIF ANKLE FRACTURE Right 11/22/2019   Procedure: OPEN REDUCTION INTERNAL FIXATION (ORIF) RIGHT ANKLE FRACTURE;  Surgeon: Newt Minion, MD;  Location: [place];  Service: Orthopedics;  Laterality: Right;  . TUBAL LIGATION      There were no vitals filed for this visit.   Subjective Assessment - 02/05/20 0945    Subjective relays overall still about 5/10 pain in her Rt ankle    Pertinent History 11/22/2019 right ankle trimalleolar fx w/ORIF, left subtalar & talonavicular fusion 07/21/17, DDD, HTN    How long can you sit comfortably? 20 minutes    How long  can you stand comfortably? 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    Pain Onset More than a month ago            [facility] Adult PT Treatment/Exercise - 02/05/20 0001      Ambulation/Gait   Ambulation/Gait Yes    Ambulation/Gait Assistance 6: Modified independent (Device/Increase time)    Ambulation Distance (Feet) 75 Feet   X2   Assistive device None   one crutch   Gait Pattern Step-to pattern;Decreased step length - left;Decreased stance time - right;Antalgic      Vasopneumatic   Number Minutes Vasopneumatic  10 minutes    Vasopnuematic Location  Ankle    Vasopneumatic Pressure Medium    Vasopneumatic Temperature  34      Manual Therapy   Manual therapy comments Rt ankle PROM all planes to toleranc. gentle grade 1-2 mobs for A-P and distraction      Ankle Exercises: Standing   Heel Raises 15 reps   with UE support 2 sets   Toe Raise 15 reps   with UE support, 2 sets   Other Standing Ankle Exercises at counter top: 3 trips ea for sidestepping,  marchwalking, tandem walk fwd/retro.  Ankle Exercises: Stretches   Soleus Stretch 10 seconds   10 times   Gastroc Stretch 30 seconds;3 reps   slantboard     Ankle Exercises: Seated   BAPS Sitting;15 reps;Level 3      Ankle Exercises: Supine   T-Band --      Ankle Exercises: Aerobic   Recumbent Bike L2 X 6 min                    PT Short Term Goals - 01/30/20 1011      PT SHORT TERM GOAL #1   Title Patient demonstrates understanding of initial HEP.    Time 1    Period Months    Status Achieved    Target Date 01/26/20      PT SHORT TERM GOAL #2   Title Patient reports right ankle pain </= 5/10 with non-weightbearing exercises.    Baseline 7/10 on avg now    Time 1    Period Months    Status Achieved    Target Date 01/26/20             PT Long Term Goals - 01/30/20 1011      PT LONG  TERM GOAL #1   Title Patient demonstrates & verbalizes understanding of ongoing HEP.    Time 2    Period Months    Status On-going      PT LONG TERM GOAL #2   Title FOTO score >/= 60% functional or <40% limited    Time 2    Period Months    Status On-going      PT LONG TERM GOAL #3   Title right ankle range within 10* of left ankle range.    Time 2    Period Months    Status On-going      PT LONG TERM GOAL #4   Title Patient ambulates >500' and negotiates ramps, curbs & stairs without device independently.    Time 2    Period Months    Status On-going      PT LONG TERM GOAL #5   Title Right single leg stance >10seconds    Time 2    Period Months    Status On-going      PT LONG TERM GOAL #6   Title right ankle & foot pain </= 2/10 with standing & gait activities    Baseline about 4-5 now    Time 2    Period Months    Status On-going                 Plan - 02/05/20 1014    Clinical Impression Statement She relays MD was pleased with overall progress and relased her but wants her to continue with PT. Continued to work on Rt ankle ROM, strength, WB and gait as tolreated.    Personal Factors and Comorbidities Comorbidity 3+    Comorbidities left subtalar & talonavicular fusion 07/21/17, DDD, HTN    Examination-Activity Limitations Lift;Locomotion Level;Squat;Stairs;Sit;Stand    Examination-Participation Musician;Occupation    Stability/Clinical Decision Making Stable/Uncomplicated    Rehab Potential Good    PT Frequency 2x / week    PT Duration Other (comment)   9 weeks   PT Treatment/Interventions ADLs/Self Care Home Management;Electrical Stimulation;Cryotherapy;DME Instruction;Gait training;Stair training;Contrast Bath;Functional mobility training;Therapeutic activities;Therapeutic exercise;Balance training;Neuromuscular re-education;Patient/family education;Manual techniques;Scar mobilization;Passive range of motion;Dry  needling;Vasopneumatic Device;Vestibular;Joint Manipulations    PT Next Visit Plan ankle range, strength, weight bearing tolerance, gait  vasopneumatic  PT Home Exercise Plan Access Code: [email or web address]    Consulted and Agree with Plan of Care Patient           Patient will benefit from skilled therapeutic intervention in order to improve the following deficits and impairments:  Abnormal gait, Decreased balance, Decreased endurance, Decreased mobility, Decreased range of motion, Decreased scar mobility, Decreased strength, Increased edema, Impaired flexibility, Pain  Visit Diagnosis: Stiffness of right ankle, not elsewhere classified  Localized edema  Pain in right ankle and joints of right foot  Other abnormalities of gait and mobility  Muscle weakness (generalized)  Unsteadiness on feet     Problem List Patient Active Problem List   Diagnosis Date Noted  . Trimalleolar fracture of ankle, closed, left, initial encounter   . Trimalleolar fracture of ankle, closed, right, initial encounter   . Routine general medical examination at a health care facility 02/13/2019  . Gastroesophageal reflux disease without esophagitis 02/13/2019  . Screening for cervical cancer 02/13/2019  . Visit for screening mammogram 02/13/2019  . Hyperglycemia 10/05/2017  . Vitamin D deficiency disease 10/05/2017  . Obesity 06/08/2016  . DDD (degenerative disc disease), lumbar 05/21/2014  . Essential hypertension 05/21/2014    Silvestre Mesi 02/05/2020, 10:16 AM  [facility] Physical Therapy [address] [place], [place], [address] Phone: [phone]   Fax:  [phone]  Name: Jamie Jenkins MRN: [id number] Date of Birth: 1960-04-26

## 2020-02-05 NOTE — Progress Notes (Signed)
Office Visit Note   Patient: Jamie Jenkins           Date of Birth: September 28, 1960           MRN: [id number] Visit Date: 02/05/2020              Requested by: Janith Lima, MD [address] [place],  [place] [address] PCP: Janith Lima, MD  Chief Complaint  Patient presents with  . Right Ankle - Routine Post Op, Follow-up      HPI: The patient is almost 3 months status post open reduction internal fixation of right bimalleolar ankle fracture.  She is improving with physical therapy.  She still has some pain.  For the most part she is not wearing her boot. Assessment & Plan: Visit Diagnoses:  1. S/P ORIF (open reduction internal fixation) fracture     Plan: Patient may follow-up as needed.  She also mentioned that she has a small raised area over the suture line of surgery she had on her left foot a couple years ago.  There is no cellulitis no drainage is right in the area of the suture probably consistent with a retained piece of suture.  Told her this can be addressed today but she would like to defer at this time.  I told her that if she has any increasing symptoms she should contact us  Follow-Up Instructions: No follow-ups on file.   Ortho Exam  Patient is alert, oriented, no adenopathy, well-dressed, normal affect, normal respiratory effort. Right ankle well-healed surgical incision mild keloid scarring.  She still has some sensitivity to touch.  No cellulitis no fluctuance.  Range of motion of her ankle is mildly stiff but fairly painless.  No evidence of any infection  Imaging: No results found. No images are attached to the encounter.  Labs: Lab Results  Component Value Date   HGBA1C 5.5 02/13/2019   HGBA1C 5.6 10/05/2017   HGBA1C 5.5 06/23/2016     Lab Results  Component Value Date   ALBUMIN 3.7 10/05/2017   ALBUMIN 3.9 07/16/2017   ALBUMIN 4.3 06/23/2016    No results found for: MG Lab Results  Component Value Date   VD25OH 20.39 (L)  02/13/2019   VD25OH 7.43 (L) 10/05/2017   VD25OH 15.87 (L) 06/23/2016    No results found for: PREALBUMIN CBC EXTENDED Latest Ref Rng & Units 11/22/2019 02/13/2019 10/05/2017  WBC 4.0 - 10.5 K/uL 7.5 6.6 5.7  RBC 3.87 - 5.11 MIL/uL 4.38 4.49 3.94  HGB 12.0 - 15.0 g/dL 14.8 14.6 13.3  HCT 36 - 46 % 44.6 43.2 39.6  PLT 150 - 400 K/uL 251 249.0 238.0  NEUTROABS 1.4 - 7.7 K/uL - 3.6 2.9  LYMPHSABS 0.7 - 4.0 K/uL - 2.5 2.4     There is no height or weight on file to calculate BMI.  Orders:  Orders Placed This Encounter  Procedures  . XR Ankle Complete Right   No orders of the defined types were placed in this encounter.    Procedures: No procedures performed  Clinical Data: No additional findings.  ROS:  All other systems negative, except as noted in the HPI. Review of Systems  Objective: Vital Signs: LMP 08/23/2012   Specialty Comments:  No specialty comments available.  PMFS History: Patient Active Problem List   Diagnosis Date Noted  . Trimalleolar fracture of ankle, closed, left, initial encounter   . Trimalleolar fracture of ankle, closed, right, initial encounter   .  Routine general medical examination at a health care facility 02/13/2019  . Gastroesophageal reflux disease without esophagitis 02/13/2019  . Screening for cervical cancer 02/13/2019  . Visit for screening mammogram 02/13/2019  . Hyperglycemia 10/05/2017  . Vitamin D deficiency disease 10/05/2017  . Obesity 06/08/2016  . DDD (degenerative disc disease), lumbar 05/21/2014  . Essential hypertension 05/21/2014   Past Medical History:  Diagnosis Date  . Ankle fracture    right  . DDD (degenerative disc disease), lumbar   . Gall stones   . Hypertension     Family History  Problem Relation Age of Onset  . Pancreatic cancer Mother        Deceased  . Hypertension Father   . Deep vein thrombosis Brother        Deceased  . Healthy Sister   . Healthy Son   . Healthy Daughter     Past  Surgical History:  Procedure Laterality Date  . ANKLE FUSION Left 07/21/2017   Procedure: SUBTALAR AND TALONAVICULAR FUSION LEFT FOOT;  Surgeon: Newt Minion, MD;  Location: [place];  Service: Orthopedics;  Laterality: Left;  . AXILLARY LYMPH NODE DISSECTION Left 11/24/2012   Procedure: incision and drainage axillary abscess;  Surgeon: Adin Hector, MD;  Location: [place];  Service: General;  Laterality: Left;  . BREAST SURGERY  2012   breast biopsy  . CHOLECYSTECTOMY    . ORIF ANKLE FRACTURE Right 11/22/2019   Procedure: OPEN REDUCTION INTERNAL FIXATION (ORIF) RIGHT ANKLE FRACTURE;  Surgeon: Newt Minion, MD;  Location: [place];  Service: Orthopedics;  Laterality: Right;  . TUBAL LIGATION     Social History   Occupational History  . Occupation: Scientist, research (medical)  Tobacco Use  . Smoking status: Never Smoker  . Smokeless tobacco: Never Used  Vaping Use  . Vaping Use: Never used  Substance and Sexual Activity  . Alcohol use: Yes    Alcohol/week: 0.0 standard drinks    Comment: occasional  . Drug use: No  . Sexual activity: Not on file

## 2020-02-07 ENCOUNTER — Ambulatory Visit (INDEPENDENT_AMBULATORY_CARE_PROVIDER_SITE_OTHER): Payer: BC Managed Care – PPO | Admitting: Physical Therapy

## 2020-02-07 ENCOUNTER — Other Ambulatory Visit: Payer: Self-pay

## 2020-02-07 DIAGNOSIS — M25671 Stiffness of right ankle, not elsewhere classified: Secondary | ICD-10-CM

## 2020-02-07 DIAGNOSIS — R2689 Other abnormalities of gait and mobility: Secondary | ICD-10-CM | POA: Diagnosis not present

## 2020-02-07 DIAGNOSIS — R600 Localized edema: Secondary | ICD-10-CM

## 2020-02-07 DIAGNOSIS — M25571 Pain in right ankle and joints of right foot: Secondary | ICD-10-CM | POA: Diagnosis not present

## 2020-02-07 DIAGNOSIS — R2681 Unsteadiness on feet: Secondary | ICD-10-CM

## 2020-02-07 DIAGNOSIS — M6281 Muscle weakness (generalized): Secondary | ICD-10-CM

## 2020-02-07 NOTE — Therapy (Signed)
[facility] Physical Therapy [address] [place], [place], [address] Phone: [phone]   Fax:  [phone]  Physical Therapy Treatment  Patient Details  Name: Jamie Jenkins MRN: [id number] Date of Birth: 1961/03/05 Referring Provider (PT): Bevely Palmer Persons, [place]   Encounter Date: 02/07/2020   PT End of Session - 02/07/20 1012    Visit Number 14    Number of Visits 18    Authorization Type [employer]BS Comm PPO    PT Start Time 0930    PT Stop Time 1020    PT Time Calculation (min) 50 min    Equipment Utilized During Treatment --   CAM boot   Activity Tolerance Patient limited by pain;Patient tolerated treatment well    Behavior During Therapy [facility] for tasks assessed/performed           Past Medical History:  Diagnosis Date  . Ankle fracture    right  . DDD (degenerative disc disease), lumbar   . Gall stones   . Hypertension     Past Surgical History:  Procedure Laterality Date  . ANKLE FUSION Left 07/21/2017   Procedure: SUBTALAR AND TALONAVICULAR FUSION LEFT FOOT;  Surgeon: Newt Minion, MD;  Location: [place];  Service: Orthopedics;  Laterality: Left;  . AXILLARY LYMPH NODE DISSECTION Left 11/24/2012   Procedure: incision and drainage axillary abscess;  Surgeon: Adin Hector, MD;  Location: [place];  Service: General;  Laterality: Left;  . BREAST SURGERY  2012   breast biopsy  . CHOLECYSTECTOMY    . ORIF ANKLE FRACTURE Right 11/22/2019   Procedure: OPEN REDUCTION INTERNAL FIXATION (ORIF) RIGHT ANKLE FRACTURE;  Surgeon: Newt Minion, MD;  Location: [place];  Service: Orthopedics;  Laterality: Right;  . TUBAL LIGATION      There were no vitals filed for this visit.   Subjective Assessment - 02/07/20 0942    Subjective Pain in her Rt foot/ankle is about 4 out of 10 today, the pain is mostly around the lateral aspect of her ankle around the incision.    Pertinent History 11/22/2019 right ankle trimalleolar fx w/ORIF, left subtalar & talonavicular fusion  07/21/17, DDD, HTN    How long can you sit comfortably? 20 minutes    How long can you stand comfortably? 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    Pain Onset More than a month ago            [facility] Adult PT Treatment/Exercise - 02/07/20 0001      Ambulation/Gait   Ambulation/Gait Yes    Ambulation/Gait Assistance 6: Modified independent (Device/Increase time)    Ambulation Distance (Feet) 75 Feet   X2   Assistive device None   one crutch   Gait Pattern Step-to pattern;Decreased step length - left;Decreased stance time - right;Antalgic      Vasopneumatic   Number Minutes Vasopneumatic  10 minutes    Vasopnuematic Location  Ankle    Vasopneumatic Pressure High    Vasopneumatic Temperature  34      Manual Therapy   Manual therapy comments Rt ankle STM/scar massage/cupping to latreal Rt ankle at incision, 8 min      Ankle Exercises: Aerobic   Recumbent Bike L2 X 10 min      Ankle Exercises: Standing   Heel Raises 15 reps   with UE support 2 sets   Toe Raise 15 reps  with UE support, 2 sets   Other Standing Ankle Exercises at counter top: 3 trips ea for sidestepping,  marchwalking, tandem walk fwd/retro.       Ankle Exercises: Stretches   Soleus Stretch 10 seconds   10 times   Gastroc Stretch 30 seconds;3 reps   slantboard     Ankle Exercises: Seated   BAPS Sitting;15 reps;Level 3                    PT Short Term Goals - 01/30/20 1011      PT SHORT TERM GOAL #1   Title Patient demonstrates understanding of initial HEP.    Time 1    Period Months    Status Achieved    Target Date 01/26/20      PT SHORT TERM GOAL #2   Title Patient reports right ankle pain </= 5/10 with non-weightbearing exercises.    Baseline 7/10 on avg now    Time 1    Period Months    Status Achieved    Target Date 01/26/20             PT Long Term Goals - 01/30/20  1011      PT LONG TERM GOAL #1   Title Patient demonstrates & verbalizes understanding of ongoing HEP.    Time 2    Period Months    Status On-going      PT LONG TERM GOAL #2   Title FOTO score >/= 60% functional or <40% limited    Time 2    Period Months    Status On-going      PT LONG TERM GOAL #3   Title right ankle range within 10* of left ankle range.    Time 2    Period Months    Status On-going      PT LONG TERM GOAL #4   Title Patient ambulates >500' and negotiates ramps, curbs & stairs without device independently.    Time 2    Period Months    Status On-going      PT LONG TERM GOAL #5   Title Right single leg stance >10seconds    Time 2    Period Months    Status On-going      PT LONG TERM GOAL #6   Title right ankle & foot pain </= 2/10 with standing & gait activities    Baseline about 4-5 now    Time 2    Period Months    Status On-going                 Plan - 02/07/20 1013    Clinical Impression Statement Gait is overall improving some and she is walking further without as much need for AD. She does still require AD for community ambulation. Trialed STM/Scar massge/and cuppiing to lateral incision site as this is her biggest complaint of pain.    Personal Factors and Comorbidities Comorbidity 3+    Comorbidities left subtalar & talonavicular fusion 07/21/17, DDD, HTN    Examination-Activity Limitations Lift;Locomotion Level;Squat;Stairs;Sit;Stand    Examination-Participation Musician;Occupation    Stability/Clinical Decision Making Stable/Uncomplicated    Rehab Potential Good    PT Frequency 2x / week    PT Duration Other (comment)   9 weeks   PT Treatment/Interventions ADLs/Self Care Home Management;Electrical Stimulation;Cryotherapy;DME Instruction;Gait training;Stair training;Contrast Bath;Functional mobility training;Therapeutic activities;Therapeutic exercise;Balance training;Neuromuscular  re-education;Patient/family education;Manual techniques;Scar mobilization;Passive range of motion;Dry needling;Vasopneumatic Device;Vestibular;Joint Manipulations    PT Next  Visit Plan ankle range, strength, weight bearing tolerance, gait  vasopneumatic    PT Home Exercise Plan Access Code: [email or web address]    Consulted and Agree with Plan of Care Patient           Patient will benefit from skilled therapeutic intervention in order to improve the following deficits and impairments:  Abnormal gait, Decreased balance, Decreased endurance, Decreased mobility, Decreased range of motion, Decreased scar mobility, Decreased strength, Increased edema, Impaired flexibility, Pain  Visit Diagnosis: Stiffness of right ankle, not elsewhere classified  Localized edema  Pain in right ankle and joints of right foot  Other abnormalities of gait and mobility  Muscle weakness (generalized)  Unsteadiness on feet     Problem List Patient Active Problem List   Diagnosis Date Noted  . Trimalleolar fracture of ankle, closed, left, initial encounter   . Trimalleolar fracture of ankle, closed, right, initial encounter   . Routine general medical examination at a health care facility 02/13/2019  . Gastroesophageal reflux disease without esophagitis 02/13/2019  . Screening for cervical cancer 02/13/2019  . Visit for screening mammogram 02/13/2019  . Hyperglycemia 10/05/2017  . Vitamin D deficiency disease 10/05/2017  . Obesity 06/08/2016  . DDD (degenerative disc disease), lumbar 05/21/2014  . Essential hypertension 05/21/2014    Silvestre Mesi 02/07/2020, 10:14 AM  [facility] Physical Therapy [address] [place], [place], [address] Phone: [phone]   Fax:  [phone]  Name: Jamie Jenkins MRN: [id number] Date of Birth: [birth year]

## 2020-02-13 ENCOUNTER — Other Ambulatory Visit: Payer: Self-pay

## 2020-02-13 ENCOUNTER — Ambulatory Visit (INDEPENDENT_AMBULATORY_CARE_PROVIDER_SITE_OTHER): Payer: BC Managed Care – PPO | Admitting: Physical Therapy

## 2020-02-13 ENCOUNTER — Encounter: Payer: Self-pay | Admitting: Physical Therapy

## 2020-02-13 DIAGNOSIS — M25571 Pain in right ankle and joints of right foot: Secondary | ICD-10-CM | POA: Diagnosis not present

## 2020-02-13 DIAGNOSIS — R600 Localized edema: Secondary | ICD-10-CM | POA: Diagnosis not present

## 2020-02-13 DIAGNOSIS — M25671 Stiffness of right ankle, not elsewhere classified: Secondary | ICD-10-CM | POA: Diagnosis not present

## 2020-02-13 DIAGNOSIS — R2681 Unsteadiness on feet: Secondary | ICD-10-CM

## 2020-02-13 DIAGNOSIS — R2689 Other abnormalities of gait and mobility: Secondary | ICD-10-CM

## 2020-02-13 DIAGNOSIS — M6281 Muscle weakness (generalized): Secondary | ICD-10-CM

## 2020-02-13 NOTE — Therapy (Signed)
[facility] [address] [place], [place], [address] Phone: [phone]   Fax:  [phone]  Physical Therapy Treatment  Patient Details  Name: Jamie Jenkins MRN: [id number] Date of Birth: 05-04-1960 Referring Provider (PT): Bevely Palmer Persons, [place]   Encounter Date: 02/13/2020   PT End of Session - 02/13/20 1028    Visit Number 15    Number of Visits 18    Authorization Type [employer] Comm PPO    PT Start Time [phone]    PT Stop Time 1022    PT Time Calculation (min) 48 min    Equipment Utilized During Treatment --   CAM boot   Activity Tolerance Patient limited by pain;Patient tolerated treatment well    Behavior During Therapy [facility] for tasks assessed/performed           Past Medical History:  Diagnosis Date   Ankle fracture    right   DDD (degenerative disc disease), lumbar    Gall stones    Hypertension     Past Surgical History:  Procedure Laterality Date   ANKLE FUSION Left 07/21/2017   Procedure: SUBTALAR AND TALONAVICULAR FUSION LEFT FOOT;  Surgeon: Newt Minion, MD;  Location: [place];  Service: Orthopedics;  Laterality: Left;   AXILLARY LYMPH NODE DISSECTION Left 11/24/2012   Procedure: incision and drainage axillary abscess;  Surgeon: Adin Hector, MD;  Location: [place];  Service: General;  Laterality: Left;   BREAST SURGERY  2012   breast biopsy   CHOLECYSTECTOMY     ORIF ANKLE FRACTURE Right 11/22/2019   Procedure: OPEN REDUCTION INTERNAL FIXATION (ORIF) RIGHT ANKLE FRACTURE;  Surgeon: Newt Minion, MD;  Location: [place];  Service: Orthopedics;  Laterality: Right;   TUBAL LIGATION      There were no vitals filed for this visit.   Subjective Assessment - 02/13/20 0947    Subjective Pain in her Rt foot/ankle was really bad last night but she was been walking more and is no longer using AD    Pertinent History 11/22/2019 right ankle trimalleolar fx w/ORIF, left subtalar & talonavicular fusion 07/21/17, DDD, HTN    How  long can you sit comfortably? 20 minutes    How long can you stand comfortably? 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    Pain Onset More than a month ago                             [facility] Adult PT Treatment/Exercise - 02/13/20 0001      Ambulation/Gait   Ambulation/Gait Yes    Ambulation/Gait Assistance 6: Modified independent (Device/Increase time)    Ambulation Distance (Feet) 75 Feet   X3   Assistive device None   one crutch   Gait Pattern Step-to pattern;Decreased step length - left;Decreased stance time - right;Antalgic      Vasopneumatic   Number Minutes Vasopneumatic  10 minutes    Vasopnuematic Location  Ankle    Vasopneumatic Pressure High    Vasopneumatic Temperature  34      Manual Therapy   Manual therapy comments Rt ankle STM/scar massage/cupping to latreal Rt ankle at incision, 8 min      Ankle Exercises: Aerobic   Recumbent Bike L3 X 6 min      Ankle Exercises: Standing   Heel Raises 15 reps  with UE support 2 sets   Toe Raise 15 reps   with UE support   Other Standing Ankle Exercises at counter top: 3 trips ea for sidestepping,  marchwalking, tandem walk fwd/retro.       Ankle Exercises: Stretches   Soleus Stretch 10 seconds   10 times   Gastroc Stretch 30 seconds;3 reps   slantboard     Ankle Exercises: Seated   BAPS Sitting;15 reps;Level 3                    PT Short Term Goals - 01/30/20 1011      PT SHORT TERM GOAL #1   Title Patient demonstrates understanding of initial HEP.    Time 1    Period Months    Status Achieved    Target Date 01/26/20      PT SHORT TERM GOAL #2   Title Patient reports right ankle pain </= 5/10 with non-weightbearing exercises.    Baseline 7/10 on avg now    Time 1    Period Months    Status Achieved    Target Date 01/26/20             PT Long Term Goals - 01/30/20  1011      PT LONG TERM GOAL #1   Title Patient demonstrates & verbalizes understanding of ongoing HEP.    Time 2    Period Months    Status On-going      PT LONG TERM GOAL #2   Title FOTO score >/= 60% functional or <40% limited    Time 2    Period Months    Status On-going      PT LONG TERM GOAL #3   Title right ankle range within 10* of left ankle range.    Time 2    Period Months    Status On-going      PT LONG TERM GOAL #4   Title Patient ambulates >500' and negotiates ramps, curbs & stairs without device independently.    Time 2    Period Months    Status On-going      PT LONG TERM GOAL #5   Title Right single leg stance >10seconds    Time 2    Period Months    Status On-going      PT LONG TERM GOAL #6   Title right ankle & foot pain </= 2/10 with standing & gait activities    Baseline about 4-5 now    Time 2    Period Months    Status On-going                 Plan - 02/13/20 1038    Clinical Impression Statement She is now ambulating without AD but still with some antalgic gait and overall pain on lateral ankle. PT recommending about 3-4 more weeks of PT to address her remaining deficits.    Personal Factors and Comorbidities Comorbidity 3+    Comorbidities left subtalar & talonavicular fusion 07/21/17, DDD, HTN    Examination-Activity Limitations Lift;Locomotion Level;Squat;Stairs;Sit;Stand    Examination-Participation Musician;Occupation    Stability/Clinical Decision Making Stable/Uncomplicated    Rehab Potential Good    PT Frequency 2x / week    PT Duration Other (comment)   9 weeks   PT Treatment/Interventions ADLs/Self Care Home Management;Electrical Stimulation;Cryotherapy;DME Instruction;Gait training;Stair training;Contrast Bath;Functional mobility training;Therapeutic activities;Therapeutic exercise;Balance training;Neuromuscular re-education;Patient/family education;Manual techniques;Scar mobilization;Passive range  of motion;Dry needling;Vasopneumatic Device;Vestibular;Joint Manipulations  PT Next Visit Plan ankle range, strength, weight bearing tolerance, gait  vasopneumatic    PT Home Exercise Plan Access Code: [email or web address]    Consulted and Agree with Plan of Care Patient           Patient will benefit from skilled therapeutic intervention in order to improve the following deficits and impairments:  Abnormal gait, Decreased balance, Decreased endurance, Decreased mobility, Decreased range of motion, Decreased scar mobility, Decreased strength, Increased edema, Impaired flexibility, Pain  Visit Diagnosis: Stiffness of right ankle, not elsewhere classified  Localized edema  Pain in right ankle and joints of right foot  Other abnormalities of gait and mobility  Muscle weakness (generalized)  Unsteadiness on feet     Problem List Patient Active Problem List   Diagnosis Date Noted   Trimalleolar fracture of ankle, closed, left, initial encounter    Trimalleolar fracture of ankle, closed, right, initial encounter    Routine general medical examination at a health care facility 02/13/2019   Gastroesophageal reflux disease without esophagitis 02/13/2019   Screening for cervical cancer 02/13/2019   Visit for screening mammogram 02/13/2019   Hyperglycemia 10/05/2017   Vitamin D deficiency disease 10/05/2017   Obesity 06/08/2016   DDD (degenerative disc disease), lumbar 05/21/2014   Essential hypertension 05/21/2014    Jamie Jenkins 02/13/2020, 10:40 AM  [facility] [address] [place], [place], [address] Phone: [phone]   Fax:  [phone]  Name: Jamie Jenkins MRN: [id number] Date of Birth: February 14, 1961

## 2020-02-15 ENCOUNTER — Other Ambulatory Visit: Payer: Self-pay

## 2020-02-15 ENCOUNTER — Ambulatory Visit (INDEPENDENT_AMBULATORY_CARE_PROVIDER_SITE_OTHER): Payer: BC Managed Care – PPO | Admitting: Physical Therapy

## 2020-02-15 DIAGNOSIS — R2681 Unsteadiness on feet: Secondary | ICD-10-CM

## 2020-02-15 DIAGNOSIS — M25671 Stiffness of right ankle, not elsewhere classified: Secondary | ICD-10-CM | POA: Diagnosis not present

## 2020-02-15 DIAGNOSIS — R2689 Other abnormalities of gait and mobility: Secondary | ICD-10-CM | POA: Diagnosis not present

## 2020-02-15 DIAGNOSIS — M25571 Pain in right ankle and joints of right foot: Secondary | ICD-10-CM | POA: Diagnosis not present

## 2020-02-15 DIAGNOSIS — R600 Localized edema: Secondary | ICD-10-CM | POA: Diagnosis not present

## 2020-02-15 DIAGNOSIS — M6281 Muscle weakness (generalized): Secondary | ICD-10-CM

## 2020-02-15 NOTE — Therapy (Signed)
[facility] [address] [place], [place], [address] Phone: [phone]   Fax:  [phone]  Physical Therapy Treatment  Patient Details  Name: Jamie Jenkins MRN: [id number] Date of Birth: [birth year] Referring Provider (PT): Bevely Palmer Persons, [place]   Encounter Date: 02/15/2020   PT End of Session - 02/15/20 1026    Visit Number 16    Number of Visits 18    Authorization Type [employer] Comm PPO    PT Start Time 0932    PT Stop Time 1025    PT Time Calculation (min) 53 min    Equipment Utilized During Treatment --   CAM boot   Activity Tolerance Patient limited by pain;Patient tolerated treatment well    Behavior During Therapy [facility] for tasks assessed/performed           Past Medical History:  Diagnosis Date  . Ankle fracture    right  . DDD (degenerative disc disease), lumbar   . Gall stones   . Hypertension     Past Surgical History:  Procedure Laterality Date  . ANKLE FUSION Left 07/21/2017   Procedure: SUBTALAR AND TALONAVICULAR FUSION LEFT FOOT;  Surgeon: Newt Minion, MD;  Location: [place];  Service: Orthopedics;  Laterality: Left;  . AXILLARY LYMPH NODE DISSECTION Left 11/24/2012   Procedure: incision and drainage axillary abscess;  Surgeon: Adin Hector, MD;  Location: [place];  Service: General;  Laterality: Left;  . BREAST SURGERY  2012   breast biopsy  . CHOLECYSTECTOMY    . ORIF ANKLE FRACTURE Right 11/22/2019   Procedure: OPEN REDUCTION INTERNAL FIXATION (ORIF) RIGHT ANKLE FRACTURE;  Surgeon: Newt Minion, MD;  Location: [place];  Service: Orthopedics;  Laterality: Right;  . TUBAL LIGATION      There were no vitals filed for this visit.   Subjective Assessment - 02/15/20 0957    Subjective relays the pain is about 5/10 in her lateral foot around her incision.    Pertinent History 11/22/2019 right ankle trimalleolar fx w/ORIF, left subtalar & talonavicular fusion 07/21/17, DDD, HTN    How long can you sit comfortably? 20  minutes    How long can you stand comfortably? 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    Pain Onset More than a month ago            [facility] Adult PT Treatment/Exercise - 02/15/20 0001      Ambulation/Gait   Ambulation/Gait Yes    Ambulation/Gait Assistance 6: Modified independent (Device/Increase time)    Ambulation Distance (Feet) 75 Feet   x3   Assistive device None    Gait Pattern Step-to pattern;Decreased step length - left;Decreased stance time - right;Antalgic      Vasopneumatic   Number Minutes Vasopneumatic  10 minutes    Vasopnuematic Location  Ankle    Vasopneumatic Pressure High    Vasopneumatic Temperature  34      Manual Therapy   Manual therapy comments Rt ankle STM/scar massage/cupping to latreal Rt ankle at incision, 8 min      Ankle Exercises: Aerobic   Recumbent Bike L3 X 8 min      Ankle Exercises: Standing   Heel Raises 15 reps    Toe Raise 15 reps    Other Standing Ankle Exercises at counter top: 3 trips ea for sidestepping,  marchwalking, tandem walk fwd/retro.  Ankle Exercises: Stretches   Soleus Stretch 10 seconds   10 times on slantboard   Gastroc Stretch 30 seconds;3 reps      Ankle Exercises: Seated   BAPS Sitting;15 reps;Level 3                    PT Short Term Goals - 01/30/20 1011      PT SHORT TERM GOAL #1   Title Patient demonstrates understanding of initial HEP.    Time 1    Period Months    Status Achieved    Target Date 01/26/20      PT SHORT TERM GOAL #2   Title Patient reports right ankle pain </= 5/10 with non-weightbearing exercises.    Baseline 7/10 on avg now    Time 1    Period Months    Status Achieved    Target Date 01/26/20             PT Long Term Goals - 01/30/20 1011      PT LONG TERM GOAL #1   Title Patient demonstrates & verbalizes understanding of ongoing HEP.    Time 2      Period Months    Status On-going      PT LONG TERM GOAL #2   Title FOTO score >/= 60% functional or <40% limited    Time 2    Period Months    Status On-going      PT LONG TERM GOAL #3   Title right ankle range within 10* of left ankle range.    Time 2    Period Months    Status On-going      PT LONG TERM GOAL #4   Title Patient ambulates >500' and negotiates ramps, curbs & stairs without device independently.    Time 2    Period Months    Status On-going      PT LONG TERM GOAL #5   Title Right single leg stance >10seconds    Time 2    Period Months    Status On-going      PT LONG TERM GOAL #6   Title right ankle & foot pain </= 2/10 with standing & gait activities    Baseline about 4-5 now    Time 2    Period Months    Status On-going                 Plan - 02/15/20 1026    Clinical Impression Statement Continued to work on Rt ankle mobility and strength. Gait is slowly improving. Continue POC    Personal Factors and Comorbidities Comorbidity 3+    Comorbidities left subtalar & talonavicular fusion 07/21/17, DDD, HTN    Examination-Activity Limitations Lift;Locomotion Level;Squat;Stairs;Sit;Stand    Examination-Participation Musician;Occupation    Stability/Clinical Decision Making Stable/Uncomplicated    Rehab Potential Good    PT Frequency 2x / week    PT Duration Other (comment)   9 weeks   PT Treatment/Interventions ADLs/Self Care Home Management;Electrical Stimulation;Cryotherapy;DME Instruction;Gait training;Stair training;Contrast Bath;Functional mobility training;Therapeutic activities;Therapeutic exercise;Balance training;Neuromuscular re-education;Patient/family education;Manual techniques;Scar mobilization;Passive range of motion;Dry needling;Vasopneumatic Device;Vestibular;Joint Manipulations    PT Next Visit Plan ankle range, strength, weight bearing tolerance, gait  vasopneumatic    PT Home Exercise Plan Access Code:  [email or web address]    Consulted and Agree with Plan of Care Patient           Patient will benefit from skilled therapeutic intervention in order to improve the  following deficits and impairments:  Abnormal gait, Decreased balance, Decreased endurance, Decreased mobility, Decreased range of motion, Decreased scar mobility, Decreased strength, Increased edema, Impaired flexibility, Pain  Visit Diagnosis: Stiffness of right ankle, not elsewhere classified  Localized edema  Pain in right ankle and joints of right foot  Other abnormalities of gait and mobility  Muscle weakness (generalized)  Unsteadiness on feet     Problem List Patient Active Problem List   Diagnosis Date Noted  . Trimalleolar fracture of ankle, closed, left, initial encounter   . Trimalleolar fracture of ankle, closed, right, initial encounter   . Routine general medical examination at a health care facility 02/13/2019  . Gastroesophageal reflux disease without esophagitis 02/13/2019  . Screening for cervical cancer 02/13/2019  . Visit for screening mammogram 02/13/2019  . Hyperglycemia 10/05/2017  . Vitamin D deficiency disease 10/05/2017  . Obesity 06/08/2016  . DDD (degenerative disc disease), lumbar 05/21/2014  . Essential hypertension 05/21/2014    Silvestre Mesi 02/15/2020, 10:35 AM  [facility] [address] [place], [place], [address] Phone: [phone]   Fax:  [phone]  Name: Jamie Jenkins MRN: [id number] Date of Birth: [birth year]

## 2020-02-20 ENCOUNTER — Other Ambulatory Visit: Payer: Self-pay

## 2020-02-20 ENCOUNTER — Ambulatory Visit (INDEPENDENT_AMBULATORY_CARE_PROVIDER_SITE_OTHER): Payer: BC Managed Care – PPO | Admitting: Physical Therapy

## 2020-02-20 ENCOUNTER — Encounter: Payer: Self-pay | Admitting: Physical Therapy

## 2020-02-20 DIAGNOSIS — M25571 Pain in right ankle and joints of right foot: Secondary | ICD-10-CM | POA: Diagnosis not present

## 2020-02-20 DIAGNOSIS — M25671 Stiffness of right ankle, not elsewhere classified: Secondary | ICD-10-CM | POA: Diagnosis not present

## 2020-02-20 DIAGNOSIS — R2689 Other abnormalities of gait and mobility: Secondary | ICD-10-CM | POA: Diagnosis not present

## 2020-02-20 DIAGNOSIS — M6281 Muscle weakness (generalized): Secondary | ICD-10-CM

## 2020-02-20 DIAGNOSIS — R600 Localized edema: Secondary | ICD-10-CM | POA: Diagnosis not present

## 2020-02-20 DIAGNOSIS — R2681 Unsteadiness on feet: Secondary | ICD-10-CM

## 2020-02-20 NOTE — Therapy (Signed)
[place], [place], [address] Phone: [phone]   Fax:  [phone]  Physical Therapy Treatment  Patient Details  Name: Jamie Jenkins MRN: [id number] Date of Birth: [birth year] Referring Provider (PT): Bevely Palmer Persons, [place]   Encounter Date: 02/20/2020   PT End of Session - 02/20/20 0805    Visit Number 17    Number of Visits 18    Authorization Type [employer] Comm PPO    PT Start Time 0800    PT Stop Time 0903    PT Time Calculation (min) 63 min    Equipment Utilized During Treatment --   CAM boot   Activity Tolerance Patient limited by pain;Patient tolerated treatment well    Behavior During Therapy [facility] for tasks assessed/performed           Past Medical History:  Diagnosis Date  . Ankle fracture    right  . DDD (degenerative disc disease), lumbar   . Gall stones   . Hypertension     Past Surgical History:  Procedure Laterality Date  . ANKLE FUSION Left 07/21/2017   Procedure: SUBTALAR AND TALONAVICULAR FUSION LEFT FOOT;  Surgeon: Newt Minion, MD;  Location: [place];  Service: Orthopedics;  Laterality: Left;  . AXILLARY LYMPH NODE DISSECTION Left 11/24/2012   Procedure: incision and drainage axillary abscess;  Surgeon: Adin Hector, MD;  Location: [place];  Service: General;  Laterality: Left;  . BREAST SURGERY  2012   breast biopsy  . CHOLECYSTECTOMY    . ORIF ANKLE FRACTURE Right 11/22/2019   Procedure: OPEN REDUCTION INTERNAL FIXATION (ORIF) RIGHT ANKLE FRACTURE;  Surgeon: Newt Minion, MD;  Location: [place];  Service: Orthopedics;  Laterality: Right;  . TUBAL LIGATION      There were no vitals filed for this visit.   Subjective Assessment - 02/20/20 0800    Pertinent History 11/22/2019 right ankle trimalleolar fx w/ORIF, left subtalar & talonavicular fusion 07/21/17, DDD, HTN    How long can you sit comfortably? 20 minutes    How long can you stand comfortably? 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 Yes    Pain Score 4    In last week, highest 8/10, best 4/10   Pain Location Ankle    Pain Orientation Right;Lateral    Pain Descriptors / Indicators Sharp;Throbbing    Pain Type Acute pain    Pain Onset More than a month ago    Pain Frequency Constant    Aggravating Factors  standing & walking on it all day especially stairs.    Pain Relieving Factors better in mornings when she has been off of it              [facility] PT Assessment - 02/20/20 0800      Assessment   Medical Diagnosis Right Trimalleolar Fracture ORIF    Referring Provider (PT) Bevely Palmer Persons, PA      Observation/Other Assessments   Focus on Therapeutic Outcomes (FOTO)  [phone] Functional    Initial was 25% Functional     AROM   Right Ankle Dorsiflexion -3   seated knee flexed -3*   Right Ankle Plantar Flexion 28   seated   Left Ankle Dorsiflexion 15   seated knee flex 15*,    Left Ankle Plantar Flexion 35   seated 35*  PROM   Right Ankle Dorsiflexion 4   seated knee flexed 4*.    Right Ankle Plantar Flexion 32   seated   Left Ankle Dorsiflexion 15   seated knee flexed 15*   Left Ankle Plantar Flexion 38   seated                        [facility] Adult PT Treatment/Exercise - 02/20/20 0800      Ambulation/Gait   Ambulation/Gait Yes    Ambulation/Gait Assistance 5: Supervision;6: Modified independent (Device/Increase time)   verbal cues on form, no assistance   Ambulation/Gait Assistance Details visual with line on floor & verbal cues on proper step width / not abducting RLE.   PT also instructed in use of cane gait to be used when walking longer distances to decrease pain.  Pt verbalized & return demo understanding including when to use cane    Ambulation Distance (Feet) 75 Feet   x3   Assistive device None    Gait Pattern Decreased step length -  left;Decreased stance time - right;Antalgic;Step-through pattern;Abducted- right    Ambulation Surface Level;Indoor      Vasopneumatic   Number Minutes Vasopneumatic  10 minutes    Vasopnuematic Location  Ankle    Vasopneumatic Pressure High    Vasopneumatic Temperature  34      Manual Therapy   Manual therapy comments --      Ankle Exercises: Aerobic   Recumbent Bike L4 X 8 min seat 7      Ankle Exercises: Standing   Heel Raises --    Toe Raise --    Other Standing Ankle Exercises step up & down 4" step BUE support. 10 reps 2 sets BLEs lead LE  ankle PF pushing wt onto LE on step with step up & eccentric PF to control step down.  When RLE on step with step down she has to compensate with toes over edge of step.       Ankle Exercises: Stretches   Soleus Stretch --    Gastroc Stretch 30 seconds;3 reps    Gastroc Stretch Limitations standing on step with heel depression      Ankle Exercises: Seated   BAPS --                    PT Short Term Goals - 01/30/20 1011      PT SHORT TERM GOAL #1   Title Patient demonstrates understanding of initial HEP.    Time 1    Period Months    Status Achieved    Target Date 01/26/20      PT SHORT TERM GOAL #2   Title Patient reports right ankle pain </= 5/10 with non-weightbearing exercises.    Baseline 7/10 on avg now    Time 1    Period Months    Status Achieved    Target Date 01/26/20             PT Long Term Goals - 02/20/20 0914      PT LONG TERM GOAL #1   Title Patient demonstrates & verbalizes understanding of ongoing HEP.    Baseline 02/20/2020 Pt verbalizes understanding of HEP to date but PT continues to progress program as appropriate.    Time 2    Period Months    Status On-going      PT LONG TERM GOAL #2   Title FOTO score >/= 60% functional or <40% limited  Baseline 02/20/2020 FOTO  55.2644  improved from 25% at initial evaluation    Time 2    Period Months    Status On-going      PT LONG TERM  GOAL #3   Title right ankle range within 10* of left ankle range.    Baseline 02/20/2020 see PT assessment tab for seated ankle DF & PF PROM & AROM  improved from eval but not LTG not met    Time 2    Period Months    Status On-going      PT LONG TERM GOAL #4   Title Patient ambulates >500' and negotiates ramps, curbs & stairs without device independently.    Time 2    Period Months    Status On-going      PT LONG TERM GOAL #5   Title Right single leg stance >10seconds    Time 2    Period Months    Status On-going      PT LONG TERM GOAL #6   Title right ankle & foot pain </= 2/10 with standing & gait activities    Baseline 02/20/2020  Pt reports pain ranges from 4/10 at best up to 8/10 at end of day or standing / gait activities    Time 2    Period Months    Status On-going                 Plan - 02/20/20 0806    Clinical Impression Statement Patient has made progressed but not met LTGs.  Her FOTO score improved from 25% to 55%. Her AROM & PROM have improved. She has increased her mobility but pain, strength & range limit full function.    Personal Factors and Comorbidities Comorbidity 3+    Comorbidities left subtalar & talonavicular fusion 07/21/17, DDD, HTN    Examination-Activity Limitations Lift;Locomotion Level;Squat;Stairs;Sit;Stand    Examination-Participation Musician;Occupation    Stability/Clinical Decision Making Stable/Uncomplicated    Rehab Potential Good    PT Frequency 2x / week    PT Duration Other (comment)   9 weeks   PT Treatment/Interventions ADLs/Self Care Home Management;Electrical Stimulation;Cryotherapy;DME Instruction;Gait training;Stair training;Contrast Bath;Functional mobility training;Therapeutic activities;Therapeutic exercise;Balance training;Neuromuscular re-education;Patient/family education;Manual techniques;Scar mobilization;Passive range of motion;Dry needling;Vasopneumatic Device;Vestibular;Joint  Manipulations    PT Next Visit Plan check remaining LTGs & send recertification to MD, ankle range, strength, weight bearing tolerance, gait  vasopneumatic    PT Home Exercise Plan Access Code: [email or web address]    Consulted and Agree with Plan of Care Patient           Patient will benefit from skilled therapeutic intervention in order to improve the following deficits and impairments:  Abnormal gait, Decreased balance, Decreased endurance, Decreased mobility, Decreased range of motion, Decreased scar mobility, Decreased strength, Increased edema, Impaired flexibility, Pain  Visit Diagnosis: Stiffness of right ankle, not elsewhere classified  Localized edema  Pain in right ankle and joints of right foot  Other abnormalities of gait and mobility  Muscle weakness (generalized)  Unsteadiness on feet     Problem List Patient Active Problem List   Diagnosis Date Noted  . Trimalleolar fracture of ankle, closed, left, initial encounter   . Trimalleolar fracture of ankle, closed, right, initial encounter   . Routine general medical examination at a health care facility 02/13/2019  . Gastroesophageal reflux disease without esophagitis 02/13/2019  . Screening for cervical cancer 02/13/2019  . Visit for screening mammogram 02/13/2019  . Hyperglycemia 10/05/2017  . Vitamin D  deficiency disease 10/05/2017  . Obesity 06/08/2016  . DDD (degenerative disc disease), lumbar 05/21/2014  . Essential hypertension 05/21/2014    Jamey Reas PT, DPT 02/20/2020, 9:20 AM  [facility] Physical Therapy [address] [place], [place], [address] Phone: [phone]   Fax:  [phone]  Name: Beonka Amesquita MRN: [id number] Date of Birth: 03-11-1961

## 2020-02-22 ENCOUNTER — Ambulatory Visit (INDEPENDENT_AMBULATORY_CARE_PROVIDER_SITE_OTHER): Payer: BC Managed Care – PPO | Admitting: Physical Therapy

## 2020-02-22 ENCOUNTER — Other Ambulatory Visit: Payer: Self-pay

## 2020-02-22 ENCOUNTER — Telehealth: Payer: Self-pay | Admitting: Orthopedic Surgery

## 2020-02-22 ENCOUNTER — Other Ambulatory Visit: Payer: Self-pay | Admitting: Physician Assistant

## 2020-02-22 DIAGNOSIS — R2689 Other abnormalities of gait and mobility: Secondary | ICD-10-CM | POA: Diagnosis not present

## 2020-02-22 DIAGNOSIS — R600 Localized edema: Secondary | ICD-10-CM | POA: Diagnosis not present

## 2020-02-22 DIAGNOSIS — M25571 Pain in right ankle and joints of right foot: Secondary | ICD-10-CM

## 2020-02-22 DIAGNOSIS — M25671 Stiffness of right ankle, not elsewhere classified: Secondary | ICD-10-CM | POA: Diagnosis not present

## 2020-02-22 DIAGNOSIS — M6281 Muscle weakness (generalized): Secondary | ICD-10-CM

## 2020-02-22 MED ORDER — OXYCODONE-ACETAMINOPHEN 5-325 MG PO TABS
1.0000 | ORAL_TABLET | Freq: Three times a day (TID) | ORAL | 0 refills | Status: DC | PRN
Start: 2020-02-22 — End: 2020-08-16

## 2020-02-22 NOTE — Telephone Encounter (Signed)
Pt would like a refill of her oxycodone. Pt also stated PT wanted her to have 16 more visits and she would like to have this documented in a letter to give to her job   [phone]

## 2020-02-22 NOTE — Telephone Encounter (Signed)
Please see below and advise.

## 2020-02-22 NOTE — Telephone Encounter (Signed)
Please let her know last refill. Also PT  will document need for further PT and I will sign their note

## 2020-02-22 NOTE — Therapy (Signed)
[place], [place], [address] Phone: [phone]   Fax:  [phone]  Physical Therapy Treatment/Recert  Patient Details  Name: Jamie Jenkins MRN: [id number] Date of Birth: 1960-08-20 Referring Provider (PT): Bevely Palmer Persons, [place]   Encounter Date: 02/22/2020   PT End of Session - 02/22/20 0824    Visit Number 18    Number of Visits 30    Date for PT Re-Evaluation 04/04/20    Authorization Type [employer] Comm PPO    PT Start Time 0802    PT Stop Time 0851    PT Time Calculation (min) 49 min    Equipment Utilized During Treatment --   CAM boot   Activity Tolerance Patient limited by pain;Patient tolerated treatment well    Behavior During Therapy [facility] for tasks assessed/performed           Past Medical History:  Diagnosis Date  . Ankle fracture    right  . DDD (degenerative disc disease), lumbar   . Gall stones   . Hypertension     Past Surgical History:  Procedure Laterality Date  . ANKLE FUSION Left 07/21/2017   Procedure: SUBTALAR AND TALONAVICULAR FUSION LEFT FOOT;  Surgeon: Newt Minion, MD;  Location: [place];  Service: Orthopedics;  Laterality: Left;  . AXILLARY LYMPH NODE DISSECTION Left 11/24/2012   Procedure: incision and drainage axillary abscess;  Surgeon: Adin Hector, MD;  Location: [place];  Service: General;  Laterality: Left;  . BREAST SURGERY  2012   breast biopsy  . CHOLECYSTECTOMY    . ORIF ANKLE FRACTURE Right 11/22/2019   Procedure: OPEN REDUCTION INTERNAL FIXATION (ORIF) RIGHT ANKLE FRACTURE;  Surgeon: Newt Minion, MD;  Location: [place];  Service: Orthopedics;  Laterality: Right;  . TUBAL LIGATION      There were no vitals filed for this visit.   Subjective Assessment - 02/22/20 0816    Subjective relays aobut 8/10 pain today in her ankle, also pain and discomfort with sleeping    Pertinent History 11/22/2019 right ankle trimalleolar fx w/ORIF, left subtalar & talonavicular fusion  07/21/17, DDD, HTN    How long can you sit comfortably? 20 minutes    How long can you stand comfortably? 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    Pain Onset More than a month ago              [facility] PT Assessment - 02/22/20 0001      Assessment   Medical Diagnosis Right Trimalleolar Fracture ORIF    Referring Provider (PT) Bevely Palmer Persons, PA      Observation/Other Assessments   Focus on Therapeutic Outcomes (FOTO)  [phone] Functional    Initial was 25% Functional     AROM   Right Ankle Dorsiflexion -3   seated knee flexed -3*   Right Ankle Plantar Flexion 28   seated   Left Ankle Dorsiflexion 15   seated knee flex 15*,    Left Ankle Plantar Flexion 35   seated 35*     PROM   Right Ankle Dorsiflexion 4   seated knee flexed 4*.    Right Ankle Plantar Flexion 32   seated   Left Ankle Dorsiflexion 15   seated knee flexed 15*   Left Ankle Plantar Flexion 38   seated     Ambulation/Gait   Ambulation/Gait Assistance 5:  Supervision;6: Modified independent (Device/Increase time)   verbal cues on form, no assistance   Ambulation Distance (Feet) 75 Feet   x3   Assistive device None                         [facility] Adult PT Treatment/Exercise - 02/22/20 0001      Ambulation/Gait   Ambulation/Gait Yes    Gait Pattern Decreased step length - left;Decreased stance time - right;Antalgic;Step-through pattern;Abducted- right      Neuro Re-ed    Neuro Re-ed Details  SLS 10 attemps on Rt 2-7 sec holds      Vasopneumatic   Number Minutes Vasopneumatic  10 minutes    Vasopnuematic Location  Ankle    Vasopneumatic Pressure High    Vasopneumatic Temperature  34      Ankle Exercises: Aerobic   Nustep seat 9 level 5  8 minutes      Ankle Exercises: Stretches   Soleus Stretch 10 seconds   10 reps   Gastroc Stretch 30 seconds;3 reps      Ankle Exercises:  Standing   Heel Raises 10 reps   2 sets with heels off step   Other Standing Ankle Exercises 4 inch step ups and step downs X 15 ea                    PT Short Term Goals - 01/30/20 1011      PT SHORT TERM GOAL #1   Title Patient demonstrates understanding of initial HEP.    Time 1    Period Months    Status Achieved    Target Date 01/26/20      PT SHORT TERM GOAL #2   Title Patient reports right ankle pain </= 5/10 with non-weightbearing exercises.    Baseline 7/10 on avg now    Time 1    Period Months    Status Achieved    Target Date 01/26/20             PT Long Term Goals - 02/22/20 0846      PT LONG TERM GOAL #1   Title Patient demonstrates & verbalizes understanding of ongoing HEP.    Baseline 02/20/2020 Pt verbalizes understanding of HEP to date but PT continues to progress program as appropriate.    Time 2    Period Months    Status On-going      PT LONG TERM GOAL #2   Title FOTO score >/= 60% functional or <40% limited    Baseline 02/20/2020 FOTO  55.2644  improved from 25% at initial evaluation    Time 2    Period Months    Status On-going      PT LONG TERM GOAL #3   Title right ankle range within 10* of left ankle range.    Baseline 02/20/2020 see PT assessment tab for seated ankle DF & PF PROM & AROM  improved from eval but not LTG not met    Time 2    Period Months    Status On-going      PT LONG TERM GOAL #4   Title Patient ambulates >500' and negotiates ramps, curbs & stairs without device independently.    Baseline can not yet do stairs    Time 2    Period Months    Status On-going      PT LONG TERM GOAL #5   Title Right single leg stance >10seconds  Baseline 7 sec at best    Time 2    Period Months    Status On-going      PT LONG TERM GOAL #6   Title right ankle & foot pain </= 2/10 with standing & gait activities    Baseline 02/20/2020  Pt reports pain ranges from 4/10 at best up to 8/10 at end of day or standing / gait  activities    Time 2    Period Months    Status On-going                 Plan - 02/22/20 0841    Clinical Impression Statement Recert today as she is at visit 18 of 18 of initial POC. She has made progress with PT but has not yet met all long term goals and continues to be limited by ankle ROM, ankle strength, difficulty with standing/walking activity, and increased pain and swelling. PT recommending 6 more weeks of PT to address these functional deficits.    Personal Factors and Comorbidities Comorbidity 3+    Comorbidities left subtalar & talonavicular fusion 07/21/17, DDD, HTN    Examination-Activity Limitations Lift;Locomotion Level;Squat;Stairs;Sit;Stand    Examination-Participation Musician;Occupation    Stability/Clinical Decision Making Stable/Uncomplicated    Rehab Potential Good    PT Frequency 2x / week    PT Duration Other (comment)   9 weeks   PT Treatment/Interventions ADLs/Self Care Home Management;Electrical Stimulation;Cryotherapy;DME Instruction;Gait training;Stair training;Contrast Bath;Functional mobility training;Therapeutic activities;Therapeutic exercise;Balance training;Neuromuscular re-education;Patient/family education;Manual techniques;Scar mobilization;Passive range of motion;Dry needling;Vasopneumatic Device;Vestibular;Joint Manipulations    PT Next Visit Plan check remaining LTGs & send recertification to MD, ankle range, strength, weight bearing tolerance, gait  vasopneumatic    PT Home Exercise Plan Access Code: [email or web address]    Consulted and Agree with Plan of Care Patient           Patient will benefit from skilled therapeutic intervention in order to improve the following deficits and impairments:  Abnormal gait,Decreased balance,Decreased endurance,Decreased mobility,Decreased range of motion,Decreased scar mobility,Decreased strength,Increased edema,Impaired flexibility,Pain  Visit Diagnosis: Stiffness of right ankle, not  elsewhere classified  Localized edema  Pain in right ankle and joints of right foot  Other abnormalities of gait and mobility  Muscle weakness (generalized)     Problem List Patient Active Problem List   Diagnosis Date Noted  . Trimalleolar fracture of ankle, closed, left, initial encounter   . Trimalleolar fracture of ankle, closed, right, initial encounter   . Routine general medical examination at a health care facility 02/13/2019  . Gastroesophageal reflux disease without esophagitis 02/13/2019  . Screening for cervical cancer 02/13/2019  . Visit for screening mammogram 02/13/2019  . Hyperglycemia 10/05/2017  . Vitamin D deficiency disease 10/05/2017  . Obesity 06/08/2016  . DDD (degenerative disc disease), lumbar 05/21/2014  . Essential hypertension 05/21/2014    Silvestre Mesi 02/22/2020, 8:48 AM  [facility] Physical Therapy [address] [place], [place], [address] Phone: [phone]   Fax:  [phone]  Name: Jamie Jenkins MRN: [id number] Date of Birth: [birth year]

## 2020-02-22 NOTE — Telephone Encounter (Signed)
I called and lm on vm to advise of message

## 2020-02-27 ENCOUNTER — Encounter: Payer: Self-pay | Admitting: Physical Therapy

## 2020-02-27 ENCOUNTER — Ambulatory Visit (INDEPENDENT_AMBULATORY_CARE_PROVIDER_SITE_OTHER): Payer: BC Managed Care – PPO | Admitting: Physical Therapy

## 2020-02-27 ENCOUNTER — Other Ambulatory Visit: Payer: Self-pay

## 2020-02-27 DIAGNOSIS — M25571 Pain in right ankle and joints of right foot: Secondary | ICD-10-CM

## 2020-02-27 DIAGNOSIS — M25671 Stiffness of right ankle, not elsewhere classified: Secondary | ICD-10-CM

## 2020-02-27 DIAGNOSIS — R600 Localized edema: Secondary | ICD-10-CM

## 2020-02-27 DIAGNOSIS — M6281 Muscle weakness (generalized): Secondary | ICD-10-CM

## 2020-02-27 DIAGNOSIS — R2689 Other abnormalities of gait and mobility: Secondary | ICD-10-CM | POA: Diagnosis not present

## 2020-02-27 NOTE — Therapy (Signed)
[place], [place], [address] Phone: [phone]   Fax:  [phone]  Physical Therapy Treatment  Patient Details  Name: Jamie Jenkins MRN: [id number] Date of Birth: November 20, 1960 Referring Provider (PT): Bevely Palmer Persons, [place]   Encounter Date: 02/27/2020   PT End of Session - 02/27/20 0840    Visit Number 19    Number of Visits 30    Date for PT Re-Evaluation 04/04/20    Authorization Type [employer] Comm PPO    PT Start Time 0800    PT Stop Time 0845    PT Time Calculation (min) 45 min    Equipment Utilized During Treatment --   CAM boot   Activity Tolerance Patient limited by pain;Patient tolerated treatment well    Behavior During Therapy [facility] for tasks assessed/performed           Past Medical History:  Diagnosis Date  . Ankle fracture    right  . DDD (degenerative disc disease), lumbar   . Gall stones   . Hypertension     Past Surgical History:  Procedure Laterality Date  . ANKLE FUSION Left 07/21/2017   Procedure: SUBTALAR AND TALONAVICULAR FUSION LEFT FOOT;  Surgeon: Newt Minion, MD;  Location: [place];  Service: Orthopedics;  Laterality: Left;  . AXILLARY LYMPH NODE DISSECTION Left 11/24/2012   Procedure: incision and drainage axillary abscess;  Surgeon: Adin Hector, MD;  Location: [place];  Service: General;  Laterality: Left;  . BREAST SURGERY  2012   breast biopsy  . CHOLECYSTECTOMY    . ORIF ANKLE FRACTURE Right 11/22/2019   Procedure: OPEN REDUCTION INTERNAL FIXATION (ORIF) RIGHT ANKLE FRACTURE;  Surgeon: Newt Minion, MD;  Location: [place];  Service: Orthopedics;  Laterality: Right;  . TUBAL LIGATION      There were no vitals filed for this visit.   Subjective Assessment - 02/27/20 0815    Subjective She relays pain is better today in Rt ankle, down to a 4, but she has more overall swelling and could not get her sock on.    Pertinent History 11/22/2019 right ankle trimalleolar fx w/ORIF, left  subtalar & talonavicular fusion 07/21/17, DDD, HTN    How long can you sit comfortably? 20 minutes    How long can you stand comfortably? 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    Pain Onset More than a month ago            [facility] Adult PT Treatment/Exercise - 02/27/20 0001      Neuro Re-ed    Neuro Re-ed Details  SLS 10 sec on Rt X 5 reps, tandem walk, march walk      Vasopneumatic   Number Minutes Vasopneumatic  10 minutes    Vasopnuematic Location  Ankle    Vasopneumatic Pressure High    Vasopneumatic Temperature  34      Manual Therapy   Manual therapy comments Rt ankle PROM      Ankle Exercises: Aerobic   Nustep seat 9 level 5  8 minutes      Ankle Exercises: Standing   Rocker Board 1 minute   lateral and A-P   Heel Raises Limitations Heel and toe raises with UE support 2 sets of 10    Other Standing Ankle Exercises 4 inch step ups and step downs X 15 ea  Ankle Exercises: Stretches   Soleus Stretch 10 seconds   slantboard   Gastroc Stretch 30 seconds;3 reps   slantboard                   PT Short Term Goals - 01/30/20 1011      PT SHORT TERM GOAL #1   Title Patient demonstrates understanding of initial HEP.    Time 1    Period Months    Status Achieved    Target Date 01/26/20      PT SHORT TERM GOAL #2   Title Patient reports right ankle pain </= 5/10 with non-weightbearing exercises.    Baseline 7/10 on avg now    Time 1    Period Months    Status Achieved    Target Date 01/26/20             PT Long Term Goals - 02/22/20 0846      PT LONG TERM GOAL #1   Title Patient demonstrates & verbalizes understanding of ongoing HEP.    Baseline 02/20/2020 Pt verbalizes understanding of HEP to date but PT continues to progress program as appropriate.    Time 2    Period Months    Status On-going      PT LONG TERM GOAL #2   Title  FOTO score >/= 60% functional or <40% limited    Baseline 02/20/2020 FOTO  55.2644  improved from 25% at initial evaluation    Time 2    Period Months    Status On-going      PT LONG TERM GOAL #3   Title right ankle range within 10* of left ankle range.    Baseline 02/20/2020 see PT assessment tab for seated ankle DF & PF PROM & AROM  improved from eval but not LTG not met    Time 2    Period Months    Status On-going      PT LONG TERM GOAL #4   Title Patient ambulates >500' and negotiates ramps, curbs & stairs without device independently.    Baseline can not yet do stairs    Time 2    Period Months    Status On-going      PT LONG TERM GOAL #5   Title Right single leg stance >10seconds    Baseline 7 sec at best    Time 2    Period Months    Status On-going      PT LONG TERM GOAL #6   Title right ankle & foot pain </= 2/10 with standing & gait activities    Baseline 02/20/2020  Pt reports pain ranges from 4/10 at best up to 8/10 at end of day or standing / gait activities    Time 2    Period Months    Status On-going                 Plan - 02/27/20 0841    Clinical Impression Statement She was able to progress standing actiivites today as her pain levels were down but she is still very sensitive around her lateral incision to light touch. PT will continue to work to improve funciton as tolerated.    Personal Factors and Comorbidities Comorbidity 3+    Comorbidities left subtalar & talonavicular fusion 07/21/17, DDD, HTN    Examination-Activity Limitations Lift;Locomotion Level;Squat;Stairs;Sit;Stand    Examination-Participation Musician;Occupation    Stability/Clinical Decision Making Stable/Uncomplicated    Rehab Potential Good  PT Frequency 2x / week    PT Duration Other (comment)   9 weeks   PT Treatment/Interventions ADLs/Self Care Home Management;Electrical Stimulation;Cryotherapy;DME Instruction;Gait training;Stair  training;Contrast Bath;Functional mobility training;Therapeutic activities;Therapeutic exercise;Balance training;Neuromuscular re-education;Patient/family education;Manual techniques;Scar mobilization;Passive range of motion;Dry needling;Vasopneumatic Device;Vestibular;Joint Manipulations    PT Next Visit Plan check remaining LTGs & send recertification to MD, ankle range, strength, weight bearing tolerance, gait  vasopneumatic    PT Home Exercise Plan Access Code: [email or web address]    Consulted and Agree with Plan of Care Patient           Patient will benefit from skilled therapeutic intervention in order to improve the following deficits and impairments:  Abnormal gait,Decreased balance,Decreased endurance,Decreased mobility,Decreased range of motion,Decreased scar mobility,Decreased strength,Increased edema,Impaired flexibility,Pain  Visit Diagnosis: Stiffness of right ankle, not elsewhere classified  Localized edema  Pain in right ankle and joints of right foot  Other abnormalities of gait and mobility  Muscle weakness (generalized)     Problem List Patient Active Problem List   Diagnosis Date Noted  . Trimalleolar fracture of ankle, closed, left, initial encounter   . Trimalleolar fracture of ankle, closed, right, initial encounter   . Routine general medical examination at a health care facility 02/13/2019  . Gastroesophageal reflux disease without esophagitis 02/13/2019  . Screening for cervical cancer 02/13/2019  . Visit for screening mammogram 02/13/2019  . Hyperglycemia 10/05/2017  . Vitamin D deficiency disease 10/05/2017  . Obesity 06/08/2016  . DDD (degenerative disc disease), lumbar 05/21/2014  . Essential hypertension 05/21/2014    Silvestre Mesi 02/27/2020, 8:43 AM  [facility] Physical Therapy [address] [place], [place], [address] Phone: [phone]   Fax:  [phone]  Name: Teriah Muela MRN: [id number] Date of Birth:  [birth year]

## 2020-03-01 ENCOUNTER — Other Ambulatory Visit: Payer: Self-pay

## 2020-03-01 ENCOUNTER — Ambulatory Visit (INDEPENDENT_AMBULATORY_CARE_PROVIDER_SITE_OTHER): Payer: BC Managed Care – PPO | Admitting: Physical Therapy

## 2020-03-01 DIAGNOSIS — M25671 Stiffness of right ankle, not elsewhere classified: Secondary | ICD-10-CM

## 2020-03-01 DIAGNOSIS — R600 Localized edema: Secondary | ICD-10-CM | POA: Diagnosis not present

## 2020-03-01 DIAGNOSIS — M25571 Pain in right ankle and joints of right foot: Secondary | ICD-10-CM

## 2020-03-01 DIAGNOSIS — R2689 Other abnormalities of gait and mobility: Secondary | ICD-10-CM

## 2020-03-01 NOTE — Therapy (Signed)
[facility] [place], [place], [address] Phone: [phone]   Fax:  [phone]  Physical Therapy Treatment  Patient Details  Name: Jamie Jenkins MRN: [id number] Date of Birth: [birth year] Referring Provider (PT): Bevely Palmer Persons, [place]   Encounter Date: 03/01/2020   PT End of Session - 03/01/20 0845    Visit Number 20    Number of Visits 30    Date for PT Re-Evaluation 04/04/20    Authorization Type [employer]BS Comm PPO    PT Start Time 0803    PT Stop Time 0852    PT Time Calculation (min) 49 min    Equipment Utilized During Treatment --   CAM boot   Activity Tolerance Patient limited by pain;Patient tolerated treatment well    Behavior During Therapy [facility] for tasks assessed/performed           Past Medical History:  Diagnosis Date  . Ankle fracture    right  . DDD (degenerative disc disease), lumbar   . Gall stones   . Hypertension     Past Surgical History:  Procedure Laterality Date  . ANKLE FUSION Left 07/21/2017   Procedure: SUBTALAR AND TALONAVICULAR FUSION LEFT FOOT;  Surgeon: Newt Minion, MD;  Location: [place];  Service: Orthopedics;  Laterality: Left;  . AXILLARY LYMPH NODE DISSECTION Left 11/24/2012   Procedure: incision and drainage axillary abscess;  Surgeon: Adin Hector, MD;  Location: [place];  Service: General;  Laterality: Left;  . BREAST SURGERY  2012   breast biopsy  . CHOLECYSTECTOMY    . ORIF ANKLE FRACTURE Right 11/22/2019   Procedure: OPEN REDUCTION INTERNAL FIXATION (ORIF) RIGHT ANKLE FRACTURE;  Surgeon: Newt Minion, MD;  Location: [place];  Service: Orthopedics;  Laterality: Right;  . TUBAL LIGATION      There were no vitals filed for this visit.   Subjective Assessment - 03/01/20 0824    Subjective She relays pain is about 4/10 in her Rt ankle today    Pertinent History 11/22/2019 right ankle trimalleolar fx w/ORIF, left subtalar & talonavicular fusion 07/21/17, DDD, HTN    How long can you sit  comfortably? 20 minutes    How long can you stand comfortably? 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    Pain Onset More than a month ago            [facility] Adult PT Treatment/Exercise - 03/01/20 0001      Neuro Re-ed    Neuro Re-ed Details  SLS 10 sec X 5 reps, tandem walk      Vasopneumatic   Number Minutes Vasopneumatic  10 minutes    Vasopnuematic Location  Ankle    Vasopneumatic Pressure High    Vasopneumatic Temperature  34      Ankle Exercises: Aerobic   Recumbent Bike L3 X 5 min      Ankle Exercises: Standing   Rocker Board --   90 sec A-P, 90 sec lateral with UE support   Heel Raises Limitations Heel and toe raises with UE support 2 sets of 10    Other Standing Ankle Exercises 6 inch step ups X 10 on Rt, 6 inch lateral step downs X 10      Ankle Exercises: Stretches   Soleus Stretch 10 seconds   10 reps slantboard   Gastroc Stretch 3 reps;30 seconds  Ankle Exercises: Supine   T-Band green X 15 4 way   difficulty with INV            PT Short Term Goals - 01/30/20 1011      PT SHORT TERM GOAL #1   Title Patient demonstrates understanding of initial HEP.    Time 1    Period Months    Status Achieved    Target Date 01/26/20      PT SHORT TERM GOAL #2   Title Patient reports right ankle pain </= 5/10 with non-weightbearing exercises.    Baseline 7/10 on avg now    Time 1    Period Months    Status Achieved    Target Date 01/26/20             PT Long Term Goals - 02/22/20 0846      PT LONG TERM GOAL #1   Title Patient demonstrates & verbalizes understanding of ongoing HEP.    Baseline 02/20/2020 Pt verbalizes understanding of HEP to date but PT continues to progress program as appropriate.    Time 2    Period Months    Status On-going      PT LONG TERM GOAL #2   Title FOTO score >/= 60% functional or <40% limited    Baseline  02/20/2020 FOTO  55.2644  improved from 25% at initial evaluation    Time 2    Period Months    Status On-going      PT LONG TERM GOAL #3   Title right ankle range within 10* of left ankle range.    Baseline 02/20/2020 see PT assessment tab for seated ankle DF & PF PROM & AROM  improved from eval but not LTG not met    Time 2    Period Months    Status On-going      PT LONG TERM GOAL #4   Title Patient ambulates >500' and negotiates ramps, curbs & stairs without device independently.    Baseline can not yet do stairs    Time 2    Period Months    Status On-going      PT LONG TERM GOAL #5   Title Right single leg stance >10seconds    Baseline 7 sec at best    Time 2    Period Months    Status On-going      PT LONG TERM GOAL #6   Title right ankle & foot pain </= 2/10 with standing & gait activities    Baseline 02/20/2020  Pt reports pain ranges from 4/10 at best up to 8/10 at end of day or standing / gait activities    Time 2    Period Months    Status On-going                 Plan - 03/01/20 0846    Clinical Impression Statement She had good activity tolerance to session but still has pain and tighntess in latreal ankle around incision and pain/edema with prolonged standing. PT will continue to progress this as she can tolerate.    Personal Factors and Comorbidities Comorbidity 3+    Comorbidities left subtalar & talonavicular fusion 07/21/17, DDD, HTN    Examination-Activity Limitations Lift;Locomotion Level;Squat;Stairs;Sit;Stand    Examination-Participation Musician;Occupation    Stability/Clinical Decision Making Stable/Uncomplicated    Rehab Potential Good    PT Frequency 2x / week    PT Duration Other (comment)   9 weeks  PT Treatment/Interventions ADLs/Self Care Home Management;Electrical Stimulation;Cryotherapy;DME Instruction;Gait training;Stair training;Contrast Bath;Functional mobility training;Therapeutic activities;Therapeutic  exercise;Balance training;Neuromuscular re-education;Patient/family education;Manual techniques;Scar mobilization;Passive range of motion;Dry needling;Vasopneumatic Device;Vestibular;Joint Manipulations    PT Next Visit Plan ankle range, strength, weight bearing tolerance, gait  vasopneumatic    PT Home Exercise Plan Access Code: [email or web address]    Consulted and Agree with Plan of Care Patient           Patient will benefit from skilled therapeutic intervention in order to improve the following deficits and impairments:  Abnormal gait,Decreased balance,Decreased endurance,Decreased mobility,Decreased range of motion,Decreased scar mobility,Decreased strength,Increased edema,Impaired flexibility,Pain  Visit Diagnosis: Stiffness of right ankle, not elsewhere classified  Localized edema  Pain in right ankle and joints of right foot  Other abnormalities of gait and mobility     Problem List Patient Active Problem List   Diagnosis Date Noted  . Trimalleolar fracture of ankle, closed, left, initial encounter   . Trimalleolar fracture of ankle, closed, right, initial encounter   . Routine general medical examination at a health care facility 02/13/2019  . Gastroesophageal reflux disease without esophagitis 02/13/2019  . Screening for cervical cancer 02/13/2019  . Visit for screening mammogram 02/13/2019  . Hyperglycemia 10/05/2017  . Vitamin D deficiency disease 10/05/2017  . Obesity 06/08/2016  . DDD (degenerative disc disease), lumbar 05/21/2014  . Essential hypertension 05/21/2014    Silvestre Mesi 03/01/2020, 8:48 AM  [facility] Physical Therapy [address] [place], [place], [address] Phone: [phone]   Fax:  [phone]  Name: Jamie Jenkins MRN: [id number] Date of Birth: 17-Feb-1961

## 2020-03-05 ENCOUNTER — Other Ambulatory Visit: Payer: Self-pay | Admitting: Internal Medicine

## 2020-03-05 ENCOUNTER — Telehealth: Payer: Self-pay | Admitting: Physical Therapy

## 2020-03-05 ENCOUNTER — Encounter: Payer: BC Managed Care – PPO | Admitting: Physical Therapy

## 2020-03-05 DIAGNOSIS — I10 Essential (primary) hypertension: Secondary | ICD-10-CM

## 2020-03-05 NOTE — Telephone Encounter (Signed)
PT called due to no show for appt & left message on voicemail.

## 2020-03-06 ENCOUNTER — Other Ambulatory Visit: Payer: Self-pay

## 2020-03-06 ENCOUNTER — Ambulatory Visit (INDEPENDENT_AMBULATORY_CARE_PROVIDER_SITE_OTHER): Payer: BC Managed Care – PPO | Admitting: Physical Therapy

## 2020-03-06 DIAGNOSIS — R2689 Other abnormalities of gait and mobility: Secondary | ICD-10-CM | POA: Diagnosis not present

## 2020-03-06 DIAGNOSIS — M6281 Muscle weakness (generalized): Secondary | ICD-10-CM

## 2020-03-06 DIAGNOSIS — R600 Localized edema: Secondary | ICD-10-CM

## 2020-03-06 DIAGNOSIS — M25671 Stiffness of right ankle, not elsewhere classified: Secondary | ICD-10-CM

## 2020-03-06 DIAGNOSIS — M25571 Pain in right ankle and joints of right foot: Secondary | ICD-10-CM | POA: Diagnosis not present

## 2020-03-06 DIAGNOSIS — R2681 Unsteadiness on feet: Secondary | ICD-10-CM

## 2020-03-06 NOTE — Therapy (Signed)
[facility] Physical Therapy [address] [place], [place], [address] Phone: [phone]   Fax:  [phone]  Physical Therapy Treatment  Patient Details  Name: Jamie Jenkins MRN: [id number] Date of Birth: [birth year] Referring Provider (PT): Bevely Palmer Persons, [place]   Encounter Date: 03/06/2020   PT End of Session - 03/06/20 0845    Visit Number 21    Number of Visits 30    Date for PT Re-Evaluation 04/04/20    Authorization Type [employer]BS Comm PPO    PT Start Time 0802    PT Stop Time 4628    PT Time Calculation (min) 45 min    Equipment Utilized During Treatment --   CAM boot   Activity Tolerance Patient limited by pain;Patient tolerated treatment well    Behavior During Therapy [facility] for tasks assessed/performed           Past Medical History:  Diagnosis Date  . Ankle fracture    right  . DDD (degenerative disc disease), lumbar   . Gall stones   . Hypertension     Past Surgical History:  Procedure Laterality Date  . ANKLE FUSION Left 07/21/2017   Procedure: SUBTALAR AND TALONAVICULAR FUSION LEFT FOOT;  Surgeon: Newt Minion, MD;  Location: [place];  Service: Orthopedics;  Laterality: Left;  . AXILLARY LYMPH NODE DISSECTION Left 11/24/2012   Procedure: incision and drainage axillary abscess;  Surgeon: Adin Hector, MD;  Location: [place];  Service: General;  Laterality: Left;  . BREAST SURGERY  2012   breast biopsy  . CHOLECYSTECTOMY    . ORIF ANKLE FRACTURE Right 11/22/2019   Procedure: OPEN REDUCTION INTERNAL FIXATION (ORIF) RIGHT ANKLE FRACTURE;  Surgeon: Newt Minion, MD;  Location: [place];  Service: Orthopedics;  Laterality: Right;  . TUBAL LIGATION      There were no vitals filed for this visit.   Subjective Assessment - 03/06/20 0821    Subjective She relays pain is 4/10, more stiffness today upon arrival    Pertinent History 11/22/2019 right ankle trimalleolar fx w/ORIF, left subtalar & talonavicular fusion 07/21/17, DDD, HTN    How long can  you sit comfortably? 20 minutes    How long can you stand comfortably? 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    Pain Onset More than a month ago              [facility] PT Assessment - 03/06/20 0001      Assessment   Medical Diagnosis Right Trimalleolar Fracture ORIF    Referring Provider (PT) Bevely Palmer Persons, PA      AROM   Right Ankle Dorsiflexion 3    Right Ankle Plantar Flexion 35    Right Ankle Inversion 10    Right Ankle Eversion 15      Strength   Overall Strength Comments tested in supine    Right Ankle Dorsiflexion 4+/5    Right Ankle Plantar Flexion 4/5    Right Ankle Inversion 4/5    Right Ankle Eversion 4/5           [facility] Adult PT Treatment/Exercise - 03/06/20 0001      Neuro Re-ed    Neuro Re-ed Details  SLS 30 sec X 2 with intermitt UE support PRN, tandem walk 3 trips, lateral stepping over cones 3 trips      Vasopneumatic   Number Minutes Vasopneumatic  10 minutes    Vasopnuematic Location  Ankle    Vasopneumatic Pressure High    Vasopneumatic Temperature  34      Manual Therapy   Manual therapy comments Rt ankle PROM      Ankle Exercises: Standing   Heel Raises Limitations Heel and toe raises with UE support 2 sets of 10    Other Standing Ankle Exercises 4 inch step downs/ups leaving Rt foot on step X 10 reps, one UE support      Ankle Exercises: Aerobic   Nustep L6 X 8 min      Ankle Exercises: Stretches   Soleus Stretch 10 seconds   10 reps on slantboard   Gastroc Stretch 3 reps;30 seconds                    PT Short Term Goals - 03/06/20 0906      PT SHORT TERM GOAL #1   Title Patient demonstrates understanding of initial HEP.    Time 1    Period Months    Status Achieved    Target Date 01/26/20      PT SHORT TERM GOAL #2   Title Patient reports right ankle pain </= 5/10 with non-weightbearing exercises.     Baseline 7/10 on avg now    Time 1    Period Months    Status Achieved    Target Date 01/26/20             PT Long Term Goals - 03/06/20 0906      PT LONG TERM GOAL #1   Title Patient demonstrates & verbalizes understanding of ongoing HEP.    Baseline 1222/2021 Pt verbalizes understanding of HEP to date but PT continues to progress program as appropriate.    Time 2    Period Months    Status On-going      PT LONG TERM GOAL #2   Title FOTO score >/= 60% functional or <40% limited    Baseline 02/20/2020 FOTO  55.2644  improved from 25% at initial evaluation    Time 2    Period Months    Status On-going      PT LONG TERM GOAL #3   Title right ankle range within 10* of left ankle range.    Baseline 02/20/2020 see PT assessment tab for seated ankle DF & PF PROM & AROM  improved from eval but not LTG not met    Time 2    Period Months    Status On-going      PT LONG TERM GOAL #4   Title Patient ambulates >500' and negotiates ramps, curbs & stairs without device independently.    Baseline can not yet do stairs    Time 2    Period Months    Status On-going      PT LONG TERM GOAL #5   Title Right single leg stance >10seconds    Baseline 7 sec at best    Time 2    Period Months    Status On-going      PT LONG TERM GOAL #6   Title right ankle & foot pain </= 2/10 with standing & gait activities    Baseline 02/20/2020  Pt reports pain ranges from 4/10 at best for standing / gait activities    Time 2    Period Months    Status On-going                 Plan -  03/06/20 0848    Clinical Impression Statement Updated measurments show progress with Rt ankle strength, ROM, and overall balance but still with deficits in these areas and difficulty with gait and stairs. She will continue to benefit from skilled PT to address these deficits.    Personal Factors and Comorbidities Comorbidity 3+    Comorbidities left subtalar & talonavicular fusion 07/21/17, DDD, HTN     Examination-Activity Limitations Lift;Locomotion Level;Squat;Stairs;Sit;Stand    Examination-Participation Musician;Occupation    Stability/Clinical Decision Making Stable/Uncomplicated    Rehab Potential Good    PT Frequency 2x / week    PT Duration Other (comment)   9 weeks   PT Treatment/Interventions ADLs/Self Care Home Management;Electrical Stimulation;Cryotherapy;DME Instruction;Gait training;Stair training;Contrast Bath;Functional mobility training;Therapeutic activities;Therapeutic exercise;Balance training;Neuromuscular re-education;Patient/family education;Manual techniques;Scar mobilization;Passive range of motion;Dry needling;Vasopneumatic Device;Vestibular;Joint Manipulations    PT Next Visit Plan ankle range, strength, weight bearing tolerance, gait  vasopneumatic    PT Home Exercise Plan Access Code: [email or web address]    Consulted and Agree with Plan of Care Patient           Patient will benefit from skilled therapeutic intervention in order to improve the following deficits and impairments:  Abnormal gait,Decreased balance,Decreased endurance,Decreased mobility,Decreased range of motion,Decreased scar mobility,Decreased strength,Increased edema,Impaired flexibility,Pain  Visit Diagnosis: Stiffness of right ankle, not elsewhere classified  Localized edema  Pain in right ankle and joints of right foot  Other abnormalities of gait and mobility  Muscle weakness (generalized)  Unsteadiness on feet     Problem List Patient Active Problem List   Diagnosis Date Noted  . Trimalleolar fracture of ankle, closed, left, initial encounter   . Trimalleolar fracture of ankle, closed, right, initial encounter   . Routine general medical examination at a health care facility 02/13/2019  . Gastroesophageal reflux disease without esophagitis 02/13/2019  . Screening for cervical cancer 02/13/2019  . Visit for screening mammogram 02/13/2019  . Hyperglycemia  10/05/2017  . Vitamin D deficiency disease 10/05/2017  . Obesity 06/08/2016  . DDD (degenerative disc disease), lumbar 05/21/2014  . Essential hypertension 05/21/2014    Silvestre Mesi 03/06/2020, 9:09 AM  [facility] Physical Therapy [address] [place], [place], [address] Phone: [phone]   Fax:  [phone]  Name: Jamie Jenkins MRN: [id number] Date of Birth: [birth year]

## 2020-03-11 ENCOUNTER — Encounter: Payer: Self-pay | Admitting: Internal Medicine

## 2020-03-11 ENCOUNTER — Other Ambulatory Visit: Payer: Self-pay

## 2020-03-11 ENCOUNTER — Ambulatory Visit (INDEPENDENT_AMBULATORY_CARE_PROVIDER_SITE_OTHER): Payer: BC Managed Care – PPO | Admitting: Physical Therapy

## 2020-03-11 ENCOUNTER — Ambulatory Visit (INDEPENDENT_AMBULATORY_CARE_PROVIDER_SITE_OTHER): Payer: BC Managed Care – PPO | Admitting: Internal Medicine

## 2020-03-11 VITALS — BP 142/92 | HR 62 | Temp 98.3°F | Ht 67.0 in | Wt 208.0 lb

## 2020-03-11 DIAGNOSIS — Z87310 Personal history of (healed) osteoporosis fracture: Secondary | ICD-10-CM

## 2020-03-11 DIAGNOSIS — M25571 Pain in right ankle and joints of right foot: Secondary | ICD-10-CM

## 2020-03-11 DIAGNOSIS — Z0001 Encounter for general adult medical examination with abnormal findings: Secondary | ICD-10-CM

## 2020-03-11 DIAGNOSIS — R600 Localized edema: Secondary | ICD-10-CM | POA: Diagnosis not present

## 2020-03-11 DIAGNOSIS — M25671 Stiffness of right ankle, not elsewhere classified: Secondary | ICD-10-CM | POA: Diagnosis not present

## 2020-03-11 DIAGNOSIS — R2689 Other abnormalities of gait and mobility: Secondary | ICD-10-CM | POA: Diagnosis not present

## 2020-03-11 DIAGNOSIS — I10 Essential (primary) hypertension: Secondary | ICD-10-CM

## 2020-03-11 DIAGNOSIS — M6281 Muscle weakness (generalized): Secondary | ICD-10-CM

## 2020-03-11 DIAGNOSIS — Z0000 Encounter for general adult medical examination without abnormal findings: Secondary | ICD-10-CM

## 2020-03-11 DIAGNOSIS — E559 Vitamin D deficiency, unspecified: Secondary | ICD-10-CM

## 2020-03-11 DIAGNOSIS — Z124 Encounter for screening for malignant neoplasm of cervix: Secondary | ICD-10-CM

## 2020-03-11 DIAGNOSIS — N1831 Chronic kidney disease, stage 3a: Secondary | ICD-10-CM | POA: Diagnosis not present

## 2020-03-11 DIAGNOSIS — Z23 Encounter for immunization: Secondary | ICD-10-CM | POA: Diagnosis not present

## 2020-03-11 MED ORDER — INDAPAMIDE 2.5 MG PO TABS: 2.5000 mg | ORAL_TABLET | Freq: Every day | ORAL | 0 refills | Status: DC

## 2020-03-11 NOTE — Progress Notes (Signed)
Subjective:  Patient ID: Jamie Jenkins, female    DOB: 01/03/1961  Age: 59 y.o. MRN: [id number]  CC: Annual Exam and Hypertension  This visit occurred during the SARS-CoV-2 public health emergency.  Safety protocols were in place, including screening questions prior to the visit, additional usage of staff PPE, and extensive cleaning of exam room while observing appropriate contact time as indicated for disinfecting solutions.    HPI Candle Latterell presents for a CPX.  She complains that her blood pressure has not been well controlled on hydrochlorothiazide.  She denies headache, blurred vision, chest pain, shortness of breath, palpitations, edema, or fatigue.  She tells me she is not taking NSAIDs.  She is slowly recovering from a right ankle fracture.  Outpatient Medications Prior to Visit  Medication Sig Dispense Refill  . aspirin EC 325 MG tablet Take 1 tablet (325 mg total) by mouth daily. 30 tablet 0  . oxyCODONE-acetaminophen (PERCOCET) 5-325 MG tablet Take 1 tablet by mouth every 8 (eight) hours as needed. 30 tablet 0  . hydrochlorothiazide (HYDRODIURIL) 25 MG tablet Take 1 tablet (25 mg total) by mouth daily. 90 tablet 0   No facility-administered medications prior to visit.    ROS Review of Systems  Constitutional: Negative for appetite change, diaphoresis, fatigue and unexpected weight change.  HENT: Negative.   Eyes: Negative.   Respiratory: Negative for cough, chest tightness, shortness of breath and wheezing.   Cardiovascular: Negative for chest pain, palpitations and leg swelling.  Gastrointestinal: Negative for abdominal pain, blood in stool, constipation, diarrhea, nausea and vomiting.  Endocrine: Negative.   Genitourinary: Negative.  Negative for difficulty urinating.  Musculoskeletal: Positive for arthralgias. Negative for back pain and myalgias.  Skin: Negative.   Neurological: Negative.  Negative for dizziness, weakness, light-headedness and numbness.   Hematological: Negative for adenopathy. Does not bruise/bleed easily.  Psychiatric/Behavioral: Negative.     Objective:  BP (!) 142/92   Pulse 62   Temp 98.3 F (36.8 C) (Oral)   Ht 5\' 7"  (1.702 m)   Wt 208 lb (94.3 kg)   LMP 08/23/2012   SpO2 99%   BMI 32.58 kg/m   BP Readings from Last 3 Encounters:  03/11/20 (!) 142/92  11/22/19 108/68  11/16/19 119/64    Wt Readings from Last 3 Encounters:  03/11/20 208 lb (94.3 kg)  11/22/19 215 lb (97.5 kg)  11/16/19 220 lb (99.8 kg)    Physical Exam Vitals reviewed.  Constitutional:      Appearance: Normal appearance.  HENT:     Nose: Nose normal.     Mouth/Throat:     Mouth: Mucous membranes are moist.  Eyes:     General: No scleral icterus.    Conjunctiva/sclera: Conjunctivae normal.  Cardiovascular:     Rate and Rhythm: Normal rate and regular rhythm.     Heart sounds: No murmur heard.   Pulmonary:     Effort: Pulmonary effort is normal.     Breath sounds: No stridor. No wheezing, rhonchi or rales.  Abdominal:     General: Abdomen is flat. Bowel sounds are normal. There is no distension.     Palpations: Abdomen is soft. There is no hepatomegaly, splenomegaly or mass.     Tenderness: There is no abdominal tenderness.  Musculoskeletal:        General: Normal range of motion.     Cervical back: Neck supple.     Right lower leg: No edema.     Left lower leg:  No edema.  Lymphadenopathy:     Cervical: No cervical adenopathy.  Skin:    General: Skin is warm and dry.     Coloration: Skin is not pale.     Findings: No rash.  Neurological:     General: No focal deficit present.     Mental Status: She is alert.  Psychiatric:        Mood and Affect: Mood normal.        Behavior: Behavior normal.     Lab Results  Component Value Date   WBC 7.5 11/22/2019   HGB 14.8 11/22/2019   HCT 44.6 11/22/2019   PLT 251 11/22/2019   GLUCOSE 83 03/11/2020   CHOL 170 03/11/2020   TRIG 144.0 03/11/2020   HDL 64.80  03/11/2020   LDLCALC 76 03/11/2020   ALT 14 10/05/2017   AST 11 10/05/2017   NA 141 03/11/2020   K 4.1 03/11/2020   CL 105 03/11/2020   CREATININE 1.26 (H) 03/11/2020   BUN 12 03/11/2020   CO2 31 03/11/2020   TSH 2.04 02/13/2019   INR 1.01 07/16/2017   HGBA1C 5.5 02/13/2019    No results found.  Assessment & Plan:   Laquista was seen today for annual exam and hypertension.  Diagnoses and all orders for this visit:  Vitamin D deficiency disease -     VITAMIN D 25 Hydroxy (Vit-D Deficiency, Fractures); Future -     VITAMIN D 25 Hydroxy (Vit-D Deficiency, Fractures) -     Cholecalciferol 50 MCG (2000 UT) TABS; Take 1 tablet (2,000 Units total) by mouth daily. -     DG Bone Density; Future  Essential hypertension- Her blood pressure is not adequately well controlled.  She has developed renal insufficiency.  I recommended that she upgrade to a more potent thiazide diuretic. -     Basic metabolic panel; Future -     indapamide (LOZOL) 2.5 MG tablet; Take 1 tablet (2.5 mg total) by mouth daily. -     Basic metabolic panel -     Urinalysis, Routine w reflex microscopic; Future  Encounter for general adult medical examination with abnormal findings- Exam completed, labs reviewed, vaccines reviewed and updated, cancer screenings addressed, patient education material was given. -     Lipid panel; Future -     Lipid panel  Screening for cervical cancer -     Ambulatory referral to Gynecology  Stage 3a chronic kidney disease ([place])- She has an elevated creatinine but her BUN and urinalysis are normal.  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  She will continue to avoid nephrotoxic agents and will try to get better control of her blood pressure. -     Urinalysis, Routine w reflex microscopic; Future  History of healed fragility fracture- I recommended that she get screened for osteoporosis. -     DG Bone Density; Future  Other orders -     Flu Vaccine QUAD 6+ mos PF IM (Fluarix Quad  PF)   I have discontinued Margarita Grizzle A. Emert's hydrochlorothiazide. I am also having her start on indapamide and Cholecalciferol. Additionally, I am having her maintain her aspirin EC and oxyCODONE-acetaminophen.  Meds ordered this encounter  Medications  . indapamide (LOZOL) 2.5 MG tablet    Sig: Take 1 tablet (2.5 mg total) by mouth daily.    Dispense:  90 tablet    Refill:  0  . Cholecalciferol 50 MCG (2000 UT) TABS    Sig: Take 1 tablet (2,000 Units total)  by mouth daily.    Dispense:  90 tablet    Refill:  1     Follow-up: Return in about 3 months (around 06/09/2020).  Sanda Linger, MD

## 2020-03-11 NOTE — Patient Instructions (Signed)

## 2020-03-11 NOTE — Therapy (Signed)
[facility] Physical Therapy [address] [place], [place], [address] Phone: [phone]   Fax:  [phone]  Physical Therapy Treatment  Patient Details  Name: Jamie Jenkins MRN: [id number] Date of Birth: [birth year] Referring Provider (PT): Bevely Palmer Persons, [place]   Encounter Date: 03/11/2020   PT End of Session - 03/11/20 0928    Visit Number 22    Number of Visits 30    Date for PT Re-Evaluation 04/04/20    Authorization Type [employer] Comm PPO    PT Start Time 0845    PT Stop Time 0929    PT Time Calculation (min) 44 min    Equipment Utilized During Treatment --   CAM boot   Activity Tolerance Patient limited by pain;Patient tolerated treatment well    Behavior During Therapy [facility] for tasks assessed/performed           Past Medical History:  Diagnosis Date   Ankle fracture    right   DDD (degenerative disc disease), lumbar    Gall stones    Hypertension     Past Surgical History:  Procedure Laterality Date   ANKLE FUSION Left 07/21/2017   Procedure: SUBTALAR AND TALONAVICULAR FUSION LEFT FOOT;  Surgeon: Newt Minion, MD;  Location: [place];  Service: Orthopedics;  Laterality: Left;   AXILLARY LYMPH NODE DISSECTION Left 11/24/2012   Procedure: incision and drainage axillary abscess;  Surgeon: Adin Hector, MD;  Location: [place];  Service: General;  Laterality: Left;   BREAST SURGERY  2012   breast biopsy   CHOLECYSTECTOMY     ORIF ANKLE FRACTURE Right 11/22/2019   Procedure: OPEN REDUCTION INTERNAL FIXATION (ORIF) RIGHT ANKLE FRACTURE;  Surgeon: Newt Minion, MD;  Location: [place];  Service: Orthopedics;  Laterality: Right;   TUBAL LIGATION      There were no vitals filed for this visit.   Subjective Assessment - 03/11/20 0923    Subjective relays a lot of pain and swelling today about 7/10 in her ankle as she had to do the cooking for the holidays and was on her feet about 7 hours,    Pertinent History 11/22/2019 right ankle  trimalleolar fx w/ORIF, left subtalar & talonavicular fusion 07/21/17, DDD, HTN    How long can you sit comfortably? 20 minutes    How long can you stand comfortably? 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    Pain Onset More than a month ago                             [facility] Adult PT Treatment/Exercise - 03/11/20 0001      Neuro Re-ed    Neuro Re-ed Details  tandem walk 3 trips      Vasopneumatic   Number Minutes Vasopneumatic  10 minutes    Vasopnuematic Location  Ankle    Vasopneumatic Pressure High    Vasopneumatic Temperature  34      Manual Therapy   Manual therapy comments Rt ankle PROM      Ankle Exercises: Standing   Heel Raises Limitations Heel and toe raises with UE support 2 sets of 10    Other Standing Ankle Exercises 4 inch step downs/ups leaving Rt foot on step X 10 reps, one UE support. Performed lateral today instead of fwd due to pain  Ankle Exercises: Aerobic   Nustep L6 X 8 min      Ankle Exercises: Supine   T-Band red X 20 4 way      Ankle Exercises: Stretches   Soleus Stretch 10 seconds   10 reps with slantboard   Gastroc Stretch 3 reps;30 seconds                    PT Short Term Goals - 03/06/20 0906      PT SHORT TERM GOAL #1   Title Patient demonstrates understanding of initial HEP.    Time 1    Period Months    Status Achieved    Target Date 01/26/20      PT SHORT TERM GOAL #2   Title Patient reports right ankle pain </= 5/10 with non-weightbearing exercises.    Baseline 7/10 on avg now    Time 1    Period Months    Status Achieved    Target Date 01/26/20             PT Long Term Goals - 03/06/20 0906      PT LONG TERM GOAL #1   Title Patient demonstrates & verbalizes understanding of ongoing HEP.    Baseline 1222/2021 Pt verbalizes understanding of HEP to date but PT continues to  progress program as appropriate.    Time 2    Period Months    Status On-going      PT LONG TERM GOAL #2   Title FOTO score >/= 60% functional or <40% limited    Baseline 02/20/2020 FOTO  55.2644  improved from 25% at initial evaluation    Time 2    Period Months    Status On-going      PT LONG TERM GOAL #3   Title right ankle range within 10* of left ankle range.    Baseline 02/20/2020 see PT assessment tab for seated ankle DF & PF PROM & AROM  improved from eval but not LTG not met    Time 2    Period Months    Status On-going      PT LONG TERM GOAL #4   Title Patient ambulates >500' and negotiates ramps, curbs & stairs without device independently.    Baseline can not yet do stairs    Time 2    Period Months    Status On-going      PT LONG TERM GOAL #5   Title Right single leg stance >10seconds    Baseline 7 sec at best    Time 2    Period Months    Status On-going      PT LONG TERM GOAL #6   Title right ankle & foot pain </= 2/10 with standing & gait activities    Baseline 02/20/2020  Pt reports pain ranges from 4/10 at best for standing / gait activities    Time 2    Period Months    Status On-going                 Plan - 03/11/20 0929    Clinical Impression Statement She had more pain and swelling in her ankle today from increased standing activity over the weekend so backed down on some of the standing strengthening exercises today. She continues to have a lot of pain and tenderness in the lateral ankle around her incision. Continued with vaso to reduce pain and edema at end of session.    Personal Factors and  Comorbidities Comorbidity 3+    Comorbidities left subtalar & talonavicular fusion 07/21/17, DDD, HTN    Examination-Activity Limitations Lift;Locomotion Level;Squat;Stairs;Sit;Stand    Examination-Participation Musician;Occupation    Stability/Clinical Decision Making Stable/Uncomplicated    Rehab Potential Good    PT  Frequency 2x / week    PT Duration Other (comment)   9 weeks   PT Treatment/Interventions ADLs/Self Care Home Management;Electrical Stimulation;Cryotherapy;DME Instruction;Gait training;Stair training;Contrast Bath;Functional mobility training;Therapeutic activities;Therapeutic exercise;Balance training;Neuromuscular re-education;Patient/family education;Manual techniques;Scar mobilization;Passive range of motion;Dry needling;Vasopneumatic Device;Vestibular;Joint Manipulations    PT Next Visit Plan ankle range, strength, weight bearing tolerance, gait  vasopneumatic    PT Home Exercise Plan Access Code: [email or web address]    Consulted and Agree with Plan of Care Patient           Patient will benefit from skilled therapeutic intervention in order to improve the following deficits and impairments:  Abnormal gait,Decreased balance,Decreased endurance,Decreased mobility,Decreased range of motion,Decreased scar mobility,Decreased strength,Increased edema,Impaired flexibility,Pain  Visit Diagnosis: Stiffness of right ankle, not elsewhere classified  Localized edema  Pain in right ankle and joints of right foot  Other abnormalities of gait and mobility  Muscle weakness (generalized)     Problem List Patient Active Problem List   Diagnosis Date Noted   Trimalleolar fracture of ankle, closed, left, initial encounter    Trimalleolar fracture of ankle, closed, right, initial encounter    Routine general medical examination at a health care facility 02/13/2019   Gastroesophageal reflux disease without esophagitis 02/13/2019   Screening for cervical cancer 02/13/2019   Visit for screening mammogram 02/13/2019   Hyperglycemia 10/05/2017   Vitamin D deficiency disease 10/05/2017   Obesity 06/08/2016   DDD (degenerative disc disease), lumbar 05/21/2014   Essential hypertension 05/21/2014    Silvestre Mesi 03/11/2020, 9:31 AM  [facility] Physical Therapy [address] [place], [place], [address] Phone: [phone]   Fax:  [phone]  Name: Mckensey Berghuis MRN: [id number] Date of Birth: October 17, 1960

## 2020-03-12 DIAGNOSIS — N1831 Chronic kidney disease, stage 3a: Secondary | ICD-10-CM | POA: Insufficient documentation

## 2020-03-12 DIAGNOSIS — Z0001 Encounter for general adult medical examination with abnormal findings: Secondary | ICD-10-CM | POA: Insufficient documentation

## 2020-03-12 LAB — BASIC METABOLIC PANEL
BUN: 12 mg/dL (ref 6–23)
CO2: 31 mEq/L (ref 19–32)
Calcium: 9.2 mg/dL (ref 8.4–10.5)
Chloride: 105 mEq/L (ref 96–112)
Creatinine, Ser: 1.26 mg/dL — ABNORMAL HIGH (ref 0.40–1.20)
GFR: 46.69 mL/min — ABNORMAL LOW (ref >60.00)
Glucose, Bld: 83 mg/dL (ref 70–99)
Potassium: 4.1 mEq/L (ref 3.5–5.1)
Sodium: 141 mEq/L (ref 135–145)

## 2020-03-12 LAB — LIPID PANEL
Cholesterol: 170 mg/dL (ref 0–200)
HDL: 64.8 mg/dL (ref >39.00)
LDL Cholesterol: 76 mg/dL (ref 0–99)
NonHDL: 104.85
Total CHOL/HDL Ratio: 3
Triglycerides: 144 mg/dL (ref 0.0–149.0)
VLDL: 28.8 mg/dL (ref 0.0–40.0)

## 2020-03-12 LAB — VITAMIN D 25 HYDROXY (VIT D DEFICIENCY, FRACTURES): VITD: 20.1 ng/mL — ABNORMAL LOW (ref 30.00–100.00)

## 2020-03-12 MED ORDER — CHOLECALCIFEROL 50 MCG (2000 UT) PO TABS: 1.0000 | ORAL_TABLET | Freq: Every day | ORAL | 1 refills | Status: DC

## 2020-03-13 ENCOUNTER — Other Ambulatory Visit: Payer: Self-pay

## 2020-03-13 ENCOUNTER — Other Ambulatory Visit (INDEPENDENT_AMBULATORY_CARE_PROVIDER_SITE_OTHER): Payer: BC Managed Care – PPO

## 2020-03-13 ENCOUNTER — Telehealth: Payer: Self-pay | Admitting: Internal Medicine

## 2020-03-13 ENCOUNTER — Ambulatory Visit (INDEPENDENT_AMBULATORY_CARE_PROVIDER_SITE_OTHER): Payer: BC Managed Care – PPO | Admitting: Physical Therapy

## 2020-03-13 DIAGNOSIS — N1831 Chronic kidney disease, stage 3a: Secondary | ICD-10-CM | POA: Diagnosis not present

## 2020-03-13 DIAGNOSIS — I10 Essential (primary) hypertension: Secondary | ICD-10-CM

## 2020-03-13 DIAGNOSIS — M6281 Muscle weakness (generalized): Secondary | ICD-10-CM

## 2020-03-13 DIAGNOSIS — R2681 Unsteadiness on feet: Secondary | ICD-10-CM

## 2020-03-13 DIAGNOSIS — M25671 Stiffness of right ankle, not elsewhere classified: Secondary | ICD-10-CM | POA: Diagnosis not present

## 2020-03-13 DIAGNOSIS — M25571 Pain in right ankle and joints of right foot: Secondary | ICD-10-CM

## 2020-03-13 DIAGNOSIS — R2689 Other abnormalities of gait and mobility: Secondary | ICD-10-CM | POA: Diagnosis not present

## 2020-03-13 DIAGNOSIS — R600 Localized edema: Secondary | ICD-10-CM | POA: Diagnosis not present

## 2020-03-13 NOTE — Therapy (Signed)
[facility] Physical Therapy [address] [place], [place], [address] Phone: [phone]   Fax:  [phone]  Physical Therapy Treatment  Patient Details  Name: Jamie Jenkins MRN: [id number] Date of Birth: 1960-10-24 Referring Provider (PT): Bevely Palmer Persons, [place]   Encounter Date: 03/13/2020   PT End of Session - 03/13/20 0836    Visit Number 23    Number of Visits 30    Date for PT Re-Evaluation 04/04/20    Authorization Type [employer] Comm PPO    PT Start Time 0800    PT Stop Time 0845    PT Time Calculation (min) 45 min    Equipment Utilized During Treatment --   CAM boot   Activity Tolerance Patient limited by pain;Patient tolerated treatment well    Behavior During Therapy [facility] for tasks assessed/performed           Past Medical History:  Diagnosis Date   Ankle fracture    right   DDD (degenerative disc disease), lumbar    Gall stones    Hypertension     Past Surgical History:  Procedure Laterality Date   ANKLE FUSION Left 07/21/2017   Procedure: SUBTALAR AND TALONAVICULAR FUSION LEFT FOOT;  Surgeon: Newt Minion, MD;  Location: [place];  Service: Orthopedics;  Laterality: Left;   AXILLARY LYMPH NODE DISSECTION Left 11/24/2012   Procedure: incision and drainage axillary abscess;  Surgeon: Adin Hector, MD;  Location: [place];  Service: General;  Laterality: Left;   BREAST SURGERY  2012   breast biopsy   CHOLECYSTECTOMY     ORIF ANKLE FRACTURE Right 11/22/2019   Procedure: OPEN REDUCTION INTERNAL FIXATION (ORIF) RIGHT ANKLE FRACTURE;  Surgeon: Newt Minion, MD;  Location: [place];  Service: Orthopedics;  Laterality: Right;   TUBAL LIGATION      There were no vitals filed for this visit.   Subjective Assessment - 03/13/20 0827    Subjective relays the pain has come down some since monday in her Rt foot/ankle    Pertinent History 11/22/2019 right ankle trimalleolar fx w/ORIF, left subtalar & talonavicular fusion 07/21/17, DDD, HTN    How  long can you sit comfortably? 20 minutes    How long can you stand comfortably? 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    Pain Onset More than a month ago            [facility] Adult PT Treatment/Exercise - 03/13/20 0001      Neuro Re-ed    Neuro Re-ed Details  tandem walk and latreal stepping over cones 3 trips, SLS with intermit UE support 30 sec X 3      Vasopneumatic   Number Minutes Vasopneumatic  10 minutes    Vasopnuematic Location  Ankle    Vasopneumatic Pressure High    Vasopneumatic Temperature  34      Manual Therapy   Manual therapy comments Rt ankle PROM      Ankle Exercises: Standing   Heel Raises Limitations Heel and toe raises with UE support 2 sets of 10    Other Standing Ankle Exercises 4 inch step downs/ups leaving Rt foot on step X 10 reps, one UE support.      Ankle Exercises: Aerobic   Nustep L6 X 8 min      Ankle Exercises: Supine   T-Band red X 20 4 way   needs  guidance and no resistance for INV     Ankle Exercises: Stretches   Soleus Stretch 10 seconds   10 reps with slantboard   Gastroc Stretch 3 reps;30 seconds                    PT Short Term Goals - 03/06/20 0906      PT SHORT TERM GOAL #1   Title Patient demonstrates understanding of initial HEP.    Time 1    Period Months    Status Achieved    Target Date 01/26/20      PT SHORT TERM GOAL #2   Title Patient reports right ankle pain </= 5/10 with non-weightbearing exercises.    Baseline 7/10 on avg now    Time 1    Period Months    Status Achieved    Target Date 01/26/20             PT Long Term Goals - 03/06/20 0906      PT LONG TERM GOAL #1   Title Patient demonstrates & verbalizes understanding of ongoing HEP.    Baseline 1222/2021 Pt verbalizes understanding of HEP to date but PT continues to progress program as appropriate.    Time 2    Period Months     Status On-going      PT LONG TERM GOAL #2   Title FOTO score >/= 60% functional or <40% limited    Baseline 02/20/2020 FOTO  55.2644  improved from 25% at initial evaluation    Time 2    Period Months    Status On-going      PT LONG TERM GOAL #3   Title right ankle range within 10* of left ankle range.    Baseline 02/20/2020 see PT assessment tab for seated ankle DF & PF PROM & AROM  improved from eval but not LTG not met    Time 2    Period Months    Status On-going      PT LONG TERM GOAL #4   Title Patient ambulates >500' and negotiates ramps, curbs & stairs without device independently.    Baseline can not yet do stairs    Time 2    Period Months    Status On-going      PT LONG TERM GOAL #5   Title Right single leg stance >10seconds    Baseline 7 sec at best    Time 2    Period Months    Status On-going      PT LONG TERM GOAL #6   Title right ankle & foot pain </= 2/10 with standing & gait activities    Baseline 02/20/2020  Pt reports pain ranges from 4/10 at best for standing / gait activities    Time 2    Period Months    Status On-going                 Plan - 03/13/20 5176    Clinical Impression Statement She was able to perform more standing activity today due to overall less pain today and improved activity tolerance. PT will continue to progress this as able. She continues to show great effort with PT    Personal Factors and Comorbidities Comorbidity 3+    Comorbidities left subtalar & talonavicular fusion 07/21/17, DDD, HTN    Examination-Activity Limitations Lift;Locomotion Level;Squat;Stairs;Sit;Stand    Examination-Participation Musician;Occupation    Stability/Clinical Decision Making Stable/Uncomplicated    Rehab Potential Good  PT Frequency 2x / week    PT Duration Other (comment)   9 weeks   PT Treatment/Interventions ADLs/Self Care Home Management;Electrical Stimulation;Cryotherapy;DME Instruction;Gait  training;Stair training;Contrast Bath;Functional mobility training;Therapeutic activities;Therapeutic exercise;Balance training;Neuromuscular re-education;Patient/family education;Manual techniques;Scar mobilization;Passive range of motion;Dry needling;Vasopneumatic Device;Vestibular;Joint Manipulations    PT Next Visit Plan ankle range, strength, weight bearing tolerance, gait  vasopneumatic    PT Home Exercise Plan Access Code: [email or web address]    Consulted and Agree with Plan of Care Patient           Patient will benefit from skilled therapeutic intervention in order to improve the following deficits and impairments:  Abnormal gait,Decreased balance,Decreased endurance,Decreased mobility,Decreased range of motion,Decreased scar mobility,Decreased strength,Increased edema,Impaired flexibility,Pain  Visit Diagnosis: Stiffness of right ankle, not elsewhere classified  Localized edema  Pain in right ankle and joints of right foot  Other abnormalities of gait and mobility  Muscle weakness (generalized)  Unsteadiness on feet     Problem List Patient Active Problem List   Diagnosis Date Noted   Encounter for general adult medical examination with abnormal findings 03/12/2020   Stage 3a chronic kidney disease ([place]) 03/12/2020   Gastroesophageal reflux disease without esophagitis 02/13/2019   Screening for cervical cancer 02/13/2019   Visit for screening mammogram 02/13/2019   Vitamin D deficiency disease 10/05/2017   Obesity 06/08/2016   DDD (degenerative disc disease), lumbar 05/21/2014   Essential hypertension 05/21/2014    Silvestre Mesi 03/13/2020, 8:39 AM  [facility] Physical Therapy [address] [place], [place], [address] Phone: [phone]   Fax:  [phone]  Name: Jamie Jenkins MRN: [id number] Date of Birth: 02/07/1961

## 2020-03-13 NOTE — Telephone Encounter (Signed)
Patient stated at her previous visit she discussed with Dr.Jones about possibly scheduling some test in addition to having some labs done. She has completed her lab work but she would like to know what further test she needs to have done.   I seen in plan for 03/11/20 a Bone Density in the future.   Does she need to go ahead and schedule that prior to her follow in 3 months?

## 2020-03-14 LAB — URINALYSIS, ROUTINE W REFLEX MICROSCOPIC
Bilirubin Urine: NEGATIVE
Hgb urine dipstick: NEGATIVE
Ketones, ur: NEGATIVE
Leukocytes,Ua: NEGATIVE
Nitrite: NEGATIVE
Specific Gravity, Urine: 1.025 (ref 1.000–1.030)
Total Protein, Urine: NEGATIVE
Urine Glucose: NEGATIVE
Urobilinogen, UA: 0.2 (ref 0.0–1.0)
pH: 7.5 (ref 5.0–8.0)

## 2020-03-18 ENCOUNTER — Other Ambulatory Visit: Payer: Self-pay

## 2020-03-18 ENCOUNTER — Ambulatory Visit (INDEPENDENT_AMBULATORY_CARE_PROVIDER_SITE_OTHER): Payer: BC Managed Care – PPO | Admitting: Physical Therapy

## 2020-03-18 ENCOUNTER — Encounter: Payer: Self-pay | Admitting: Physical Therapy

## 2020-03-18 DIAGNOSIS — M25671 Stiffness of right ankle, not elsewhere classified: Secondary | ICD-10-CM

## 2020-03-18 DIAGNOSIS — R600 Localized edema: Secondary | ICD-10-CM

## 2020-03-18 DIAGNOSIS — R2681 Unsteadiness on feet: Secondary | ICD-10-CM

## 2020-03-18 DIAGNOSIS — M25571 Pain in right ankle and joints of right foot: Secondary | ICD-10-CM | POA: Diagnosis not present

## 2020-03-18 DIAGNOSIS — R2689 Other abnormalities of gait and mobility: Secondary | ICD-10-CM | POA: Diagnosis not present

## 2020-03-18 DIAGNOSIS — M6281 Muscle weakness (generalized): Secondary | ICD-10-CM

## 2020-03-18 NOTE — Therapy (Signed)
[place], [place], [address] Phone: [phone]   Fax:  [phone]  Physical Therapy Treatment  Patient Details  Name: Jamie Jenkins MRN: [id number] Date of Birth: 09/19/1960 Referring Provider (PT): Bevely Palmer Persons, [place]   Encounter Date: 03/18/2020   PT End of Session - 03/18/20 0807    Visit Number 24    Number of Visits 30    Date for PT Re-Evaluation 04/04/20    Authorization Type [employer] PPO    PT Start Time 0800    PT Stop Time 2841    PT Time Calculation (min) 55 min    Equipment Utilized During Treatment --   CAM boot   Activity Tolerance Patient limited by pain;Patient tolerated treatment well    Behavior During Therapy [facility] for tasks assessed/performed           Past Medical History:  Diagnosis Date  . Ankle fracture    right  . DDD (degenerative disc disease), lumbar   . Gall stones   . Hypertension     Past Surgical History:  Procedure Laterality Date  . ANKLE FUSION Left 07/21/2017   Procedure: SUBTALAR AND TALONAVICULAR FUSION LEFT FOOT;  Surgeon: Newt Minion, MD;  Location: [place];  Service: Orthopedics;  Laterality: Left;  . AXILLARY LYMPH NODE DISSECTION Left 11/24/2012   Procedure: incision and drainage axillary abscess;  Surgeon: Adin Hector, MD;  Location: [place];  Service: General;  Laterality: Left;  . BREAST SURGERY  2012   breast biopsy  . CHOLECYSTECTOMY    . ORIF ANKLE FRACTURE Right 11/22/2019   Procedure: OPEN REDUCTION INTERNAL FIXATION (ORIF) RIGHT ANKLE FRACTURE;  Surgeon: Newt Minion, MD;  Location: [place];  Service: Orthopedics;  Laterality: Right;  . TUBAL LIGATION      There were no vitals filed for this visit.   Subjective Assessment - 03/18/20 0800    Subjective She got hot (microwave) / cold (freezer) gel socks that she is using them cold at night.  They are helping.    Pertinent History 11/22/2019 right ankle trimalleolar fx w/ORIF, left subtalar &  talonavicular fusion 07/21/17, DDD, HTN    How long can you sit comfortably? 20 minutes    How long can you stand comfortably? 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 Yes    Pain Score 4     Pain Location Ankle    Pain Orientation Right    Pain Descriptors / Indicators Aching;Sore;Throbbing    Pain Onset More than a month ago    Pain Frequency Constant    Aggravating Factors  walking barefoot to bathroom in middle of night    Pain Relieving Factors sitting to rest                             [facility] Adult PT Treatment/Exercise - 03/18/20 0800      Self-Care   Self-Care Heat/Ice Application;Scar Mobilizations    Scar Mobilizations PT demo & verbalized scar mob same direction prox/distal, med/lat & circles CW/CCW. pt verbalized understanding.    Heat/Ice Application ice massage to ankle PT demo & verbal cues on technique & benefits. Pt verbalized understanding.      Neuro Re-ed    Neuro Re-ed Details  tandem walk and latreal stepping  over cones 3 trips, SLS with intermit UE support 30 sec X 3      Vasopneumatic   Number Minutes Vasopneumatic  10 minutes    Vasopnuematic Location  Ankle    Vasopneumatic Pressure High    Vasopneumatic Temperature  34      Manual Therapy   Manual therapy comments Rt ankle PROM      Ankle Exercises: Stretches   Soleus Stretch 2 reps;30 seconds   10 reps with slantboard   Soleus Stretch Limitations standing depressing heel over step    Gastroc Stretch 2 reps;30 seconds    Gastroc Stretch Limitations standing on step with heel depression      Ankle Exercises: Standing   Rocker Board 1 minute   1 min ant /post & 1 min rt/lt, BLE on round board & BUE support on //bars. demo & verbal cues.   Heel Raises Limitations Heel and toe raises with UE support 2 sets of 10    Other Standing Ankle Exercises 4 inch  step downs/ups leaving Rt foot on step X 10 reps, one UE support.      Ankle Exercises: Aerobic   Nustep L6 X 8 min seat 11      Ankle Exercises: Supine   T-Band red X 20 4 way   needs guidance and no resistance for INV,  demo & verbal cues to isolate ankle for eversion                   PT Short Term Goals - 03/06/20 0906      PT SHORT TERM GOAL #1   Title Patient demonstrates understanding of initial HEP.    Time 1    Period Months    Status Achieved    Target Date 01/26/20      PT SHORT TERM GOAL #2   Title Patient reports right ankle pain </= 5/10 with non-weightbearing exercises.    Baseline 7/10 on avg now    Time 1    Period Months    Status Achieved    Target Date 01/26/20             PT Long Term Goals - 03/18/20 0809      PT LONG TERM GOAL #1   Title Patient demonstrates & verbalizes understanding of ongoing HEP.    Baseline 1222/2021 Pt verbalizes understanding of HEP to date but PT continues to progress program as appropriate.    Time 2    Period Months    Status On-going    Target Date 04/05/20      PT LONG TERM GOAL #2   Title FOTO score >/= 60% functional or <40% limited    Baseline 02/20/2020 FOTO  55.2644  improved from 25% at initial evaluation    Time 2    Period Months    Status On-going    Target Date 04/05/20      PT LONG TERM GOAL #3   Title right ankle range within 10* of left ankle range.    Baseline 02/20/2020 see PT assessment tab for seated ankle DF & PF PROM & AROM  improved from eval but not LTG not met    Time 2    Period Months    Status On-going    Target Date 04/05/20      PT LONG TERM GOAL #4   Title Patient ambulates >500' and negotiates ramps, curbs & stairs without device independently.    Baseline can not yet do stairs  Time 2    Period Months    Status On-going    Target Date 04/05/20      PT LONG TERM GOAL #5   Title Right single leg stance >10seconds    Baseline 7 sec at best    Time 2    Period  Months    Status On-going    Target Date 04/05/20      PT LONG TERM GOAL #6   Title right ankle & foot pain </= 2/10 with standing & gait activities    Baseline 02/20/2020  Pt reports pain ranges from 4/10 at best for standing / gait activities    Time 2    Period Months    Status On-going    Target Date 04/05/20                 Plan - 03/18/20 0809    Clinical Impression Statement PT instructed pt in ice massage & scar mobilizations which she appears to have a general understanding.  PT session focused on ankle strengthening & ROM.  Pain continues to limit her activities & exercise intensity.    Personal Factors and Comorbidities Comorbidity 3+    Comorbidities left subtalar & talonavicular fusion 07/21/17, DDD, HTN    Examination-Activity Limitations Lift;Locomotion Level;Squat;Stairs;Sit;Stand    Examination-Participation Musician;Occupation    Stability/Clinical Decision Making Stable/Uncomplicated    Rehab Potential Good    PT Frequency 2x / week    PT Duration Other (comment)   9 weeks   PT Treatment/Interventions ADLs/Self Care Home Management;Electrical Stimulation;Cryotherapy;DME Instruction;Gait training;Stair training;Contrast Bath;Functional mobility training;Therapeutic activities;Therapeutic exercise;Balance training;Neuromuscular re-education;Patient/family education;Manual techniques;Scar mobilization;Passive range of motion;Dry needling;Vasopneumatic Device;Vestibular;Joint Manipulations    PT Next Visit Plan ankle range, strength, weight bearing tolerance, gait  vasopneumatic    PT Home Exercise Plan Access Code: [email or web address]    Consulted and Agree with Plan of Care Patient           Patient will benefit from skilled therapeutic intervention in order to improve the following deficits and impairments:  Abnormal gait,Decreased balance,Decreased endurance,Decreased mobility,Decreased range of motion,Decreased scar mobility,Decreased  strength,Increased edema,Impaired flexibility,Pain  Visit Diagnosis: Localized edema  Stiffness of right ankle, not elsewhere classified  Pain in right ankle and joints of right foot  Other abnormalities of gait and mobility  Muscle weakness (generalized)  Unsteadiness on feet     Problem List Patient Active Problem List   Diagnosis Date Noted  . Encounter for general adult medical examination with abnormal findings 03/12/2020  . Stage 3a chronic kidney disease ([place]) 03/12/2020  . Gastroesophageal reflux disease without esophagitis 02/13/2019  . Screening for cervical cancer 02/13/2019  . Visit for screening mammogram 02/13/2019  . Vitamin D deficiency disease 10/05/2017  . Obesity 06/08/2016  . DDD (degenerative disc disease), lumbar 05/21/2014  . Essential hypertension 05/21/2014    Jamey Reas, PT, DPT 03/18/2020, 9:37 AM  [facility] [address] [place], [place], [address] Phone: [phone]   Fax:  [phone]  Name: Minerva Bluett MRN: [id number] Date of Birth: 1961/01/20

## 2020-03-19 NOTE — Telephone Encounter (Signed)
She needs to avoid things that will damage the kidneys (like nsaids) Please come back in 1-2 months for a recheck of the kidney function

## 2020-03-19 NOTE — Telephone Encounter (Signed)
Called pt, LVM.   

## 2020-03-20 ENCOUNTER — Ambulatory Visit (INDEPENDENT_AMBULATORY_CARE_PROVIDER_SITE_OTHER): Payer: BC Managed Care – PPO | Admitting: Physical Therapy

## 2020-03-20 ENCOUNTER — Encounter: Payer: Self-pay | Admitting: Physical Therapy

## 2020-03-20 ENCOUNTER — Other Ambulatory Visit: Payer: Self-pay

## 2020-03-20 DIAGNOSIS — R2689 Other abnormalities of gait and mobility: Secondary | ICD-10-CM

## 2020-03-20 DIAGNOSIS — R600 Localized edema: Secondary | ICD-10-CM | POA: Diagnosis not present

## 2020-03-20 DIAGNOSIS — R2681 Unsteadiness on feet: Secondary | ICD-10-CM

## 2020-03-20 DIAGNOSIS — M25571 Pain in right ankle and joints of right foot: Secondary | ICD-10-CM | POA: Diagnosis not present

## 2020-03-20 DIAGNOSIS — M25671 Stiffness of right ankle, not elsewhere classified: Secondary | ICD-10-CM

## 2020-03-20 DIAGNOSIS — M6281 Muscle weakness (generalized): Secondary | ICD-10-CM

## 2020-03-20 NOTE — Therapy (Signed)
[place], [place], [address] Phone: [phone]   Fax:  [phone]  Physical Therapy Treatment  Patient Details  Name: Jamie Jenkins MRN: [id number] Date of Birth: 17-Mar-1960 Referring Provider (PT): Bevely Palmer Persons, [place]   Encounter Date: 03/20/2020   PT End of Session - 03/20/20 0803    Visit Number 25    Number of Visits 34   updated based on recert on 60/03/930   Date for PT Re-Evaluation 04/04/20    Authorization Type [employer] Comm PPO    PT Start Time 0800    PT Stop Time 0850    PT Time Calculation (min) 50 min    Equipment Utilized During Treatment --   CAM boot   Activity Tolerance Patient limited by pain;Patient tolerated treatment well    Behavior During Therapy [facility] for tasks assessed/performed           Past Medical History:  Diagnosis Date  . Ankle fracture    right  . DDD (degenerative disc disease), lumbar   . Gall stones   . Hypertension     Past Surgical History:  Procedure Laterality Date  . ANKLE FUSION Left 07/21/2017   Procedure: SUBTALAR AND TALONAVICULAR FUSION LEFT FOOT;  Surgeon: Newt Minion, MD;  Location: [place];  Service: Orthopedics;  Laterality: Left;  . AXILLARY LYMPH NODE DISSECTION Left 11/24/2012   Procedure: incision and drainage axillary abscess;  Surgeon: Adin Hector, MD;  Location: [place];  Service: General;  Laterality: Left;  . BREAST SURGERY  2012   breast biopsy  . CHOLECYSTECTOMY    . ORIF ANKLE FRACTURE Right 11/22/2019   Procedure: OPEN REDUCTION INTERNAL FIXATION (ORIF) RIGHT ANKLE FRACTURE;  Surgeon: Newt Minion, MD;  Location: [place];  Service: Orthopedics;  Laterality: Right;  . TUBAL LIGATION      There were no vitals filed for this visit.   Subjective Assessment - 03/20/20 0800    Subjective No new issues.    Pertinent History 11/22/2019 right ankle trimalleolar fx w/ORIF, left subtalar & talonavicular fusion 07/21/17, DDD, HTN    How long can you sit  comfortably? 20 minutes    How long can you stand comfortably? 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 Yes    Pain Score 3     Pain Location Ankle    Pain Orientation Right    Pain Descriptors / Indicators Tightness    Pain Type Chronic pain    Pain Onset More than a month ago    Pain Frequency Intermittent    Aggravating Factors  standing & walking    Pain Relieving Factors exercise & heat / ice                             [facility] Adult PT Treatment/Exercise - 03/20/20 0800      Ambulation/Gait   Ambulation/Gait Yes    Ambulation/Gait Assistance 5: Supervision   verbal cues   Ambulation/Gait Assistance Details demo, visual with line & verbal on proper step width to decrease abduction.    Assistive device None    Ambulation Surface Level;Indoor      Vasopneumatic   Number Minutes Vasopneumatic  10 minutes    Vasopnuematic Location  Ankle    Vasopneumatic Pressure High  Vasopneumatic Temperature  34      Manual Therapy   Manual therapy comments Rt ankle PROM      Ankle Exercises: Standing   Rocker Board 1 minute   1 min ant /post & 1 min rt/lt, BLE on round board & BUE support on //bars. demo & verbal cues.   Heel Raises Limitations Heel and toe raises with UE support 2 sets of 10    Balance Beam tandem stance on foam beam with intermittent touch for 1 min RLE in front & 1 min RLE in back;  tandem gait fprward & backward  on foam beam with BUE light touch.  cross stance on foam beam EO head turns rt/lt & up/down 10 reps ea.    Other Standing Ankle Exercises RLE on foam beam light BUE support: tapping 3 cones (ant / lat /med across midline) 10 reps.      Ankle Exercises: Aerobic   Nustep L8 X 8 min seat 11      Ankle Exercises: Supine   T-Band 20 reps 4 way   green PF, red DF & inv, no resistance ever.  PT cues tactile to  isolate motion.     Ankle Exercises: Stretches   Soleus Stretch 2 reps;30 seconds   10 reps with slantboard   Soleus Stretch Limitations standing depressing heel over step    Gastroc Stretch 2 reps;30 seconds    Gastroc Stretch Limitations standing on step with heel depression                    PT Short Term Goals - 03/20/20 1107      PT SHORT TERM GOAL #1   Title RLE single leg stance >5 sec.    Status New    Target Date 03/27/20      PT SHORT TERM GOAL #2   Title Pt ambulates on indoor surfaces without assistive device with proper step width with cues only.    Status New    Target Date 04/20/20             PT Long Term Goals - 03/20/20 1106      PT LONG TERM GOAL #1   Title Patient demonstrates & verbalizes understanding of ongoing HEP.    Baseline 1222/2021 Pt verbalizes understanding of HEP to date but PT continues to progress program as appropriate.    Time 2    Period Months    Status On-going    Target Date 04/20/20      PT LONG TERM GOAL #2   Title FOTO score >/= 60% functional or <40% limited    Baseline 02/20/2020 FOTO  55.2644  improved from 25% at initial evaluation    Time 2    Period Months    Status On-going    Target Date 04/20/20      PT LONG TERM GOAL #3   Title right ankle range within 10* of left ankle range.    Baseline 02/20/2020 see PT assessment tab for seated ankle DF & PF PROM & AROM  improved from eval but not LTG not met    Time 2    Period Months    Status On-going    Target Date 04/20/20      PT LONG TERM GOAL #4   Title Patient ambulates >500' and negotiates ramps, curbs & stairs without device independently.    Baseline can not yet do stairs    Time 2    Period Months  Status On-going    Target Date 04/20/20      PT LONG TERM GOAL #5   Title Right single leg stance >10seconds    Baseline 7 sec at best    Time 2    Period Months    Status On-going    Target Date 04/20/20      PT LONG TERM GOAL #6   Title  right ankle & foot pain </= 2/10 with standing & gait activities    Baseline 02/20/2020  Pt reports pain ranges from 4/10 at best for standing / gait activities    Time 2    Period Months    Status On-going    Target Date 04/20/20                 Plan - 03/20/20 0804    Clinical Impression Statement PT session used foam beam to facilitate ankle strategy & stabilization for balance.  PT also instructed in gait without abduction to improve weight bearing properly thru joint.    Personal Factors and Comorbidities Comorbidity 3+    Comorbidities left subtalar & talonavicular fusion 07/21/17, DDD, HTN    Examination-Activity Limitations Lift;Locomotion Level;Squat;Stairs;Sit;Stand    Examination-Participation Musician;Occupation    Stability/Clinical Decision Making Stable/Uncomplicated    Rehab Potential Good    PT Frequency 2x / week    PT Duration Other (comment)   9 weeks   PT Treatment/Interventions ADLs/Self Care Home Management;Electrical Stimulation;Cryotherapy;DME Instruction;Gait training;Stair training;Contrast Bath;Functional mobility training;Therapeutic activities;Therapeutic exercise;Balance training;Neuromuscular re-education;Patient/family education;Manual techniques;Scar mobilization;Passive range of motion;Dry needling;Vasopneumatic Device;Vestibular;Joint Manipulations    PT Next Visit Plan ankle range, strength, weight bearing tolerance, gait  vasopneumatic    PT Home Exercise Plan Access Code: [email or web address]    Consulted and Agree with Plan of Care Patient           Patient will benefit from skilled therapeutic intervention in order to improve the following deficits and impairments:  Abnormal gait,Decreased balance,Decreased endurance,Decreased mobility,Decreased range of motion,Decreased scar mobility,Decreased strength,Increased edema,Impaired flexibility,Pain  Visit Diagnosis: Localized edema  Stiffness of right ankle, not elsewhere  classified  Pain in right ankle and joints of right foot  Other abnormalities of gait and mobility  Muscle weakness (generalized)  Unsteadiness on feet     Problem List Patient Active Problem List   Diagnosis Date Noted  . Encounter for general adult medical examination with abnormal findings 03/12/2020  . Stage 3a chronic kidney disease ([place]) 03/12/2020  . Gastroesophageal reflux disease without esophagitis 02/13/2019  . Screening for cervical cancer 02/13/2019  . Visit for screening mammogram 02/13/2019  . Vitamin D deficiency disease 10/05/2017  . Obesity 06/08/2016  . DDD (degenerative disc disease), lumbar 05/21/2014  . Essential hypertension 05/21/2014    Jamey Reas, PT, DPT 03/20/2020, 11:10 AM  [facility] Therapy [address] [place], [place], [address] Phone: [phone]   Fax:  [phone]  Name: Jamie Jenkins MRN: [id number] Date of Birth: 03-23-1960

## 2020-03-26 ENCOUNTER — Ambulatory Visit (INDEPENDENT_AMBULATORY_CARE_PROVIDER_SITE_OTHER): Payer: BC Managed Care – PPO | Admitting: Physical Therapy

## 2020-03-26 ENCOUNTER — Other Ambulatory Visit: Payer: Self-pay

## 2020-03-26 ENCOUNTER — Encounter: Payer: Self-pay | Admitting: Physical Therapy

## 2020-03-26 DIAGNOSIS — M25571 Pain in right ankle and joints of right foot: Secondary | ICD-10-CM | POA: Diagnosis not present

## 2020-03-26 DIAGNOSIS — R2689 Other abnormalities of gait and mobility: Secondary | ICD-10-CM

## 2020-03-26 DIAGNOSIS — R600 Localized edema: Secondary | ICD-10-CM | POA: Diagnosis not present

## 2020-03-26 DIAGNOSIS — M6281 Muscle weakness (generalized): Secondary | ICD-10-CM

## 2020-03-26 DIAGNOSIS — R2681 Unsteadiness on feet: Secondary | ICD-10-CM

## 2020-03-26 DIAGNOSIS — M25671 Stiffness of right ankle, not elsewhere classified: Secondary | ICD-10-CM | POA: Diagnosis not present

## 2020-03-26 NOTE — Therapy (Signed)
[facility] [place], [place], [address] Phone: [phone]   Fax:  [phone]  Physical Therapy Treatment  Patient Details  Name: Jamie Jenkins MRN: [id number] Date of Birth: 01-08-1961 Referring Provider (PT): Bevely Palmer Persons, [place]   Encounter Date: 03/26/2020   PT End of Session - 03/26/20 0804    Visit Number 26    Number of Visits 34   updated based on recert on 01/0/0712   Date for PT Re-Evaluation 04/04/20    Authorization Type BC[employer] Comm PPO    PT Start Time 0800    Equipment Utilized During Treatment --   CAM boot   Activity Tolerance Patient limited by pain;Patient tolerated treatment well    Behavior During Therapy [facility] for tasks assessed/performed           Past Medical History:  Diagnosis Date  . Ankle fracture    right  . DDD (degenerative disc disease), lumbar   . Gall stones   . Hypertension     Past Surgical History:  Procedure Laterality Date  . ANKLE FUSION Left 07/21/2017   Procedure: SUBTALAR AND TALONAVICULAR FUSION LEFT FOOT;  Surgeon: Newt Minion, MD;  Location: [place];  Service: Orthopedics;  Laterality: Left;  . AXILLARY LYMPH NODE DISSECTION Left 11/24/2012   Procedure: incision and drainage axillary abscess;  Surgeon: Adin Hector, MD;  Location: [place];  Service: General;  Laterality: Left;  . BREAST SURGERY  2012   breast biopsy  . CHOLECYSTECTOMY    . ORIF ANKLE FRACTURE Right 11/22/2019   Procedure: OPEN REDUCTION INTERNAL FIXATION (ORIF) RIGHT ANKLE FRACTURE;  Surgeon: Newt Minion, MD;  Location: [place];  Service: Orthopedics;  Laterality: Right;  . TUBAL LIGATION      There were no vitals filed for this visit.   Subjective Assessment - 03/26/20 0800    Subjective She has difficulty walking to bathroom in middle of night.    Pertinent History 11/22/2019 right ankle trimalleolar fx w/ORIF, left subtalar & talonavicular fusion 07/21/17, DDD, HTN    How long can you sit comfortably?  20 minutes    How long can you stand comfortably? 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 Yes    Pain Score 3     Pain Location Ankle    Pain Orientation Right    Pain Descriptors / Indicators Tightness    Pain Type Chronic pain    Pain Onset More than a month ago    Pain Frequency Intermittent    Aggravating Factors  non-weight bearing for prolonged time then putting weight on it    Pain Relieving Factors meds, ice, gradual warm up to weight bearing.              [facility] PT Assessment - 03/26/20 0800      Assessment   Medical Diagnosis Right Trimalleolar Fracture ORIF      Functional Tests   Functional tests Single leg stance      Single Leg Stance   Comments RLE up to 5 sec with multiple attempts                         [facility] Adult PT Treatment/Exercise - 03/26/20 0800      Ambulation/Gait   Ambulation/Gait Yes    Ambulation/Gait Assistance 5: Supervision  verbal cues   Assistive device None      Vasopneumatic   Number Minutes Vasopneumatic  10 minutes    Vasopnuematic Location  Ankle    Vasopneumatic Pressure High    Vasopneumatic Temperature  34      Manual Therapy   Manual therapy comments Rt ankle PROM      Ankle Exercises: Standing   Rocker Board 1 minute   1 min ant /post & 1 min rt/lt, BLE on round board & BUE support on //bars. demo & verbal cues.   Balance Beam tandem stance on foam beam with intermittent touch for 1 min RLE in front & 1 min RLE in back;  tandem gait fprward & backward  on foam beam with BUE light touch. Sock footed to facilitate increased intrinsic & extrinsic foot muscles - cross stance on foam beam EO head turns rt/lt & up/down 10 reps ea.    Other Standing Ankle Exercises RLE Sock footed to facilitate increased intrinsic & extrinsic foot muscles on foam beam light BUE support: tapping 3  cones (ant / lat /med across midline) 10 reps      Ankle Exercises: Aerobic   Nustep 8 min seat 11 level 6 first 4 minutes then level 8      Ankle Exercises: Supine   T-Band 20 reps 4 way   green PF, red DF, ever & inv, PT cues tactile to isolate motion. Sock footed to facilitate increased intrinsic & extrinsic foot muscles     Ankle Exercises: Stretches   Soleus Stretch 2 reps;30 seconds   10 reps with slantboard   Soleus Stretch Limitations standing slant board 10 sec forward, 10 sec medially, 10 sec laterally    Gastroc Stretch 2 reps;30 seconds    Gastroc Stretch Limitations standing on slant board      Ankle Exercises: Seated   Towel Crunch 5 reps    [place] Pickup Glue stick pick up 15 reps midline, 15 reps ea moving medial for inversion & lateral for eversion                    PT Short Term Goals - 03/26/20 0850      PT SHORT TERM GOAL #1   Title RLE single leg stance >5 sec.    Baseline MET 03/26/2020    Status Achieved    Target Date 03/27/20      PT SHORT TERM GOAL #2   Title Pt ambulates on indoor surfaces without assistive device with proper step width with cues only.    Baseline MET 03/26/2020    Status Achieved    Target Date 04/20/20             PT Long Term Goals - 03/20/20 1106      PT LONG TERM GOAL #1   Title Patient demonstrates & verbalizes understanding of ongoing HEP.    Baseline 1222/2021 Pt verbalizes understanding of HEP to date but PT continues to progress program as appropriate.    Time 2    Period Months    Status On-going    Target Date 04/20/20      PT LONG TERM GOAL #2   Title FOTO score >/= 60% functional or <40% limited    Baseline 02/20/2020 FOTO  55.2644  improved from 25% at initial evaluation    Time 2    Period Months    Status On-going    Target Date 04/20/20      PT LONG TERM  GOAL #3   Title right ankle range within 10* of left ankle range.    Baseline 02/20/2020 see PT assessment tab for seated ankle DF & PF  PROM & AROM  improved from eval but not LTG not met    Time 2    Period Months    Status On-going    Target Date 04/20/20      PT LONG TERM GOAL #4   Title Patient ambulates >500' and negotiates ramps, curbs & stairs without device independently.    Baseline can not yet do stairs    Time 2    Period Months    Status On-going    Target Date 04/20/20      PT LONG TERM GOAL #5   Title Right single leg stance >10seconds    Baseline 7 sec at best    Time 2    Period Months    Status On-going    Target Date 04/20/20      PT LONG TERM GOAL #6   Title right ankle & foot pain </= 2/10 with standing & gait activities    Baseline 02/20/2020  Pt reports pain ranges from 4/10 at best for standing / gait activities    Time 2    Period Months    Status On-going    Target Date 04/20/20                 Plan - 03/26/20 0804    Clinical Impression Statement PT progressed exercises to include sock footed & exercises to facilitate increased intrinsic & extrinsic foot muscles. Patient reports her foot was fatigued at end of session with new exercises.  She achieved both STGs.    Personal Factors and Comorbidities Comorbidity 3+    Comorbidities left subtalar & talonavicular fusion 07/21/17, DDD, HTN    Examination-Activity Limitations Lift;Locomotion Level;Squat;Stairs;Sit;Stand    Examination-Participation Musician;Occupation    Stability/Clinical Decision Making Stable/Uncomplicated    Rehab Potential Good    PT Frequency 2x / week    PT Duration Other (comment)   9 weeks   PT Treatment/Interventions ADLs/Self Care Home Management;Electrical Stimulation;Cryotherapy;DME Instruction;Gait training;Stair training;Contrast Bath;Functional mobility training;Therapeutic activities;Therapeutic exercise;Balance training;Neuromuscular re-education;Patient/family education;Manual techniques;Scar mobilization;Passive range of motion;Dry needling;Vasopneumatic  Device;Vestibular;Joint Manipulations    PT Next Visit Plan work towards LTGs, ankle range, strength, weight bearing tolerance, gait  vasopneumatic    PT Home Exercise Plan Access Code: [email or web address]    Consulted and Agree with Plan of Care Patient           Patient will benefit from skilled therapeutic intervention in order to improve the following deficits and impairments:  Abnormal gait,Decreased balance,Decreased endurance,Decreased mobility,Decreased range of motion,Decreased scar mobility,Decreased strength,Increased edema,Impaired flexibility,Pain  Visit Diagnosis: Localized edema  Stiffness of right ankle, not elsewhere classified  Pain in right ankle and joints of right foot  Other abnormalities of gait and mobility  Muscle weakness (generalized)  Unsteadiness on feet     Problem List Patient Active Problem List   Diagnosis Date Noted  . Encounter for general adult medical examination with abnormal findings 03/12/2020  . Stage 3a chronic kidney disease ([place]) 03/12/2020  . Gastroesophageal reflux disease without esophagitis 02/13/2019  . Screening for cervical cancer 02/13/2019  . Visit for screening mammogram 02/13/2019  . Vitamin D deficiency disease 10/05/2017  . Obesity 06/08/2016  . DDD (degenerative disc disease), lumbar 05/21/2014  . Essential hypertension 05/21/2014    Jamey Reas, PT, DPT 03/26/2020, 8:56 AM  [facility]  [place], [place], [address] Phone: [phone]   Fax:  [phone]  Name: Jamie Jenkins MRN: [id number] Date of Birth: 12/16/1960

## 2020-03-29 ENCOUNTER — Ambulatory Visit (INDEPENDENT_AMBULATORY_CARE_PROVIDER_SITE_OTHER): Payer: BC Managed Care – PPO | Admitting: Physical Therapy

## 2020-03-29 ENCOUNTER — Other Ambulatory Visit: Payer: Self-pay

## 2020-03-29 DIAGNOSIS — M25671 Stiffness of right ankle, not elsewhere classified: Secondary | ICD-10-CM | POA: Diagnosis not present

## 2020-03-29 DIAGNOSIS — M6281 Muscle weakness (generalized): Secondary | ICD-10-CM | POA: Diagnosis not present

## 2020-03-29 DIAGNOSIS — M25571 Pain in right ankle and joints of right foot: Secondary | ICD-10-CM | POA: Diagnosis not present

## 2020-03-29 DIAGNOSIS — R600 Localized edema: Secondary | ICD-10-CM

## 2020-03-29 DIAGNOSIS — R2681 Unsteadiness on feet: Secondary | ICD-10-CM

## 2020-03-29 NOTE — Therapy (Addendum)
[facility] Physical Therapy [address] [place], [place], [address] Phone: [phone]   Fax:  [phone]  Physical Therapy Treatment During this treatment session, this physical therapist was present, participating in and directing the treatment.   This note has been reviewed and this clinician agrees with the information provided.   Elsie Ra, PT, DPT 03/29/20 11:12 AM  Patient Details  Name: Jamie Jenkins MRN: [id number] Date of Birth: [birth year] Referring Provider (PT): Bevely Palmer Persons, [place]   Encounter Date: 03/29/2020   PT End of Session - 03/29/20 0913    Visit Number 27   Number of Visits 34   updated based on recert on 17/08/1605   Date for PT Re-Evaluation 04/04/20    Authorization Type [employer]BS Comm PPO    PT Start Time 0802    PT Stop Time 0856    PT Time Calculation (min) 54 min    Equipment Utilized During Treatment --   CAM boot   Activity Tolerance Patient limited by pain;Patient tolerated treatment well    Behavior During Therapy [facility] for tasks assessed/performed           Past Medical History:  Diagnosis Date  . Ankle fracture    right  . DDD (degenerative disc disease), lumbar   . Gall stones   . Hypertension     Past Surgical History:  Procedure Laterality Date  . ANKLE FUSION Left 07/21/2017   Procedure: SUBTALAR AND TALONAVICULAR FUSION LEFT FOOT;  Surgeon: Newt Minion, MD;  Location: [place];  Service: Orthopedics;  Laterality: Left;  . AXILLARY LYMPH NODE DISSECTION Left 11/24/2012   Procedure: incision and drainage axillary abscess;  Surgeon: Adin Hector, MD;  Location: [place];  Service: General;  Laterality: Left;  . BREAST SURGERY  2012   breast biopsy  . CHOLECYSTECTOMY    . ORIF ANKLE FRACTURE Right 11/22/2019   Procedure: OPEN REDUCTION INTERNAL FIXATION (ORIF) RIGHT ANKLE FRACTURE;  Surgeon: Newt Minion, MD;  Location: [place];  Service: Orthopedics;  Laterality: Right;  . TUBAL LIGATION      There were no  vitals filed for this visit.   Subjective Assessment - 03/29/20 0759    Subjective patient reports pain is 3/10 along scar and across the front of the foot. Pain was worse yesterday but cannot think of anything that aggravated it.    Pertinent History 11/22/2019 right ankle trimalleolar fx w/ORIF, left subtalar & talonavicular fusion 07/21/17, DDD, HTN    How long can you sit comfortably? 20 minutes    How long can you stand comfortably? 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 Yes    Pain Score 3     Pain Location Ankle    Pain Orientation Right    Pain Onset More than a month ago                             [facility] Adult PT Treatment/Exercise - 03/29/20 0001      Vasopneumatic   Number Minutes Vasopneumatic  10 minutes    Vasopnuematic Location  Ankle    Vasopneumatic Pressure High    Vasopneumatic Temperature  34      Manual Therapy   Manual Therapy Soft tissue mobilization    Soft tissue mobilization 10 mins to lateral anke scar  with biofreeze gel      Ankle Exercises: Aerobic   Nustep 8 min seat 11 level 4 first 4 minutes then level 6      Ankle Exercises: Stretches   Soleus Stretch 2 reps;30 seconds    Gastroc Stretch 2 reps;30 seconds      Ankle Exercises: Standing   Rocker Board 2 minutes    Heel Raises Limitations Heel and toe raises with UE support 2 sets of 10   on airex   Balance Beam tandem stance on foam 87mns      Ankle Exercises: Seated   Towel Crunch 5 reps;Other (comment)   x4 crunch and squeeze hold     Ankle Exercises: Supine   T-Band 20 reps 4 way   green t band                   PT Short Term Goals - 03/26/20 0850      PT SHORT TERM GOAL #1   Title RLE single leg stance >5 sec.    Baseline MET 03/26/2020    Status Achieved    Target Date 03/27/20      PT SHORT TERM GOAL #2   Title Pt  ambulates on indoor surfaces without assistive device with proper step width with cues only.    Baseline MET 03/26/2020    Status Achieved    Target Date 04/20/20             PT Long Term Goals - 03/20/20 1106      PT LONG TERM GOAL #1   Title Patient demonstrates & verbalizes understanding of ongoing HEP.    Baseline 1222/2021 Pt verbalizes understanding of HEP to date but PT continues to progress program as appropriate.    Time 2    Period Months    Status On-going    Target Date 04/20/20      PT LONG TERM GOAL #2   Title FOTO score >/= 60% functional or <40% limited    Baseline 02/20/2020 FOTO  55.2644  improved from 25% at initial evaluation    Time 2    Period Months    Status On-going    Target Date 04/20/20      PT LONG TERM GOAL #3   Title right ankle range within 10* of left ankle range.    Baseline 02/20/2020 see PT assessment tab for seated ankle DF & PF PROM & AROM  improved from eval but not LTG not met    Time 2    Period Months    Status On-going    Target Date 04/20/20      PT LONG TERM GOAL #4   Title Patient ambulates >500' and negotiates ramps, curbs & stairs without device independently.    Baseline can not yet do stairs    Time 2    Period Months    Status On-going    Target Date 04/20/20      PT LONG TERM GOAL #5   Title Right single leg stance >10seconds    Baseline 7 sec at best    Time 2    Period Months    Status On-going    Target Date 04/20/20      PT LONG TERM GOAL #6   Title right ankle & foot pain </= 2/10 with standing & gait activities    Baseline 02/20/2020  Pt reports pain ranges from 4/10 at best for standing / gait activities    Time 2  Period Months    Status On-going    Target Date 04/20/20                 Plan - 03/29/20 0853    Clinical Impression Statement Pt. tolerated treatment well today and noted her pain decreased a little bit after biofreeze massage over scar and subsequent icing. Pt. will benefit from  tissue massage and progressing her ROM to decrease stiffness and restore function.    Personal Factors and Comorbidities Comorbidity 3+    Comorbidities left subtalar & talonavicular fusion 07/21/17, DDD, HTN    Examination-Activity Limitations Lift;Locomotion Level;Squat;Stairs;Sit;Stand    Examination-Participation Musician;Occupation    Stability/Clinical Decision Making Stable/Uncomplicated    Rehab Potential Good    PT Frequency 2x / week    PT Duration Other (comment)   9 weeks   PT Treatment/Interventions ADLs/Self Care Home Management;Electrical Stimulation;Cryotherapy;DME Instruction;Gait training;Stair training;Contrast Bath;Functional mobility training;Therapeutic activities;Therapeutic exercise;Balance training;Neuromuscular re-education;Patient/family education;Manual techniques;Scar mobilization;Passive range of motion;Dry needling;Vasopneumatic Device;Vestibular;Joint Manipulations    PT Next Visit Plan work towards LTGs, ankle range, weight bearing tolerance and balance, gait  vasopneumatic    PT Home Exercise Plan Access Code: [email or web address]    [email or web address] and Agree with Plan of Care Patient           Patient will benefit from skilled therapeutic intervention in order to improve the following deficits and impairments:  Abnormal gait,Decreased balance,Decreased endurance,Decreased mobility,Decreased range of motion,Decreased scar mobility,Decreased strength,Increased edema,Impaired flexibility,Pain  Visit Diagnosis: Localized edema  Stiffness of right ankle, not elsewhere classified  Pain in right ankle and joints of right foot  Muscle weakness (generalized)  Unsteadiness on feet     Problem List Patient Active Problem List   Diagnosis Date Noted  . Encounter for general adult medical examination with abnormal findings 03/12/2020  . Stage 3a chronic kidney disease ([place]) 03/12/2020  . Gastroesophageal reflux disease without esophagitis  02/13/2019  . Screening for cervical cancer 02/13/2019  . Visit for screening mammogram 02/13/2019  . Vitamin D deficiency disease 10/05/2017  . Obesity 06/08/2016  . DDD (degenerative disc disease), lumbar 05/21/2014  . Essential hypertension 05/21/2014    Glenetta Hew, SPT 03/29/2020, 9:16 AM  [facility] Physical Therapy [address] [place], [place], [address] Phone: [phone]   Fax:  [phone]  Name: Jamie Jenkins MRN: [id number] Date of Birth: 11-Jun-1960

## 2020-04-02 ENCOUNTER — Other Ambulatory Visit: Payer: Self-pay

## 2020-04-02 ENCOUNTER — Other Ambulatory Visit (HOSPITAL_COMMUNITY)
Admission: RE | Admit: 2020-04-02 | Discharge: 2020-04-02 | Disposition: A | Discharge status: Home / Self Care | Payer: BC Managed Care – PPO | Source: Ambulatory Visit | Attending: Nurse Practitioner | Admitting: Nurse Practitioner

## 2020-04-02 ENCOUNTER — Encounter: Payer: Self-pay | Admitting: Nurse Practitioner

## 2020-04-02 ENCOUNTER — Ambulatory Visit: Payer: BC Managed Care – PPO | Admitting: Nurse Practitioner

## 2020-04-02 ENCOUNTER — Encounter: Payer: BC Managed Care – PPO | Admitting: Physical Therapy

## 2020-04-02 VITALS — BP 118/80 | HR 72 | Resp 16 | Ht 66.0 in | Wt 205.0 lb

## 2020-04-02 DIAGNOSIS — Z01419 Encounter for gynecological examination (general) (routine) without abnormal findings: Secondary | ICD-10-CM | POA: Insufficient documentation

## 2020-04-02 LAB — HM PAP SMEAR

## 2020-04-02 NOTE — Patient Instructions (Signed)

## 2020-04-02 NOTE — Progress Notes (Signed)
60 y.o. G3P3 Married [place] American female here for annual exam.    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    Does not remember her last pap smear. Denies any GYN issue, no vaginal bleeding  Patient's last menstrual period was 08/23/2012.          Sexually active: No.  The current method of family planning is post menopausal status.    Exercising: No.  exercise Smoker:  no  Health Maintenance: Pap:  Several years History of abnormal Pap:  no MMG:  04-11-2019 category a density birads 1:neg Colonoscopy:  cologard neg per patient maybe done 57yrs ago BMD:   none TDaP:  2017 Gardasil:   n/a Covid-19: pfizer Hep C testing: 2017 neg Screening Labs: with PCP   reports that she has never smoked. She has never used smokeless tobacco. She reports previous alcohol use. She reports that she does not use drugs.  Past Medical History:  Diagnosis Date  . Ankle fracture    right  . DDD (degenerative disc disease), lumbar   . Gall stones   . Hypertension     Past Surgical History:  Procedure Laterality Date  . ANKLE FUSION Left 07/21/2017   Procedure: SUBTALAR AND TALONAVICULAR FUSION LEFT FOOT;  Surgeon: Newt Minion, MD;  Location: [place];  Service: Orthopedics;  Laterality: Left;  . AXILLARY LYMPH NODE DISSECTION Left 11/24/2012   Procedure: incision and drainage axillary abscess;  Surgeon: Adin Hector, MD;  Location: [place];  Service: General;  Laterality: Left;  . BREAST SURGERY  2012   breast biopsy  . CHOLECYSTECTOMY    . ORIF ANKLE FRACTURE Right 11/22/2019   Procedure: OPEN REDUCTION INTERNAL FIXATION (ORIF) RIGHT ANKLE FRACTURE;  Surgeon: Newt Minion, MD;  Location: [place];  Service: Orthopedics;  Laterality: Right;  . TUBAL LIGATION      Current Outpatient Medications  Medication Sig Dispense Refill  . Cholecalciferol 50 MCG (2000 UT) TABS Take 1 tablet (2,000 Units total) by mouth  daily. 90 tablet 1  . indapamide (LOZOL) 2.5 MG tablet Take 1 tablet (2.5 mg total) by mouth daily. 90 tablet 0  . oxyCODONE-acetaminophen (PERCOCET) 5-325 MG tablet Take 1 tablet by mouth every 8 (eight) hours as needed. 30 tablet 0   No current facility-administered medications for this visit.    Family History  Problem Relation Age of Onset  . Pancreatic cancer Mother        Deceased  . Hypertension Father   . Other Father        stomach aneurysm  . Deep vein thrombosis Brother        Deceased    Review of Systems  Constitutional: Negative.   HENT: Negative.   Eyes: Negative.   Respiratory: Negative.   Cardiovascular: Negative.   Gastrointestinal: Negative.   Endocrine: Negative.   Genitourinary: Negative for difficulty urinating, pelvic pain, vaginal bleeding and vaginal discharge.  Musculoskeletal: Negative.   Skin: Negative.   Allergic/Immunologic: Negative.   Neurological: Negative.   Hematological: Negative.   Psychiatric/Behavioral: Negative.     Exam:   BP 118/80   Pulse 72   Resp 16   Ht 5\' 6"  (1.676 m)   Wt 205 lb (93 kg)   LMP 08/23/2012   BMI 33.09 kg/m   Height: 5\' 6"  (167.6 cm)  General appearance: alert, cooperative and appears stated age, no acute distress Head:  Normocephalic, without obvious abnormality Neck: no adenopathy, thyroid normal to inspection and palpation Lungs: clear to auscultation bilaterally Breasts: normal appearance, no masses or tenderness, No axillary or supraclavicular adenopathy, Normal to palpation without dominant masses Heart: regular rate and rhythm Abdomen: soft, non-tender; no masses,  no organomegaly Extremities: extremities normal, no edema Skin: No rashes or lesions Lymph nodes: Cervical, supraclavicular, and axillary nodes normal. No abnormal inguinal nodes palpated Neurologic: Grossly normal   Pelvic: External genitalia:  no lesions              Urethra:  normal appearing urethra with no masses, tenderness  or lesions              Bartholins and Skenes: normal                 Vagina: normal appearing vagina, appropriate for age, normal appearing discharge, no lesions              Cervix: neg cervical motion tenderness, no visible lesions             Bimanual Exam:   Uterus:  normal size, contour, position, consistency, mobility, non-tender              Adnexa: no mass, fullness, tenderness                 Joy, CMA Chaperone was present for exam.  A:  Well Woman with normal exam  P:   Pap :collected today  Mammogram: discussed, to be scheduled by patient (due 04/11/2020)  Labs: done with PCP  Medications: reviewed, no new medications today  Cologuard due in 1 year   DEXA scan scheduled by PCP (considering Hx fracture)  Encouraged daily exercise  Encouraged Calcium/vit D

## 2020-04-03 ENCOUNTER — Other Ambulatory Visit: Payer: Self-pay | Admitting: Nurse Practitioner

## 2020-04-03 DIAGNOSIS — A5901 Trichomonal vulvovaginitis: Secondary | ICD-10-CM

## 2020-04-03 DIAGNOSIS — B379 Candidiasis, unspecified: Secondary | ICD-10-CM

## 2020-04-03 LAB — CYTOLOGY - PAP
Comment: NEGATIVE
Diagnosis: NEGATIVE
High risk HPV: NEGATIVE

## 2020-04-03 MED ORDER — METRONIDAZOLE 500 MG PO TABS: 500.0000 mg | ORAL_TABLET | Freq: Two times a day (BID) | ORAL | 0 refills | Status: DC

## 2020-04-03 MED ORDER — FLUCONAZOLE 150 MG PO TABS: 150.0000 mg | ORAL_TABLET | Freq: Once | ORAL | 0 refills | Status: AC

## 2020-04-03 NOTE — Progress Notes (Signed)
Please call and notify patient that her pap smear came back showing that her cells looked normal (no evidence of cancer or precancer cells) but there were other issues identified on the pap smear that need to be addressed.  1. Trichomonas- this is a sexually transmitted organism that can sometimes cause itching, odor, bleeding and discharge. Luckily she is not symptomatic, but I recommend treating her and her husband. There is no way to know how long she has had this infection. A prescription has been sent to her pharmacy for her. It is an antibiotic. She will not be able to drink any alcohol while on this antibiotic. Her husband should call his doctor or local health department for a prescription. Please make sure to wait 1 week after both she and her husband are treated to have sex. (Females and males have slightly different treatment recommendations for treatment, Women are treated with metronidazole 500mg  BID x 7 days and men can be treated with metronidazole 2 gm as a one time dose.)  2. Yeast- Again, since she is asymptomatic, treatment is not always necessary, but in this case, since she will need an antibiotic for the above infection, she may become symptomatic with yeast. I will sent in a prescription.  3. Please make an appointment for 3 months to repeat a test to make sure Trichomonas has been adequately treated.

## 2020-04-05 ENCOUNTER — Encounter: Payer: BC Managed Care – PPO | Admitting: Physical Therapy

## 2020-04-09 ENCOUNTER — Encounter: Payer: Self-pay | Admitting: Physical Therapy

## 2020-04-09 ENCOUNTER — Ambulatory Visit (INDEPENDENT_AMBULATORY_CARE_PROVIDER_SITE_OTHER): Payer: BC Managed Care – PPO | Admitting: Physical Therapy

## 2020-04-09 ENCOUNTER — Other Ambulatory Visit: Payer: Self-pay

## 2020-04-09 DIAGNOSIS — M25671 Stiffness of right ankle, not elsewhere classified: Secondary | ICD-10-CM

## 2020-04-09 DIAGNOSIS — R600 Localized edema: Secondary | ICD-10-CM | POA: Diagnosis not present

## 2020-04-09 DIAGNOSIS — M25571 Pain in right ankle and joints of right foot: Secondary | ICD-10-CM | POA: Diagnosis not present

## 2020-04-09 DIAGNOSIS — R2681 Unsteadiness on feet: Secondary | ICD-10-CM

## 2020-04-09 DIAGNOSIS — R2689 Other abnormalities of gait and mobility: Secondary | ICD-10-CM

## 2020-04-09 DIAGNOSIS — M6281 Muscle weakness (generalized): Secondary | ICD-10-CM

## 2020-04-09 NOTE — Therapy (Signed)
[place], [place], [address] Phone: [phone]   Fax:  [phone]  Physical Therapy Treatment  Patient Details  Name: Jamie Jenkins MRN: [id number] Date of Birth: [birth year] Referring Provider (PT): Bevely Palmer Persons, [place]   Encounter Date: 04/09/2020   PT End of Session - 04/09/20 0805    Visit Number 28    Number of Visits 34   updated based on recert on 81/04/7515   Date for PT Re-Evaluation 04/04/20    Authorization Type [employer] Comm PPO    PT Start Time 0800    PT Stop Time 0017    PT Time Calculation (min) 55 min    Equipment Utilized During Treatment --   CAM boot   Activity Tolerance Patient limited by pain;Patient tolerated treatment well    Behavior During Therapy [facility] for tasks assessed/performed           Past Medical History:  Diagnosis Date  . Ankle fracture    right  . DDD (degenerative disc disease), lumbar   . Gall stones   . Hypertension     Past Surgical History:  Procedure Laterality Date  . ANKLE FUSION Left 07/21/2017   Procedure: SUBTALAR AND TALONAVICULAR FUSION LEFT FOOT;  Surgeon: Newt Minion, MD;  Location: [place];  Service: Orthopedics;  Laterality: Left;  . AXILLARY LYMPH NODE DISSECTION Left 11/24/2012   Procedure: incision and drainage axillary abscess;  Surgeon: Adin Hector, MD;  Location: [place];  Service: General;  Laterality: Left;  . BREAST SURGERY  2012   breast biopsy  . CHOLECYSTECTOMY    . ORIF ANKLE FRACTURE Right 11/22/2019   Procedure: OPEN REDUCTION INTERNAL FIXATION (ORIF) RIGHT ANKLE FRACTURE;  Surgeon: Newt Minion, MD;  Location: [place];  Service: Orthopedics;  Laterality: Right;  . TUBAL LIGATION      There were no vitals filed for this visit.   Subjective Assessment - 04/09/20 0800    Subjective The scar area bothers her in morning until it warms up and then in evening when she has been on her feet.    Pertinent History 11/22/2019 right ankle  trimalleolar fx w/ORIF, left subtalar & talonavicular fusion 07/21/17, DDD, HTN    How long can you sit comfortably? 20 minutes    How long can you stand comfortably? 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 Yes    Pain Score 5    in last week, worst 6-7/10 & best 2/10   Pain Location Ankle    Pain Orientation Right;Lateral    Pain Descriptors / Indicators Sharp;Shooting    Pain Type Chronic pain    Pain Onset More than a month ago    Pain Frequency Intermittent    Aggravating Factors  stiffness & being on feet too much,    Pain Relieving Factors ice & elevation              [facility] PT Assessment - 04/09/20 0800      Assessment   Medical Diagnosis Right Trimalleolar Fracture ORIF    Referring Provider (PT) Bevely Palmer Persons, PA      Observation/Other Assessments   Skin Integrity lateral right ankle scar has pin size white head area on scar which is also same area that is tender. No redness or warmth noted. Pt reports that she has not  had Xray of ankle since Nov. PT recommended setting up appt with MD to assess pain & ankle.                         [facility] Adult PT Treatment/Exercise - 04/09/20 0800      Vasopneumatic   Number Minutes Vasopneumatic  10 minutes    Vasopnuematic Location  Ankle    Vasopneumatic Pressure High    Vasopneumatic Temperature  34      Manual Therapy   Manual Therapy --    Soft tissue mobilization --      Ankle Exercises: Standing   Rocker Board 1 minute   round board 1 min ant/mid/post & 1 min right/mid/left BUE support cues to minimize use   Heel Raises Limitations Heel and toe raises without UE support 2 sets of 10   on airex   Balance Beam tandem stance on foam 1 min RLE in front & 1 min RLE in back    Other Standing Ankle Exercises RLE push-off & controlled eccentric PF with LLE on 6" step in //bars  with intermittent touch 10 reps 2 sets    Other Standing Ankle Exercises RLE without shoe on foam beam with LLE tapping cone medial & lateral to facilitate pronation / supination control 10 reps 2 sets  BUE support with cues to minimize lift      Ankle Exercises: Aerobic   Nustep 8 min seat 11 level 5 first 4 minutes then level 7      Ankle Exercises: Seated   Towel Crunch 5 reps;Other (comment)   x4 crunch and squeeze hold   Other Seated Ankle Exercises Green theraband 20 reps ea - PF, PF w/inversion, PF w/eversion, DF, DF w/inversion & DF w/eversion.  pt requires tactile cues for initial 2-3 reps of ea w/inversion or eversion      Ankle Exercises: Supine   T-Band --      Ankle Exercises: Stretches   Soleus Stretch 2 reps;20 seconds    Soleus Stretch Limitations standing on step with heel depression    Gastroc Stretch 2 reps;20 seconds    Gastroc Stretch Limitations standing on step with heel depression                    PT Short Term Goals - 03/26/20 0850      PT SHORT TERM GOAL #1   Title RLE single leg stance >5 sec.    Baseline MET 03/26/2020    Status Achieved    Target Date 03/27/20      PT SHORT TERM GOAL #2   Title Pt ambulates on indoor surfaces without assistive device with proper step width with cues only.    Baseline MET 03/26/2020    Status Achieved    Target Date 04/20/20             PT Long Term Goals - 03/20/20 1106      PT LONG TERM GOAL #1   Title Patient demonstrates & verbalizes understanding of ongoing HEP.    Baseline 1222/2021 Pt verbalizes understanding of HEP to date but PT continues to progress program as appropriate.    Time 2    Period Months    Status On-going    Target Date 04/20/20      PT LONG TERM GOAL #2   Title FOTO score >/= 60% functional or <40% limited    Baseline 02/20/2020 FOTO  55.2644  improved from 25%  at initial evaluation    Time 2    Period Months    Status On-going    Target Date 04/20/20      PT LONG  TERM GOAL #3   Title right ankle range within 10* of left ankle range.    Baseline 02/20/2020 see PT assessment tab for seated ankle DF & PF PROM & AROM  improved from eval but not LTG not met    Time 2    Period Months    Status On-going    Target Date 04/20/20      PT LONG TERM GOAL #4   Title Patient ambulates >500' and negotiates ramps, curbs & stairs without device independently.    Baseline can not yet do stairs    Time 2    Period Months    Status On-going    Target Date 04/20/20      PT LONG TERM GOAL #5   Title Right single leg stance >10seconds    Baseline 7 sec at best    Time 2    Period Months    Status On-going    Target Date 04/20/20      PT LONG TERM GOAL #6   Title right ankle & foot pain </= 2/10 with standing & gait activities    Baseline 02/20/2020  Pt reports pain ranges from 4/10 at best for standing / gait activities    Time 2    Period Months    Status On-going    Target Date 04/20/20                 Plan - 04/09/20 0805    Clinical Impression Statement Patient's right ankle continues to have pain including tenderness to touch that has lasted longer than typical. She has small area of white head like on scar but no other signs of infection.  Patient would benefit from assessment by surgeon or his PA to assess pain & area.  PT session progressed exercises to include stabilization including medial & lateral.    Personal Factors and Comorbidities Comorbidity 3+    Comorbidities left subtalar & talonavicular fusion 07/21/17, DDD, HTN    Examination-Activity Limitations Lift;Locomotion Level;Squat;Stairs;Sit;Stand    Examination-Participation Musician;Occupation    Stability/Clinical Decision Making Stable/Uncomplicated    Rehab Potential Good    PT Frequency 2x / week    PT Duration Other (comment)   9 weeks   PT Treatment/Interventions ADLs/Self Care Home Management;Electrical Stimulation;Cryotherapy;DME  Instruction;Gait training;Stair training;Contrast Bath;Functional mobility training;Therapeutic activities;Therapeutic exercise;Balance training;Neuromuscular re-education;Patient/family education;Manual techniques;Scar mobilization;Passive range of motion;Dry needling;Vasopneumatic Device;Vestibular;Joint Manipulations    PT Next Visit Plan work towards [place], ankle range, weight bearing tolerance and balance, gait  vasopneumatic    PT Home Exercise Plan Access Code: [email or web address]    [email or web address] and Agree with Plan of Care Patient           Patient will benefit from skilled therapeutic intervention in order to improve the following deficits and impairments:  Abnormal gait,Decreased balance,Decreased endurance,Decreased mobility,Decreased range of motion,Decreased scar mobility,Decreased strength,Increased edema,Impaired flexibility,Pain  Visit Diagnosis: Localized edema  Stiffness of right ankle, not elsewhere classified  Pain in right ankle and joints of right foot  Muscle weakness (generalized)  Unsteadiness on feet  Other abnormalities of gait and mobility     Problem List Patient Active Problem List   Diagnosis Date Noted  . Encounter for general adult medical examination with abnormal findings 03/12/2020  . Stage 3a chronic kidney disease ([place]) 03/12/2020  .  Gastroesophageal reflux disease without esophagitis 02/13/2019  . Screening for cervical cancer 02/13/2019  . Visit for screening mammogram 02/13/2019  . Vitamin D deficiency disease 10/05/2017  . Obesity 06/08/2016  . DDD (degenerative disc disease), lumbar 05/21/2014  . Essential hypertension 05/21/2014    Jamey Reas, PT, DPT 04/09/2020, 10:50 AM  [facility] Physical Therapy [address] [place], [place], [address] Phone: [phone]   Fax:  [phone]  Name: Jamie Jenkins MRN: [id number] Date of Birth: [birth year]

## 2020-04-11 ENCOUNTER — Encounter: Payer: Self-pay | Admitting: Physician Assistant

## 2020-04-11 ENCOUNTER — Ambulatory Visit (INDEPENDENT_AMBULATORY_CARE_PROVIDER_SITE_OTHER): Payer: BC Managed Care – PPO | Admitting: Physician Assistant

## 2020-04-11 ENCOUNTER — Ambulatory Visit (INDEPENDENT_AMBULATORY_CARE_PROVIDER_SITE_OTHER): Payer: No Typology Code available for payment source

## 2020-04-11 DIAGNOSIS — Z8781 Personal history of (healed) traumatic fracture: Secondary | ICD-10-CM | POA: Diagnosis not present

## 2020-04-11 DIAGNOSIS — Z9889 Other specified postprocedural states: Secondary | ICD-10-CM | POA: Diagnosis not present

## 2020-04-11 NOTE — Progress Notes (Signed)
Office Visit Note   Patient: Jamie Jenkins           Date of Birth: 12-12-1960           MRN: [id number] Visit Date: 04/11/2020              Requested by: Janith Lima, MD [address] [place],  [place] [address] PCP: Janith Lima, MD  No chief complaint on file.     HPI: Patient is a pleasant 60 year old woman who is 4 months status post ORIF bimalleolar ankle fracture.  She has been struggling with significant sensitivity and swelling and has been working with physical therapy.  The only place she complains of continued pain is over the lateral side and 1 very specific area of the incision over the distal fibula.  Assessment & Plan: Visit Diagnoses:  1. S/P ORIF (open reduction internal fixation) fracture     Plan: Continue with desensitization.  Continue lower extremity strengthening.  Discussed with her long-term if she had continued pain we could consider removing the hardware but I don't think at this time that would be appropriate  Follow-Up Instructions: No follow-ups on file.   Ortho Exam  Patient is alert, oriented, no adenopathy, well-dressed, normal affect, normal respiratory effort.  Right ankle she has strong dorsalis pedis pulse well-healed surgical incision mild soft tissue swelling.  She is focally tender over the distal fibula seems to coordinate where the end of the plate is.  Ankle range of motion is fairly painless she does tolerate me touching the rest of her foot no evidence of infection  Imaging: XR Ankle Complete Right  Result Date: 04/11/2020 3 views of her ankle today demonstrate well-maintained alignment hardware is intact in place overall good healing of the fracture  No images are attached to the encounter.  Labs: Lab Results  Component Value Date   HGBA1C 5.5 02/13/2019   HGBA1C 5.6 10/05/2017   HGBA1C 5.5 06/23/2016     Lab Results  Component Value Date   ALBUMIN 3.7 10/05/2017   ALBUMIN 3.9 07/16/2017   ALBUMIN 4.3  06/23/2016    No results found for: MG Lab Results  Component Value Date   VD25OH 20.10 (L) 03/11/2020   VD25OH 20.39 (L) 02/13/2019   VD25OH 7.43 (L) 10/05/2017    No results found for: PREALBUMIN CBC EXTENDED Latest Ref Rng & Units 11/22/2019 02/13/2019 10/05/2017  WBC 4.0 - 10.5 K/uL 7.5 6.6 5.7  RBC 3.87 - 5.11 MIL/uL 4.38 4.49 3.94  HGB 12.0 - 15.0 g/dL 14.8 14.6 13.3  HCT 36.0 - 46.0 % 44.6 43.2 39.6  PLT 150 - 400 K/uL 251 249.0 238.0  NEUTROABS 1.4 - 7.7 K/uL - 3.6 2.9  LYMPHSABS 0.7 - 4.0 K/uL - 2.5 2.4     There is no height or weight on file to calculate BMI.  Orders:  Orders Placed This Encounter  Procedures  . XR Ankle Complete Right   No orders of the defined types were placed in this encounter.    Procedures: No procedures performed  Clinical Data: No additional findings.  ROS:  All other systems negative, except as noted in the HPI. Review of Systems  Objective: Vital Signs: LMP 08/23/2012   Specialty Comments:  No specialty comments available.  PMFS History: Patient Active Problem List   Diagnosis Date Noted  . Encounter for general adult medical examination with abnormal findings 03/12/2020  . Stage 3a chronic kidney disease ([place]) 03/12/2020  . Gastroesophageal  reflux disease without esophagitis 02/13/2019  . Screening for cervical cancer 02/13/2019  . Visit for screening mammogram 02/13/2019  . Vitamin D deficiency disease 10/05/2017  . Obesity 06/08/2016  . DDD (degenerative disc disease), lumbar 05/21/2014  . Essential hypertension 05/21/2014   Past Medical History:  Diagnosis Date  . Ankle fracture    right  . DDD (degenerative disc disease), lumbar   . Gall stones   . Hypertension     Family History  Problem Relation Age of Onset  . Pancreatic cancer Mother        Deceased  . Hypertension Father   . Other Father        stomach aneurysm  . Deep vein thrombosis Brother        Deceased    Past Surgical History:   Procedure Laterality Date  . ANKLE FUSION Left 07/21/2017   Procedure: SUBTALAR AND TALONAVICULAR FUSION LEFT FOOT;  Surgeon: Newt Minion, MD;  Location: [facility];  Service: Orthopedics;  Laterality: Left;  . AXILLARY LYMPH NODE DISSECTION Left 11/24/2012   Procedure: incision and drainage axillary abscess;  Surgeon: Adin Hector, MD;  Location: [place];  Service: General;  Laterality: Left;  . BREAST SURGERY  2012   breast biopsy  . CHOLECYSTECTOMY    . ORIF ANKLE FRACTURE Right 11/22/2019   Procedure: OPEN REDUCTION INTERNAL FIXATION (ORIF) RIGHT ANKLE FRACTURE;  Surgeon: Newt Minion, MD;  Location: [facility];  Service: Orthopedics;  Laterality: Right;  . TUBAL LIGATION     Social History   Occupational History  . Occupation: Scientist, research (medical)  Tobacco Use  . Smoking status: Never Smoker  . Smokeless tobacco: Never Used  Vaping Use  . Vaping Use: Never used  Substance and Sexual Activity  . Alcohol use: Not Currently    Alcohol/week: 0.0 standard drinks  . Drug use: No  . Sexual activity: Not Currently    Partners: Male    Birth control/protection: Post-menopausal

## 2020-04-12 ENCOUNTER — Other Ambulatory Visit: Payer: Self-pay

## 2020-04-12 ENCOUNTER — Ambulatory Visit (INDEPENDENT_AMBULATORY_CARE_PROVIDER_SITE_OTHER): Payer: BC Managed Care – PPO | Admitting: Physical Therapy

## 2020-04-12 DIAGNOSIS — R2681 Unsteadiness on feet: Secondary | ICD-10-CM

## 2020-04-12 DIAGNOSIS — M25671 Stiffness of right ankle, not elsewhere classified: Secondary | ICD-10-CM

## 2020-04-12 DIAGNOSIS — M25571 Pain in right ankle and joints of right foot: Secondary | ICD-10-CM

## 2020-04-12 DIAGNOSIS — M6281 Muscle weakness (generalized): Secondary | ICD-10-CM

## 2020-04-12 DIAGNOSIS — R600 Localized edema: Secondary | ICD-10-CM | POA: Diagnosis not present

## 2020-04-12 DIAGNOSIS — R2689 Other abnormalities of gait and mobility: Secondary | ICD-10-CM

## 2020-04-12 NOTE — Therapy (Signed)
[place], [place], [address] Phone: [phone]   Fax:  [phone]  Physical Therapy Treatment  Patient Details  Name: Jamie Jenkins MRN: [id number] Date of Birth: [birth year] Referring Provider (PT): Bevely Palmer Persons, [place]   Encounter Date: 04/12/2020   PT End of Session - 04/12/20 3300    Visit Number 29    Number of Visits 38   updated based on recert on 76/04/2631   Date for PT Re-Evaluation 06/07/20    Authorization Type [employer]BS Comm PPO    PT Start Time [phone]    PT Stop Time 0934    PT Time Calculation (min) 51 min    Equipment Utilized During Treatment --   CAM boot   Activity Tolerance Patient limited by pain;Patient tolerated treatment well    Behavior During Therapy [facility] for tasks assessed/performed           Past Medical History:  Diagnosis Date  . Ankle fracture    right  . DDD (degenerative disc disease), lumbar   . Gall stones   . Hypertension     Past Surgical History:  Procedure Laterality Date  . ANKLE FUSION Left 07/21/2017   Procedure: SUBTALAR AND TALONAVICULAR FUSION LEFT FOOT;  Surgeon: Newt Minion, MD;  Location: [place];  Service: Orthopedics;  Laterality: Left;  . AXILLARY LYMPH NODE DISSECTION Left 11/24/2012   Procedure: incision and drainage axillary abscess;  Surgeon: Adin Hector, MD;  Location: [place];  Service: General;  Laterality: Left;  . BREAST SURGERY  2012   breast biopsy  . CHOLECYSTECTOMY    . ORIF ANKLE FRACTURE Right 11/22/2019   Procedure: OPEN REDUCTION INTERNAL FIXATION (ORIF) RIGHT ANKLE FRACTURE;  Surgeon: Newt Minion, MD;  Location: [facility];  Service: Orthopedics;  Laterality: Right;  . TUBAL LIGATION      There were no vitals filed for this visit.   Subjective Assessment - 04/12/20 0848    Subjective Patient saw the Dr. and relays there was no sign of infection and wants her to continue to come to PT and revisit in 2 months. She reports pain is 6/10 and  feeling stiff today.    Pertinent History 11/22/2019 right ankle trimalleolar fx w/ORIF, left subtalar & talonavicular fusion 07/21/17, DDD, HTN    How long can you sit comfortably? 20 minutes    How long can you stand comfortably? 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 Yes    Pain Score 6     Pain Location Ankle    Pain Orientation Right;Lateral    Pain Onset More than a month ago              [facility] PT Assessment - 04/12/20 0001      ROM / Strength   AROM / PROM / Strength PROM;AROM;Strength      AROM   Right Ankle Dorsiflexion 3    Right Ankle Plantar Flexion 37      PROM   Right Ankle Dorsiflexion 4    Right Ankle Plantar Flexion 39      Strength   Right Ankle Dorsiflexion 5/5    Right Ankle Plantar Flexion 4/5    Right Ankle Inversion 4/5    Right Ankle Eversion 4+/5  [place] Adult PT Treatment/Exercise - 04/12/20 0001      Vasopneumatic   Number Minutes Vasopneumatic  10 minutes    Vasopnuematic Location  Ankle    Vasopneumatic Pressure Medium    Vasopneumatic Temperature  34      Manual Therapy   Manual Therapy Soft tissue mobilization    Soft tissue mobilization 5 mins to lateral ankle scar with biofreeze      Ankle Exercises: Aerobic   Nustep 8 min seat 11 level 4      Ankle Exercises: Stretches   Soleus Stretch 2 reps;20 seconds    Gastroc Stretch 4 reps;20 seconds      Ankle Exercises: Standing   Rocker Board 2 minutes;5 minutes   DF/PF, balance both directions with fingertips supoert   Heel Raises Limitations Heel and toe raises without UE support 2 sets of 10   on airex   Balance Beam tandem stance on airex 20 sec bilat x4    Other Standing Ankle Exercises R 4'' step up 10 w/o UE, 10 w/ UE; DF/PF off step stretch 20 sec x2      Ankle Exercises: Seated   Towel Crunch 5 reps   2  sec hold   Other Seated Ankle Exercises windshield wiper on towel 5x                    PT Short Term Goals - 03/26/20 0850      PT SHORT TERM GOAL #1   Title RLE single leg stance >5 sec.    Baseline MET 03/26/2020    Status Achieved    Target Date 03/27/20      PT SHORT TERM GOAL #2   Title Pt ambulates on indoor surfaces without assistive device with proper step width with cues only.    Baseline MET 03/26/2020    Status Achieved    Target Date 04/20/20             PT Long Term Goals - 03/20/20 1106      PT LONG TERM GOAL #1   Title Patient demonstrates & verbalizes understanding of ongoing HEP.    Baseline 1222/2021 Pt verbalizes understanding of HEP to date but PT continues to progress program as appropriate.    Time 2    Period Months    Status On-going    Target Date 04/20/20      PT LONG TERM GOAL #2   Title FOTO score >/= 60% functional or <40% limited    Baseline 02/20/2020 FOTO  55.2644  improved from 25% at initial evaluation    Time 2    Period Months    Status On-going    Target Date 04/20/20      PT LONG TERM GOAL #3   Title right ankle range within 10* of left ankle range.    Baseline 02/20/2020 see PT assessment tab for seated ankle DF & PF PROM & AROM  improved from eval but not LTG not met    Time 2    Period Months    Status On-going    Target Date 04/20/20      PT LONG TERM GOAL #4   Title Patient ambulates >500' and negotiates ramps, curbs & stairs without device independently.    Baseline can not yet do stairs    Time 2    Period Months    Status On-going    Target Date 04/20/20      PT LONG TERM GOAL #5  Title Right single leg stance >10seconds    Baseline 7 sec at best    Time 2    Period Months    Status On-going    Target Date 04/20/20      PT LONG TERM GOAL #6   Title right ankle & foot pain </= 2/10 with standing & gait activities    Baseline 02/20/2020  Pt reports pain ranges from 4/10 at best for standing / gait  activities    Time 2    Period Months    Status On-going    Target Date 04/20/20                 Plan - 04/12/20 7741    Clinical Impression Statement Updated patient ROM and strength today to reassess her progress toward meeting her goals and decreasing pain. She continues to struggle with her pain control over the scar. She demonstrates more ROM with passive WB stretching but still at a deficit actively and when passively stretched. Her strength has progressed slightly but continues to show weakness with plantarflexion and inversion. She struggles with ascending and descending stairs. Patient would benefit from continued skilled PT for 2 months to progress strenghtening and mobility. Her balance and coordination is progressing well but does notice fatigue after standing and walking.    Personal Factors and Comorbidities Comorbidity 3+    Comorbidities left subtalar & talonavicular fusion 07/21/17, DDD, HTN    Examination-Activity Limitations Lift;Locomotion Level;Squat;Stairs;Sit;Stand    Examination-Participation Musician;Occupation    Stability/Clinical Decision Making Stable/Uncomplicated    Rehab Potential Good    PT Frequency 2x / week    PT Duration Other (comment)   9 weeks   PT Treatment/Interventions ADLs/Self Care Home Management;Electrical Stimulation;Cryotherapy;DME Instruction;Gait training;Stair training;Contrast Bath;Functional mobility training;Therapeutic activities;Therapeutic exercise;Balance training;Neuromuscular re-education;Patient/family education;Manual techniques;Scar mobilization;Passive range of motion;Dry needling;Vasopneumatic Device;Vestibular;Joint Manipulations    PT Next Visit Plan work towards LTGs, ankle range, weight bearing tolerance and balance, gait  vasopneumatic for decreased swelling    PT Home Exercise Plan Access Code: [email or web address]    Consulted and Agree with Plan of Care Patient           Patient will benefit  from skilled therapeutic intervention in order to improve the following deficits and impairments:  Abnormal gait,Decreased balance,Decreased endurance,Decreased mobility,Decreased range of motion,Decreased scar mobility,Decreased strength,Increased edema,Impaired flexibility,Pain  Visit Diagnosis: Localized edema  Stiffness of right ankle, not elsewhere classified  Pain in right ankle and joints of right foot  Muscle weakness (generalized)  Unsteadiness on feet  Other abnormalities of gait and mobility     Problem List Patient Active Problem List   Diagnosis Date Noted  . Encounter for general adult medical examination with abnormal findings 03/12/2020  . Stage 3a chronic kidney disease ([place]) 03/12/2020  . Gastroesophageal reflux disease without esophagitis 02/13/2019  . Screening for cervical cancer 02/13/2019  . Visit for screening mammogram 02/13/2019  . Vitamin D deficiency disease 10/05/2017  . Obesity 06/08/2016  . DDD (degenerative disc disease), lumbar 05/21/2014  . Essential hypertension 05/21/2014    Glenetta Hew, SPT 04/12/2020, 9:59 AM   During this treatment session, this physical therapist was present, participating in and directing the treatment.   This note has been reviewed and this clinician agrees with the information provided.  Elsie Ra, PT, DPT 04/12/20 11:26 AM   [facility] Physical Therapy [address] [place], [place], [address] Phone: [phone]   Fax:  [phone]  Name: Jamie Jenkins MRN: [id number]  Date of Birth: October 17, 1960

## 2020-04-16 ENCOUNTER — Other Ambulatory Visit: Payer: Self-pay

## 2020-04-16 ENCOUNTER — Ambulatory Visit (INDEPENDENT_AMBULATORY_CARE_PROVIDER_SITE_OTHER): Payer: BC Managed Care – PPO | Admitting: Physical Therapy

## 2020-04-16 DIAGNOSIS — M25571 Pain in right ankle and joints of right foot: Secondary | ICD-10-CM

## 2020-04-16 DIAGNOSIS — R2689 Other abnormalities of gait and mobility: Secondary | ICD-10-CM

## 2020-04-16 DIAGNOSIS — M25671 Stiffness of right ankle, not elsewhere classified: Secondary | ICD-10-CM

## 2020-04-16 DIAGNOSIS — R600 Localized edema: Secondary | ICD-10-CM

## 2020-04-16 DIAGNOSIS — M6281 Muscle weakness (generalized): Secondary | ICD-10-CM | POA: Diagnosis not present

## 2020-04-16 DIAGNOSIS — R2681 Unsteadiness on feet: Secondary | ICD-10-CM

## 2020-04-16 NOTE — Therapy (Signed)
[facility] [place], [place], [address] Phone: [phone]   Fax:  [phone]  Physical Therapy Treatment  Patient Details  Name: Jamie Jenkins MRN: [id number] Date of Birth: [birth year] Referring Provider (PT): Bevely Palmer Persons, [place]   Encounter Date: 04/16/2020   PT End of Session - 04/16/20 0803    Visit Number 30    Number of Visits 38   updated based on recert on 7/98/9211   Date for PT Re-Evaluation 06/07/20    Authorization Type [employer] PPO    PT Start Time 0800    PT Stop Time 0845    PT Time Calculation (min) 45 min    Equipment Utilized During Treatment --   CAM boot   Activity Tolerance Patient limited by pain;Patient tolerated treatment well    Behavior During Therapy [facility] for tasks assessed/performed           Past Medical History:  Diagnosis Date  . Ankle fracture    right  . DDD (degenerative disc disease), lumbar   . Gall stones   . Hypertension     Past Surgical History:  Procedure Laterality Date  . ANKLE FUSION Left 07/21/2017   Procedure: SUBTALAR AND TALONAVICULAR FUSION LEFT FOOT;  Surgeon: Newt Minion, MD;  Location: [place];  Service: Orthopedics;  Laterality: Left;  . AXILLARY LYMPH NODE DISSECTION Left 11/24/2012   Procedure: incision and drainage axillary abscess;  Surgeon: Adin Hector, MD;  Location: [place];  Service: General;  Laterality: Left;  . BREAST SURGERY  2012   breast biopsy  . CHOLECYSTECTOMY    . ORIF ANKLE FRACTURE Right 11/22/2019   Procedure: OPEN REDUCTION INTERNAL FIXATION (ORIF) RIGHT ANKLE FRACTURE;  Surgeon: Newt Minion, MD;  Location: [place];  Service: Orthopedics;  Laterality: Right;  . TUBAL LIGATION      There were no vitals filed for this visit.   Subjective Assessment - 04/16/20 0802    Subjective Jamie Jenkins reports 4/10 pain today and the usual stiffness.    Pertinent History 11/22/2019 right ankle trimalleolar fx w/ORIF, left subtalar & talonavicular fusion  07/21/17, DDD, HTN    How long can you sit comfortably? 20 minutes    How long can you stand comfortably? 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 Yes    Pain Score 4     Pain Location Ankle    Pain Orientation Right;Lateral    Pain Onset More than a month ago            [facility] Adult PT Treatment/Exercise - 04/16/20 0001      Vasopneumatic   Number Minutes Vasopneumatic  10 minutes    Vasopnuematic Location  Ankle    Vasopneumatic Pressure High    Vasopneumatic Temperature  34      Ankle Exercises: Aerobic   Nustep 8 min L4 4 mins, L6 4 mins seat 11      Ankle Exercises: Stretches   Soleus Stretch 2 reps;20 seconds    Gastroc Stretch 4 reps;20 seconds    Other Stretch MWM with strap(Red Tband) on slant board for increased DF 10 sec x6      Ankle Exercises: Seated   Towel Crunch Other (comment)   10x with 3 sec hold   Other Seated Ankle Exercises BAPS board L3 CW 10x CCW 10x, PF/DF 10x;  windshield wipers on towel 10x      Ankle Exercises: Standing   Rocker Board 2 minutes    Heel Raises Limitations weight shifting staggered stance 5 sec hold with UE support 10x, heel raises with UE support    Balance Beam tandem stance on airex 20 sec 4x with fingertip UE PRN                    PT Short Term Goals - 03/26/20 0850      PT SHORT TERM GOAL #1   Title RLE single leg stance >5 sec.    Baseline MET 03/26/2020    Status Achieved    Target Date 03/27/20      PT SHORT TERM GOAL #2   Title Pt ambulates on indoor surfaces without assistive device with proper step width with cues only.    Baseline MET 03/26/2020    Status Achieved    Target Date 04/20/20             PT Long Term Goals - 03/20/20 1106      PT LONG TERM GOAL #1   Title Patient demonstrates & verbalizes understanding of ongoing HEP.    Baseline 1222/2021 Pt  verbalizes understanding of HEP to date but PT continues to progress program as appropriate.    Time 2    Period Months    Status On-going    Target Date 04/20/20      PT LONG TERM GOAL #2   Title FOTO score >/= 60% functional or <40% limited    Baseline 02/20/2020 FOTO  55.2644  improved from 25% at initial evaluation    Time 2    Period Months    Status On-going    Target Date 04/20/20      PT LONG TERM GOAL #3   Title right ankle range within 10* of left ankle range.    Baseline 02/20/2020 see PT assessment tab for seated ankle DF & PF PROM & AROM  improved from eval but not LTG not met    Time 2    Period Months    Status On-going    Target Date 04/20/20      PT LONG TERM GOAL #4   Title Patient ambulates >500' and negotiates ramps, curbs & stairs without device independently.    Baseline can not yet do stairs    Time 2    Period Months    Status On-going    Target Date 04/20/20      PT LONG TERM GOAL #5   Title Right single leg stance >10seconds    Baseline 7 sec at best    Time 2    Period Months    Status On-going    Target Date 04/20/20      PT LONG TERM GOAL #6   Title right ankle & foot pain </= 2/10 with standing & gait activities    Baseline 02/20/2020  Pt reports pain ranges from 4/10 at best for standing / gait activities    Time 2    Period Months    Status On-going    Target Date 04/20/20                 Plan - 04/16/20 2725    Clinical Impression Statement Patient had decreased pain today with treatment. Tandem stance balance has progressed but patient is lacking the ability to WB on R leg for more than 5 seconds without pain making it difficult to  progress her balance. She will benefit from increased mobility and weight shifting to increase tolerance to weight on R side to begin functional stability and stairs.    Personal Factors and Comorbidities Comorbidity 3+    Comorbidities left subtalar & talonavicular fusion 07/21/17, DDD, HTN     Examination-Activity Limitations Lift;Locomotion Level;Squat;Stairs;Sit;Stand    Examination-Participation Musician;Occupation    Stability/Clinical Decision Making Stable/Uncomplicated    Rehab Potential Good    PT Frequency 2x / week    PT Duration Other (comment)   9 weeks   PT Treatment/Interventions ADLs/Self Care Home Management;Electrical Stimulation;Cryotherapy;DME Instruction;Gait training;Stair training;Contrast Bath;Functional mobility training;Therapeutic activities;Therapeutic exercise;Balance training;Neuromuscular re-education;Patient/family education;Manual techniques;Scar mobilization;Passive range of motion;Dry needling;Vasopneumatic Device;Vestibular;Joint Manipulations    PT Next Visit Plan weight shifting, ankle range DF/PF weight bearing tolerance and balance, gait  vasopneumatic for decreased swelling    PT Home Exercise Plan Access Code: [email or web address]    Consulted and Agree with Plan of Care Patient           Patient will benefit from skilled therapeutic intervention in order to improve the following deficits and impairments:  Abnormal gait,Decreased balance,Decreased endurance,Decreased mobility,Decreased range of motion,Decreased scar mobility,Decreased strength,Increased edema,Impaired flexibility,Pain  Visit Diagnosis: Localized edema  Stiffness of right ankle, not elsewhere classified  Pain in right ankle and joints of right foot  Muscle weakness (generalized)  Unsteadiness on feet  Other abnormalities of gait and mobility     Problem List Patient Active Problem List   Diagnosis Date Noted  . Encounter for general adult medical examination with abnormal findings 03/12/2020  . Stage 3a chronic kidney disease ([place]) 03/12/2020  . Gastroesophageal reflux disease without esophagitis 02/13/2019  . Screening for cervical cancer 02/13/2019  . Visit for screening mammogram 02/13/2019  . Vitamin D deficiency disease 10/05/2017  .  Obesity 06/08/2016  . DDD (degenerative disc disease), lumbar 05/21/2014  . Essential hypertension 05/21/2014    Jamie Jenkins, SPT 04/16/2020, 8:43 AM  During this treatment session, this physical therapist was present, participating in and directing the treatment.   This note has been reviewed and this clinician agrees with the information provided.  Jamie Jenkins, PT, DPT 04/16/20 9:44 AM   [facility] Physical Therapy [address] [place], [place], [address] Phone: [phone]   Fax:  [phone]  Name: Jamie Jenkins MRN: [id number] Date of Birth: 11-23-1960

## 2020-04-19 ENCOUNTER — Other Ambulatory Visit: Payer: Self-pay

## 2020-04-19 ENCOUNTER — Ambulatory Visit (INDEPENDENT_AMBULATORY_CARE_PROVIDER_SITE_OTHER): Payer: BC Managed Care – PPO | Admitting: Physical Therapy

## 2020-04-19 DIAGNOSIS — M25671 Stiffness of right ankle, not elsewhere classified: Secondary | ICD-10-CM | POA: Diagnosis not present

## 2020-04-19 DIAGNOSIS — M25571 Pain in right ankle and joints of right foot: Secondary | ICD-10-CM | POA: Diagnosis not present

## 2020-04-19 DIAGNOSIS — R600 Localized edema: Secondary | ICD-10-CM

## 2020-04-19 DIAGNOSIS — R2681 Unsteadiness on feet: Secondary | ICD-10-CM

## 2020-04-19 DIAGNOSIS — R2689 Other abnormalities of gait and mobility: Secondary | ICD-10-CM

## 2020-04-19 DIAGNOSIS — M6281 Muscle weakness (generalized): Secondary | ICD-10-CM

## 2020-04-19 NOTE — Therapy (Signed)
[facility], [place], [address] Phone: [phone]   Fax:  [phone]  Physical Therapy Treatment  Patient Details  Name: Jamie Jenkins MRN: [id number] Date of Birth: 1960-10-17 Referring Provider (PT): Bevely Palmer Persons, [place]   Encounter Date: 04/19/2020   PT End of Session - 04/19/20 0916    Visit Number 31    Number of Visits 38   updated based on recert on 05/28/9456   Date for PT Re-Evaluation 06/07/20    Authorization Type [employer] Comm PPO    PT Start Time 0803    PT Stop Time 0856    PT Time Calculation (min) 53 min    Equipment Utilized During Treatment --   CAM boot   Activity Tolerance Patient limited by pain;Patient tolerated treatment well    Behavior During Therapy [facility] for tasks assessed/performed           Past Medical History:  Diagnosis Date  . Ankle fracture    right  . DDD (degenerative disc disease), lumbar   . Gall stones   . Hypertension     Past Surgical History:  Procedure Laterality Date  . ANKLE FUSION Left 07/21/2017   Procedure: SUBTALAR AND TALONAVICULAR FUSION LEFT FOOT;  Surgeon: Newt Minion, MD;  Location: [facility];  Service: Orthopedics;  Laterality: Left;  . AXILLARY LYMPH NODE DISSECTION Left 11/24/2012   Procedure: incision and drainage axillary abscess;  Surgeon: Adin Hector, MD;  Location: [place];  Service: General;  Laterality: Left;  . BREAST SURGERY  2012   breast biopsy  . CHOLECYSTECTOMY    . ORIF ANKLE FRACTURE Right 11/22/2019   Procedure: OPEN REDUCTION INTERNAL FIXATION (ORIF) RIGHT ANKLE FRACTURE;  Surgeon: Newt Minion, MD;  Location: [place];  Service: Orthopedics;  Laterality: Right;  . TUBAL LIGATION      There were no vitals filed for this visit.   Subjective Assessment - 04/19/20 0808    Subjective Patient's pain is 4/10 today. Last night she had to take a pill for her pain but it is a little better today.    Pertinent History 11/22/2019 right ankle  trimalleolar fx w/ORIF, left subtalar & talonavicular fusion 07/21/17, DDD, HTN    How long can you sit comfortably? 20 minutes    How long can you stand comfortably? 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 Yes    Pain Score 4     Pain Location Ankle    Pain Orientation Right;Lateral    Pain Onset More than a month ago             [facility] Adult PT Treatment/Exercise - 04/19/20 0001      Vasopneumatic   Number Minutes Vasopneumatic  10 minutes    Vasopnuematic Location  Ankle    Vasopneumatic Pressure High    Vasopneumatic Temperature  34      Ankle Exercises: Seated   Towel Crunch Other (comment)   10x 2 second hold   Other Seated Ankle Exercises BAPS board L3 CW 10x CCW 10x,rocker board  PF/DF 10x; windshield wipers on towel 10x      Ankle Exercises: Standing   SLS 30 secx3    Heel Raises Limitations heel and toe raises 20x    Balance Beam tandem stance on airex 20 sec 4x with fingertip UE PRN  Other Standing Ankle Exercises weight shifting on airex without shoe 10x 5 sec hold      Ankle Exercises: Aerobic   Stationary Bike 8 mins L4      Ankle Exercises: Stretches   Soleus Stretch 2 reps;20 seconds    Gastroc Stretch 4 reps;20 seconds    Other Stretch MWM with strap(Red Tband) on slant board for increased DF 10 sec x6                    PT Short Term Goals - 03/26/20 0850      PT SHORT TERM GOAL #1   Title RLE single leg stance >5 sec.    Baseline MET 03/26/2020    Status Achieved    Target Date 03/27/20      PT SHORT TERM GOAL #2   Title Pt ambulates on indoor surfaces without assistive device with proper step width with cues only.    Baseline MET 03/26/2020    Status Achieved    Target Date 04/20/20             PT Long Term Goals - 03/20/20 1106      PT LONG TERM GOAL #1   Title Patient demonstrates &  verbalizes understanding of ongoing HEP.    Baseline 1222/2021 Pt verbalizes understanding of HEP to date but PT continues to progress program as appropriate.    Time 2    Period Months    Status On-going    Target Date 04/20/20      PT LONG TERM GOAL #2   Title FOTO score >/= 60% functional or <40% limited    Baseline 02/20/2020 FOTO  55.2644  improved from 25% at initial evaluation    Time 2    Period Months    Status On-going    Target Date 04/20/20      PT LONG TERM GOAL #3   Title right ankle range within 10* of left ankle range.    Baseline 02/20/2020 see PT assessment tab for seated ankle DF & PF PROM & AROM  improved from eval but not LTG not met    Time 2    Period Months    Status On-going    Target Date 04/20/20      PT LONG TERM GOAL #4   Title Patient ambulates >500' and negotiates ramps, curbs & stairs without device independently.    Baseline can not yet do stairs    Time 2    Period Months    Status On-going    Target Date 04/20/20      PT LONG TERM GOAL #5   Title Right single leg stance >10seconds    Baseline 7 sec at best    Time 2    Period Months    Status On-going    Target Date 04/20/20      PT LONG TERM GOAL #6   Title right ankle & foot pain </= 2/10 with standing & gait activities    Baseline 02/20/2020  Pt reports pain ranges from 4/10 at best for standing / gait activities    Time 2    Period Months    Status On-going    Target Date 04/20/20                 Plan - 04/19/20 6213    Clinical Impression Statement Patient is still limited by her pain but is progressing well in her ability to [facility] for longer periods of time.  Continue to progress SL exercises as tolerated and mobility in WB.    Personal Factors and Comorbidities Comorbidity 3+    Comorbidities left subtalar & talonavicular fusion 07/21/17, DDD, HTN    Examination-Activity Limitations Lift;Locomotion Level;Squat;Stairs;Sit;Stand    Examination-Participation Aeronautical engineer;Occupation    Stability/Clinical Decision Making Stable/Uncomplicated    Rehab Potential Good    PT Frequency 2x / week    PT Duration Other (comment)   9 weeks   PT Treatment/Interventions ADLs/Self Care Home Management;Electrical Stimulation;Cryotherapy;DME Instruction;Gait training;Stair training;Contrast Bath;Functional mobility training;Therapeutic activities;Therapeutic exercise;Balance training;Neuromuscular re-education;Patient/family education;Manual techniques;Scar mobilization;Passive range of motion;Dry needling;Vasopneumatic Device;Vestibular;Joint Manipulations    PT Next Visit Plan work with incline tradmill, weight bearing tolerance and balance, gait  vasopneumatic for decreased swelling    PT Home Exercise Plan Access Code: [email or web address]    Consulted and Agree with Plan of Care Patient           Patient will benefit from skilled therapeutic intervention in order to improve the following deficits and impairments:  Abnormal gait,Decreased balance,Decreased endurance,Decreased mobility,Decreased range of motion,Decreased scar mobility,Decreased strength,Increased edema,Impaired flexibility,Pain  Visit Diagnosis: Localized edema  Stiffness of right ankle, not elsewhere classified  Pain in right ankle and joints of right foot  Muscle weakness (generalized)  Unsteadiness on feet  Other abnormalities of gait and mobility     Problem List Patient Active Problem List   Diagnosis Date Noted  . Encounter for general adult medical examination with abnormal findings 03/12/2020  . Stage 3a chronic kidney disease ([place]) 03/12/2020  . Gastroesophageal reflux disease without esophagitis 02/13/2019  . Screening for cervical cancer 02/13/2019  . Visit for screening mammogram 02/13/2019  . Vitamin D deficiency disease 10/05/2017  . Obesity 06/08/2016  . DDD (degenerative disc disease), lumbar 05/21/2014  . Essential hypertension 05/21/2014    Glenetta Hew, SPT 04/19/2020, 10:14 AM   During this treatment session, this physical therapist was present, participating in and directing the treatment.   This note has been reviewed and this clinician agrees with the information provided.  Elsie Ra, PT, DPT 04/19/20 10:39 AM   [facility] Physical Therapy [address] [place], [place], [address] Phone: [phone]   Fax:  [phone]  Name: Valentine Kuechle MRN: [id number] Date of Birth: 05/21/1960

## 2020-04-24 ENCOUNTER — Ambulatory Visit (INDEPENDENT_AMBULATORY_CARE_PROVIDER_SITE_OTHER): Payer: BC Managed Care – PPO | Admitting: Physical Therapy

## 2020-04-24 ENCOUNTER — Other Ambulatory Visit: Payer: Self-pay

## 2020-04-24 DIAGNOSIS — R2689 Other abnormalities of gait and mobility: Secondary | ICD-10-CM

## 2020-04-24 DIAGNOSIS — M6281 Muscle weakness (generalized): Secondary | ICD-10-CM

## 2020-04-24 DIAGNOSIS — R600 Localized edema: Secondary | ICD-10-CM | POA: Diagnosis not present

## 2020-04-24 DIAGNOSIS — M25571 Pain in right ankle and joints of right foot: Secondary | ICD-10-CM

## 2020-04-24 DIAGNOSIS — R2681 Unsteadiness on feet: Secondary | ICD-10-CM | POA: Diagnosis not present

## 2020-04-24 DIAGNOSIS — M25671 Stiffness of right ankle, not elsewhere classified: Secondary | ICD-10-CM | POA: Diagnosis not present

## 2020-04-24 NOTE — Therapy (Signed)
[facility], [place], [address] Phone: [phone]   Fax:  [phone]  Physical Therapy Treatment  Patient Details  Name: Jamie Jenkins MRN: [id number] Date of Birth: 18-Oct-1960 Referring Provider (PT): Bevely Palmer Persons, [place]   Encounter Date: 04/24/2020   PT End of Session - 04/24/20 0848    Visit Number 32    Number of Visits 38   updated based on recert on 6/81/1572   Date for PT Re-Evaluation 06/07/20    Authorization Type [employer] PPO    PT Start Time 0802    PT Stop Time 0850    PT Time Calculation (min) 48 min    Equipment Utilized During Treatment --    Activity Tolerance Patient limited by pain;Patient tolerated treatment well    Behavior During Therapy [facility] for tasks assessed/performed           Past Medical History:  Diagnosis Date  . Ankle fracture    right  . DDD (degenerative disc disease), lumbar   . Gall stones   . Hypertension     Past Surgical History:  Procedure Laterality Date  . ANKLE FUSION Left 07/21/2017   Procedure: SUBTALAR AND TALONAVICULAR FUSION LEFT FOOT;  Surgeon: Newt Minion, MD;  Location: [place];  Service: Orthopedics;  Laterality: Left;  . AXILLARY LYMPH NODE DISSECTION Left 11/24/2012   Procedure: incision and drainage axillary abscess;  Surgeon: Adin Hector, MD;  Location: [place];  Service: General;  Laterality: Left;  . BREAST SURGERY  2012   breast biopsy  . CHOLECYSTECTOMY    . ORIF ANKLE FRACTURE Right 11/22/2019   Procedure: OPEN REDUCTION INTERNAL FIXATION (ORIF) RIGHT ANKLE FRACTURE;  Surgeon: Newt Minion, MD;  Location: [place];  Service: Orthopedics;  Laterality: Right;  . TUBAL LIGATION      There were no vitals filed for this visit.   Subjective Assessment - 04/24/20 0802    Subjective Patient states she is feeling pretty good, pain is 3/10.    Pertinent History 11/22/2019 right ankle trimalleolar fx w/ORIF, left subtalar & talonavicular fusion 07/21/17,  DDD, HTN    How long can you sit comfortably? 20 minutes    How long can you stand comfortably? 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 Yes    Pain Score 3     Pain Location Ankle    Pain Orientation Right;Lateral    Pain Onset More than a month ago                             [facility] Adult PT Treatment/Exercise - 04/24/20 0001      Exercises   Exercises Ankle      Vasopneumatic   Number Minutes Vasopneumatic  10 minutes    Vasopnuematic Location  Ankle    Vasopneumatic Pressure High    Vasopneumatic Temperature  34      Ankle Exercises: Stretches   Soleus Stretch 2 reps;20 seconds    Gastroc Stretch 4 reps;20 seconds    Other Stretch MWM with strap(Red Tband) on slant board for increased DF 10 sec x10      Ankle Exercises: Aerobic   Nustep 4 min L4, 4 min L6      Ankle Exercises: Standing   SLS 30 secx3  Rocker Board 3 minutes    Heel Raises Limitations heel and toe raises 20x    Other Standing Ankle Exercises staggered stance bosu ball mini lunge bilat 2x10      Ankle Exercises: Seated   Towel Crunch 5 reps;Other (comment)   3x5 3sec hold at top   Other Seated Ankle Exercises ankle inversion isometrics with ball squeeze 5 sec hold 10x    Other Seated Ankle Exercises BAPS board L3 CW 10x CCW 10x,rocker board  PF/DF 10x; windshield wipers on towel 10x                    PT Short Term Goals - 03/26/20 0850      PT SHORT TERM GOAL #1   Title RLE single leg stance >5 sec.    Baseline MET 03/26/2020    Status Achieved    Target Date 03/27/20      PT SHORT TERM GOAL #2   Title Pt ambulates on indoor surfaces without assistive device with proper step width with cues only.    Baseline MET 03/26/2020    Status Achieved    Target Date 04/20/20             PT Long Term Goals - 03/20/20 1106      PT LONG  TERM GOAL #1   Title Patient demonstrates & verbalizes understanding of ongoing HEP.    Baseline 1222/2021 Pt verbalizes understanding of HEP to date but PT continues to progress program as appropriate.    Time 2    Period Months    Status On-going    Target Date 04/20/20      PT LONG TERM GOAL #2   Title FOTO score >/= 60% functional or <40% limited    Baseline 02/20/2020 FOTO  55.2644  improved from 25% at initial evaluation    Time 2    Period Months    Status On-going    Target Date 04/20/20      PT LONG TERM GOAL #3   Title right ankle range within 10* of left ankle range.    Baseline 02/20/2020 see PT assessment tab for seated ankle DF & PF PROM & AROM  improved from eval but not LTG not met    Time 2    Period Months    Status On-going    Target Date 04/20/20      PT LONG TERM GOAL #4   Title Patient ambulates >500' and negotiates ramps, curbs & stairs without device independently.    Baseline can not yet do stairs    Time 2    Period Months    Status On-going    Target Date 04/20/20      PT LONG TERM GOAL #5   Title Right single leg stance >10seconds    Baseline 7 sec at best    Time 2    Period Months    Status On-going    Target Date 04/20/20      PT LONG TERM GOAL #6   Title right ankle & foot pain </= 2/10 with standing & gait activities    Baseline 02/20/2020  Pt reports pain ranges from 4/10 at best for standing / gait activities    Time 2    Period Months    Status On-going    Target Date 04/20/20                 Plan - 04/24/20 7741    Clinical Impression Statement  Patient is making strides toward tolerating more WB activities at a time without breaks. Continue to progress single leg balance and add in dynamic balance to provide stability for functional tasks.    Personal Factors and Comorbidities Comorbidity 3+    Comorbidities left subtalar & talonavicular fusion 07/21/17, DDD, HTN    Examination-Activity Limitations Lift;Locomotion  Level;Squat;Stairs;Sit;Stand    Examination-Participation Musician;Occupation    Stability/Clinical Decision Making Stable/Uncomplicated    Rehab Potential Good    PT Frequency 2x / week    PT Duration Other (comment)   9 weeks   PT Treatment/Interventions ADLs/Self Care Home Management;Electrical Stimulation;Cryotherapy;DME Instruction;Gait training;Stair training;Contrast Bath;Functional mobility training;Therapeutic activities;Therapeutic exercise;Balance training;Neuromuscular re-education;Patient/family education;Manual techniques;Scar mobilization;Passive range of motion;Dry needling;Vasopneumatic Device;Vestibular;Joint Manipulations    PT Next Visit Plan work with incline treadmill, stairs, gait  vasopneumatic for decreased swelling    PT Home Exercise Plan Access Code: [email or web address]    Consulted and Agree with Plan of Care Patient           Patient will benefit from skilled therapeutic intervention in order to improve the following deficits and impairments:  Abnormal gait,Decreased balance,Decreased endurance,Decreased mobility,Decreased range of motion,Decreased scar mobility,Decreased strength,Increased edema,Impaired flexibility,Pain  Visit Diagnosis: Localized edema  Stiffness of right ankle, not elsewhere classified  Muscle weakness (generalized)  Unsteadiness on feet  Other abnormalities of gait and mobility  Pain in right ankle and joints of right foot     Problem List Patient Active Problem List   Diagnosis Date Noted  . Encounter for general adult medical examination with abnormal findings 03/12/2020  . Stage 3a chronic kidney disease ([place]) 03/12/2020  . Gastroesophageal reflux disease without esophagitis 02/13/2019  . Screening for cervical cancer 02/13/2019  . Visit for screening mammogram 02/13/2019  . Vitamin D deficiency disease 10/05/2017  . Obesity 06/08/2016  . DDD (degenerative disc disease), lumbar 05/21/2014  .  Essential hypertension 05/21/2014    Glenetta Hew, SPT 04/24/2020, 9:00 AM   During this treatment session, this physical therapist was present, participating in and directing the treatment.   This note has been reviewed and this clinician agrees with the information provided.  Elsie Ra, PT, DPT 04/24/20 9:43 AM  [facility] Physical Therapy [address] [place], [place], [address] Phone: [phone]   Fax:  [phone]  Name: Jamie Jenkins MRN: [id number] Date of Birth: 20-Mar-1960

## 2020-04-26 ENCOUNTER — Ambulatory Visit (INDEPENDENT_AMBULATORY_CARE_PROVIDER_SITE_OTHER): Payer: BC Managed Care – PPO | Admitting: Physical Therapy

## 2020-04-26 ENCOUNTER — Other Ambulatory Visit: Payer: Self-pay

## 2020-04-26 DIAGNOSIS — R2689 Other abnormalities of gait and mobility: Secondary | ICD-10-CM

## 2020-04-26 DIAGNOSIS — M25671 Stiffness of right ankle, not elsewhere classified: Secondary | ICD-10-CM

## 2020-04-26 DIAGNOSIS — R600 Localized edema: Secondary | ICD-10-CM

## 2020-04-26 DIAGNOSIS — M6281 Muscle weakness (generalized): Secondary | ICD-10-CM | POA: Diagnosis not present

## 2020-04-26 DIAGNOSIS — R2681 Unsteadiness on feet: Secondary | ICD-10-CM | POA: Diagnosis not present

## 2020-04-26 DIAGNOSIS — M25571 Pain in right ankle and joints of right foot: Secondary | ICD-10-CM

## 2020-04-26 NOTE — Therapy (Signed)
[facility], [place], [address] Phone: [phone]   Fax:  [phone]  Physical Therapy Treatment  Patient Details  Name: Jamie Jenkins MRN: [id number] Date of Birth: 10-30-1960 Referring Provider (PT): Bevely Palmer Persons, [place]   Encounter Date: 04/26/2020   PT End of Session - 04/26/20 0854    Visit Number 33    Number of Visits 38   updated based on recert on 05/14/6008   Date for PT Re-Evaluation 06/07/20    Authorization Type [employer] Comm PPO    PT Start Time 0803    PT Stop Time 9323    PT Time Calculation (min) 51 min    Activity Tolerance Patient limited by pain;Patient tolerated treatment well    Behavior During Therapy [facility] for tasks assessed/performed           Past Medical History:  Diagnosis Date  . Ankle fracture    right  . DDD (degenerative disc disease), lumbar   . Gall stones   . Hypertension     Past Surgical History:  Procedure Laterality Date  . ANKLE FUSION Left 07/21/2017   Procedure: SUBTALAR AND TALONAVICULAR FUSION LEFT FOOT;  Surgeon: Newt Minion, MD;  Location: [place];  Service: Orthopedics;  Laterality: Left;  . AXILLARY LYMPH NODE DISSECTION Left 11/24/2012   Procedure: incision and drainage axillary abscess;  Surgeon: Adin Hector, MD;  Location: [place];  Service: General;  Laterality: Left;  . BREAST SURGERY  2012   breast biopsy  . CHOLECYSTECTOMY    . ORIF ANKLE FRACTURE Right 11/22/2019   Procedure: OPEN REDUCTION INTERNAL FIXATION (ORIF) RIGHT ANKLE FRACTURE;  Surgeon: Newt Minion, MD;  Location: [place];  Service: Orthopedics;  Laterality: Right;  . TUBAL LIGATION      There were no vitals filed for this visit.   Subjective Assessment - 04/26/20 0806    Subjective Patient reports her ankle is bothering her this morning, little bit more pain than usual.    Pertinent History 11/22/2019 right ankle trimalleolar fx w/ORIF, left subtalar & talonavicular fusion 07/21/17, DDD, HTN     How long can you sit comfortably? 20 minutes    How long can you stand comfortably? 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    Pain Onset More than a month ago                             [facility] Adult PT Treatment/Exercise - 04/26/20 0001      Modalities   Modalities Vasopneumatic      Vasopneumatic   Number Minutes Vasopneumatic  10 minutes    Vasopnuematic Location  Ankle    Vasopneumatic Pressure High    Vasopneumatic Temperature  34      Ankle Exercises: Aerobic   Nustep 8 min L5      Ankle Exercises: Stretches   Soleus Stretch 2 reps;20 seconds    Gastroc Stretch 4 reps;20 seconds      Ankle Exercises: Standing   BAPS Level 3;Standing;10 reps   CCW, CW   SLS 30 secx3    Heel Raises Limitations heel and toe raises 20x    Other Standing Ankle Exercises staggered stance bosu ball mini lunge bilat 2x10, step up and over bosu ball 2x10 each leg, 6'' step up 5x, 4'' step up  15x with ues for focus on knee over toe    Other Standing Ankle Exercises marching with red tband around toes- focus on continual DF 10 each leg; TKE green band for quad strenghtening      Ambulation   Ambulation/Gait Assistance Details --   ramp side stepping for inversion/eversion bias 3 mins                   PT Short Term Goals - 03/26/20 0850      PT SHORT TERM GOAL #1   Title RLE single leg stance >5 sec.    Baseline MET 03/26/2020    Status Achieved    Target Date 03/27/20      PT SHORT TERM GOAL #2   Title Pt ambulates on indoor surfaces without assistive device with proper step width with cues only.    Baseline MET 03/26/2020    Status Achieved    Target Date 04/20/20             PT Long Term Goals - 03/20/20 1106      PT LONG TERM GOAL #1   Title Patient demonstrates & verbalizes understanding of ongoing HEP.    Baseline 1222/2021 Pt  verbalizes understanding of HEP to date but PT continues to progress program as appropriate.    Time 2    Period Months    Status On-going    Target Date 04/20/20      PT LONG TERM GOAL #2   Title FOTO score >/= 60% functional or <40% limited    Baseline 02/20/2020 FOTO  55.2644  improved from 25% at initial evaluation    Time 2    Period Months    Status On-going    Target Date 04/20/20      PT LONG TERM GOAL #3   Title right ankle range within 10* of left ankle range.    Baseline 02/20/2020 see PT assessment tab for seated ankle DF & PF PROM & AROM  improved from eval but not LTG not met    Time 2    Period Months    Status On-going    Target Date 04/20/20      PT LONG TERM GOAL #4   Title Patient ambulates >500' and negotiates ramps, curbs & stairs without device independently.    Baseline can not yet do stairs    Time 2    Period Months    Status On-going    Target Date 04/20/20      PT LONG TERM GOAL #5   Title Right single leg stance >10seconds    Baseline 7 sec at best    Time 2    Period Months    Status On-going    Target Date 04/20/20      PT LONG TERM GOAL #6   Title right ankle & foot pain </= 2/10 with standing & gait activities    Baseline 02/20/2020  Pt reports pain ranges from 4/10 at best for standing / gait activities    Time 2    Period Months    Status On-going    Target Date 04/20/20                 Plan - 04/26/20 0849    Clinical Impression Statement today's session emphasized WB in unstable and differing surfaces than level ground. Patient continues to struggle with eversion and inversion biased WB and stairs over 4''. Continue to address patient quad weakness for increased ability  to manuever larger steps at her home and continue graded exposure to eversion/inversion to patient tolerance.    Personal Factors and Comorbidities Comorbidity 3+    Comorbidities left subtalar & talonavicular fusion 07/21/17, DDD, HTN    Examination-Activity  Limitations Lift;Locomotion Level;Squat;Stairs;Sit;Stand    Examination-Participation Musician;Occupation    Stability/Clinical Decision Making Stable/Uncomplicated    Rehab Potential Good    PT Frequency 2x / week    PT Duration Other (comment)   9 weeks   PT Treatment/Interventions ADLs/Self Care Home Management;Electrical Stimulation;Cryotherapy;DME Instruction;Gait training;Stair training;Contrast Bath;Functional mobility training;Therapeutic activities;Therapeutic exercise;Balance training;Neuromuscular re-education;Patient/family education;Manual techniques;Scar mobilization;Passive range of motion;Dry needling;Vasopneumatic Device;Vestibular;Joint Manipulations    PT Next Visit Plan work with incline treadmill side stepping, stairs, gait  vasopneumatic for decreased swelling    PT Home Exercise Plan Access Code: [email or web address]    Consulted and Agree with Plan of Care Patient           Patient will benefit from skilled therapeutic intervention in order to improve the following deficits and impairments:  Abnormal gait,Decreased balance,Decreased endurance,Decreased mobility,Decreased range of motion,Decreased scar mobility,Decreased strength,Increased edema,Impaired flexibility,Pain  Visit Diagnosis: Stiffness of right ankle, not elsewhere classified  Localized edema  Muscle weakness (generalized)  Unsteadiness on feet  Other abnormalities of gait and mobility  Pain in right ankle and joints of right foot     Problem List Patient Active Problem List   Diagnosis Date Noted  . Encounter for general adult medical examination with abnormal findings 03/12/2020  . Stage 3a chronic kidney disease ([place]) 03/12/2020  . Gastroesophageal reflux disease without esophagitis 02/13/2019  . Screening for cervical cancer 02/13/2019  . Visit for screening mammogram 02/13/2019  . Vitamin D deficiency disease 10/05/2017  . Obesity 06/08/2016  . DDD (degenerative  disc disease), lumbar 05/21/2014  . Essential hypertension 05/21/2014    Glenetta Hew, SPT 04/26/2020, 8:56 AM   During this treatment session, this physical therapist was present, participating in and directing the treatment.   This note has been reviewed and this clinician agrees with the information provided.  Elsie Ra, PT, DPT 04/26/20 10:47 AM   [facility] Physical Therapy [address] [place], [place], [address] Phone: [phone]   Fax:  [phone]  Name: Jamie Jenkins MRN: [id number] Date of Birth: 03/27/1960

## 2020-05-01 ENCOUNTER — Ambulatory Visit (INDEPENDENT_AMBULATORY_CARE_PROVIDER_SITE_OTHER): Payer: BC Managed Care – PPO | Admitting: Physical Therapy

## 2020-05-01 ENCOUNTER — Other Ambulatory Visit: Payer: Self-pay

## 2020-05-01 DIAGNOSIS — M6281 Muscle weakness (generalized): Secondary | ICD-10-CM

## 2020-05-01 DIAGNOSIS — R2681 Unsteadiness on feet: Secondary | ICD-10-CM | POA: Diagnosis not present

## 2020-05-01 DIAGNOSIS — R600 Localized edema: Secondary | ICD-10-CM | POA: Diagnosis not present

## 2020-05-01 DIAGNOSIS — R2689 Other abnormalities of gait and mobility: Secondary | ICD-10-CM

## 2020-05-01 DIAGNOSIS — M25671 Stiffness of right ankle, not elsewhere classified: Secondary | ICD-10-CM | POA: Diagnosis not present

## 2020-05-01 DIAGNOSIS — M25571 Pain in right ankle and joints of right foot: Secondary | ICD-10-CM

## 2020-05-01 NOTE — Therapy (Signed)
[place], [place], [address] Phone: [phone]   Fax:  [phone]  Physical Therapy Treatment  Patient Details  Name: Jamie Jenkins MRN: [id number] Date of Birth: 1961/03/08 Referring Provider (PT): Bevely Palmer Persons, [place]   Encounter Date: 05/01/2020   PT End of Session - 05/01/20 0935    Visit Number 34    Number of Visits 38   updated based on recert on 7/79/3903   Date for PT Re-Evaluation 06/07/20    Authorization Type [employer] Comm PPO    PT Start Time 0843    PT Stop Time 0931    PT Time Calculation (min) 48 min    Activity Tolerance Patient limited by pain;Patient tolerated treatment well    Behavior During Therapy [facility] for tasks assessed/performed           Past Medical History:  Diagnosis Date  . Ankle fracture    right  . DDD (degenerative disc disease), lumbar   . Gall stones   . Hypertension     Past Surgical History:  Procedure Laterality Date  . ANKLE FUSION Left 07/21/2017   Procedure: SUBTALAR AND TALONAVICULAR FUSION LEFT FOOT;  Surgeon: Newt Minion, MD;  Location: [place];  Service: Orthopedics;  Laterality: Left;  . AXILLARY LYMPH NODE DISSECTION Left 11/24/2012   Procedure: incision and drainage axillary abscess;  Surgeon: Adin Hector, MD;  Location: [place];  Service: General;  Laterality: Left;  . BREAST SURGERY  2012   breast biopsy  . CHOLECYSTECTOMY    . ORIF ANKLE FRACTURE Right 11/22/2019   Procedure: OPEN REDUCTION INTERNAL FIXATION (ORIF) RIGHT ANKLE FRACTURE;  Surgeon: Newt Minion, MD;  Location: [place];  Service: Orthopedics;  Laterality: Right;  . TUBAL LIGATION      There were no vitals filed for this visit.   Subjective Assessment - 05/01/20 0846    Subjective Patient is feeling alot better since last visit, small amount of pain maybe a 2 or a 3/10.    Pertinent History 11/22/2019 right ankle trimalleolar fx w/ORIF, left subtalar & talonavicular fusion 07/21/17, DDD, HTN     How long can you sit comfortably? 20 minutes    How long can you stand comfortably? 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 Yes    Pain Location Ankle    Pain Orientation Right    Pain Type Chronic pain    Pain Onset More than a month ago            [facility] Adult PT Treatment/Exercise - 05/01/20 0001      Ambulation/Gait   Ambulation/Gait Yes    Ambulation/Gait Assistance 5: Supervision    Ramp 5: Supervision    Gait Comments Treadmill walking .5 speed 1.0 incline 2 mins, .1 speed 4.0 incline -focus on heel off/toe off phase; Lateral ramp walking for inversion/eversoin focus      Modalities   Modalities Vasopneumatic      Vasopneumatic   Number Minutes Vasopneumatic  10 minutes    Vasopnuematic Location  Ankle    Vasopneumatic Pressure High    Vasopneumatic Temperature  34      Ankle Exercises: Aerobic   Tread Mill 1.0 incline, .5 speed 2 mins, 4.0 incline .1 speed 2 mins    Nustep 8 min L5      Ankle Exercises:  Stretches   Soleus Stretch 2 reps;20 seconds    Gastroc Stretch 4 reps;20 seconds      Ankle Exercises: Standing   Rocker Board 5 minutes   AP/ML balance, AP/ML taps 15xeach side, R only M/L tapping 15x   Other Standing Ankle Exercises step up and over airex pad 10x, step up 4'' no UE 15x, step up 6'' 10x, TKE green band 15x                    PT Short Term Goals - 03/26/20 0850      PT SHORT TERM GOAL #1   Title RLE single leg stance >5 sec.    Baseline MET 03/26/2020    Status Achieved    Target Date 03/27/20      PT SHORT TERM GOAL #2   Title Pt ambulates on indoor surfaces without assistive device with proper step width with cues only.    Baseline MET 03/26/2020    Status Achieved    Target Date 04/20/20             PT Long Term Goals - 05/01/20 1010      PT LONG TERM GOAL #1   Title Patient  demonstrates & verbalizes understanding of ongoing HEP.    Baseline 1222/2021 Pt verbalizes understanding of HEP to date but PT continues to progress program as appropriate.    Time 2    Period Months    Status On-going      PT LONG TERM GOAL #2   Title FOTO score >/= 60% functional or <40% limited    Baseline 02/20/2020 FOTO  55.2644  improved from 25% at initial evaluation    Time 2    Period Months    Status On-going      PT LONG TERM GOAL #3   Title right ankle range within 10* of left ankle range.    Baseline 02/20/2020 see PT assessment tab for seated ankle DF & PF PROM & AROM  improved from eval but not LTG not met    Time 2    Period Months    Status On-going      PT LONG TERM GOAL #4   Title Patient ambulates >500' and negotiates ramps, curbs & stairs without device independently.    Baseline able to do 4'' step and can tolerate 6''    Time 2    Period Months    Status On-going      PT LONG TERM GOAL #5   Title Right single leg stance >10seconds    Baseline 7 sec at best    Time 2    Period Months    Status On-going      PT LONG TERM GOAL #6   Title right ankle & foot pain </= 2/10 with standing & gait activities    Baseline 02/20/2020  Pt reports pain ranges from 4/10 at best for standing / gait activities    Time 2    Period Months    Status On-going                 Plan - 05/01/20 0939    Clinical Impression Statement Patient is making good progress each visit, Today she was able to tolerate more WB with incline surfaces. She is working on pain control with heel off phase of gait and inversion/eversion bias. She was able to take 6'' step with good body mechanics, continue to progress this for normal gait to meet  LTGs.    Personal Factors and Comorbidities Comorbidity 3+    Comorbidities left subtalar & talonavicular fusion 07/21/17, DDD, HTN    Examination-Activity Limitations Lift;Locomotion Level;Squat;Stairs;Sit;Stand    Examination-Participation  Musician;Occupation    Stability/Clinical Decision Making Stable/Uncomplicated    Rehab Potential Good    PT Frequency 2x / week    PT Duration Other (comment)   9 weeks   PT Treatment/Interventions ADLs/Self Care Home Management;Electrical Stimulation;Cryotherapy;DME Instruction;Gait training;Stair training;Contrast Bath;Functional mobility training;Therapeutic activities;Therapeutic exercise;Balance training;Neuromuscular re-education;Patient/family education;Manual techniques;Scar mobilization;Passive range of motion;Dry needling;Vasopneumatic Device;Vestibular;Joint Manipulations    PT Next Visit Plan work with incline treadmill side stepping, stair progression, vaso for decreased swelling/pain relief    PT Home Exercise Plan Access Code: [email or web address]    Consulted and Agree with Plan of Care Patient           Patient will benefit from skilled therapeutic intervention in order to improve the following deficits and impairments:  Abnormal gait,Decreased balance,Decreased endurance,Decreased mobility,Decreased range of motion,Decreased scar mobility,Decreased strength,Increased edema,Impaired flexibility,Pain  Visit Diagnosis: Localized edema  Stiffness of right ankle, not elsewhere classified  Muscle weakness (generalized)  Unsteadiness on feet  Other abnormalities of gait and mobility  Pain in right ankle and joints of right foot     Problem List Patient Active Problem List   Diagnosis Date Noted  . Encounter for general adult medical examination with abnormal findings 03/12/2020  . Stage 3a chronic kidney disease ([place]) 03/12/2020  . Gastroesophageal reflux disease without esophagitis 02/13/2019  . Screening for cervical cancer 02/13/2019  . Visit for screening mammogram 02/13/2019  . Vitamin D deficiency disease 10/05/2017  . Obesity 06/08/2016  . DDD (degenerative disc disease), lumbar 05/21/2014  . Essential hypertension 05/21/2014     Glenetta Hew, SPT 05/01/2020, 10:12 AM   During this treatment session, this physical therapist was present, participating in and directing the treatment.   This note has been reviewed and this clinician agrees with the information provided.  Elsie Ra, PT, DPT 05/01/20 10:23 AM   [facility] [address] [place], [place], [address] Phone: [phone]   Fax:  [phone]  Name: Jamie Jenkins MRN: [id number] Date of Birth: 1960-05-01

## 2020-05-03 ENCOUNTER — Other Ambulatory Visit: Payer: Self-pay

## 2020-05-03 ENCOUNTER — Ambulatory Visit (INDEPENDENT_AMBULATORY_CARE_PROVIDER_SITE_OTHER): Payer: BC Managed Care – PPO | Admitting: Physical Therapy

## 2020-05-03 DIAGNOSIS — R2689 Other abnormalities of gait and mobility: Secondary | ICD-10-CM

## 2020-05-03 DIAGNOSIS — M6281 Muscle weakness (generalized): Secondary | ICD-10-CM

## 2020-05-03 DIAGNOSIS — M25571 Pain in right ankle and joints of right foot: Secondary | ICD-10-CM

## 2020-05-03 DIAGNOSIS — R600 Localized edema: Secondary | ICD-10-CM | POA: Diagnosis not present

## 2020-05-03 DIAGNOSIS — R2681 Unsteadiness on feet: Secondary | ICD-10-CM

## 2020-05-03 DIAGNOSIS — M25671 Stiffness of right ankle, not elsewhere classified: Secondary | ICD-10-CM | POA: Diagnosis not present

## 2020-05-03 NOTE — Therapy (Signed)
[place], [place], [address] Phone: [phone]   Fax:  [phone]  Physical Therapy Treatment  Patient Details  Name: Jamie Jenkins MRN: [id number] Date of Birth: May 14, 1960 Referring Provider (PT): Bevely Palmer Persons, [place]   Encounter Date: 05/03/2020   PT End of Session - 05/03/20 0845    Visit Number 35    Number of Visits 38   updated based on recert on 4/62/7035   Date for PT Re-Evaluation 06/07/20    Authorization Type [employer] Comm PPO    PT Start Time 0800    PT Stop Time 0848    PT Time Calculation (min) 48 min    Activity Tolerance Patient limited by pain;Patient tolerated treatment well    Behavior During Therapy [facility] for tasks assessed/performed           Past Medical History:  Diagnosis Date  . Ankle fracture    right  . DDD (degenerative disc disease), lumbar   . Gall stones   . Hypertension     Past Surgical History:  Procedure Laterality Date  . ANKLE FUSION Left 07/21/2017   Procedure: SUBTALAR AND TALONAVICULAR FUSION LEFT FOOT;  Surgeon: Newt Minion, MD;  Location: [place];  Service: Orthopedics;  Laterality: Left;  . AXILLARY LYMPH NODE DISSECTION Left 11/24/2012   Procedure: incision and drainage axillary abscess;  Surgeon: Adin Hector, MD;  Location: [place];  Service: General;  Laterality: Left;  . BREAST SURGERY  2012   breast biopsy  . CHOLECYSTECTOMY    . ORIF ANKLE FRACTURE Right 11/22/2019   Procedure: OPEN REDUCTION INTERNAL FIXATION (ORIF) RIGHT ANKLE FRACTURE;  Surgeon: Newt Minion, MD;  Location: [place];  Service: Orthopedics;  Laterality: Right;  . TUBAL LIGATION      There were no vitals filed for this visit.   Subjective Assessment - 05/03/20 0844    Subjective Patient reports 3/10 pain today, she did have to take meds last night for the pain.    Pertinent History 11/22/2019 right ankle trimalleolar fx w/ORIF, left subtalar & talonavicular fusion 07/21/17, DDD, HTN    How  long can you sit comfortably? 20 minutes    How long can you stand comfortably? 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 Yes    Pain Score 3     Pain Location Ankle    Pain Orientation Right    Pain Onset More than a month ago             [facility] Adult PT Treatment/Exercise - 05/03/20 0001      Ambulation/Gait   Ambulation/Gait Yes    Ambulation/Gait Assistance 5: Supervision    Ramp 5: Supervision    Ramp Details (indicate cue type and reason) side stepping to L to bias inversion 3 feet x10, side stepping to R to bias eversion 3 feet x5      Vasopneumatic   Number Minutes Vasopneumatic  10 minutes    Vasopnuematic Location  Ankle    Vasopneumatic Pressure High    Vasopneumatic Temperature  34      Ankle Exercises: Aerobic   Nustep 8 min L5      Ankle Exercises: Standing   SLS 30 secx3 on airex, SLS on RL with cone tapping 20x UE prn    Rocker Board 4 minutes   A/P  tapping with DF 5 sec stretch 2x20, A/P balance 30 sec x2   Other Standing Ankle Exercises step up on airex pad on 4'' step 20x, step up 4'' no UE 11x, step up 6'' 5x    Other Standing Ankle Exercises tandem walking on foam 3 laps with UE prn, side stepping on foam 3 laps, weight shifting with R foot on 2'' step 10x, weight shifitng focus on heel off 10x                    PT Short Term Goals - 03/26/20 0850      PT SHORT TERM GOAL #1   Title RLE single leg stance >5 sec.    Baseline MET 03/26/2020    Status Achieved    Target Date 03/27/20      PT SHORT TERM GOAL #2   Title Pt ambulates on indoor surfaces without assistive device with proper step width with cues only.    Baseline MET 03/26/2020    Status Achieved    Target Date 04/20/20             PT Long Term Goals - 05/01/20 1010      PT LONG TERM GOAL #1   Title Patient demonstrates & verbalizes  understanding of ongoing HEP.    Baseline 1222/2021 Pt verbalizes understanding of HEP to date but PT continues to progress program as appropriate.    Time 2    Period Months    Status On-going      PT LONG TERM GOAL #2   Title FOTO score >/= 60% functional or <40% limited    Baseline 02/20/2020 FOTO  55.2644  improved from 25% at initial evaluation    Time 2    Period Months    Status On-going      PT LONG TERM GOAL #3   Title right ankle range within 10* of left ankle range.    Baseline 02/20/2020 see PT assessment tab for seated ankle DF & PF PROM & AROM  improved from eval but not LTG not met    Time 2    Period Months    Status On-going      PT LONG TERM GOAL #4   Title Patient ambulates >500' and negotiates ramps, curbs & stairs without device independently.    Baseline able to do 4'' step and can tolerate 6''    Time 2    Period Months    Status On-going      PT LONG TERM GOAL #5   Title Right single leg stance >10seconds    Baseline 7 sec at best    Time 2    Period Months    Status On-going      PT LONG TERM GOAL #6   Title right ankle & foot pain </= 2/10 with standing & gait activities    Baseline 02/20/2020  Pt reports pain ranges from 4/10 at best for standing / gait activities    Time 2    Period Months    Status On-going                 Plan - 05/03/20 0846    Clinical Impression Statement Patient continues to make progress with her tolerance to standing activity. Main focus will be decreasing pain and sensitivity to heel off phase of walking for normalized gait and better navigation of stairs gradually. Jamie Jenkins can benefit from graded exposure to different surfaces/ levels to strengthen her ankle  stability in a controlled environment to better prepare her for unpredictable community ambulation.    Personal Factors and Comorbidities Comorbidity 3+    Comorbidities left subtalar & talonavicular fusion 07/21/17, DDD, HTN    Examination-Activity Limitations  Lift;Locomotion Level;Squat;Stairs;Sit;Stand    Examination-Participation Musician;Occupation    Stability/Clinical Decision Making Stable/Uncomplicated    Rehab Potential Good    PT Frequency 2x / week    PT Duration Other (comment)   9 weeks   PT Treatment/Interventions ADLs/Self Care Home Management;Electrical Stimulation;Cryotherapy;DME Instruction;Gait training;Stair training;Contrast Bath;Functional mobility training;Therapeutic activities;Therapeutic exercise;Balance training;Neuromuscular re-education;Patient/family education;Manual techniques;Scar mobilization;Passive range of motion;Dry needling;Vasopneumatic Device;Vestibular;Joint Manipulations    PT Next Visit Plan work with incline treadmill side stepping, stair progression/heel off/PF stretch , vaso for decreased swelling/pain relief    PT Home Exercise Plan Access Code: [email or web address]    Consulted and Agree with Plan of Care Patient           Patient will benefit from skilled therapeutic intervention in order to improve the following deficits and impairments:  Abnormal gait,Decreased balance,Decreased endurance,Decreased mobility,Decreased range of motion,Decreased scar mobility,Decreased strength,Increased edema,Impaired flexibility,Pain  Visit Diagnosis: Localized edema  Stiffness of right ankle, not elsewhere classified  Muscle weakness (generalized)  Unsteadiness on feet  Other abnormalities of gait and mobility  Pain in right ankle and joints of right foot     Problem List Patient Active Problem List   Diagnosis Date Noted  . Encounter for general adult medical examination with abnormal findings 03/12/2020  . Stage 3a chronic kidney disease ([place]) 03/12/2020  . Gastroesophageal reflux disease without esophagitis 02/13/2019  . Screening for cervical cancer 02/13/2019  . Visit for screening mammogram 02/13/2019  . Vitamin D deficiency disease 10/05/2017  . Obesity 06/08/2016  . DDD  (degenerative disc disease), lumbar 05/21/2014  . Essential hypertension 05/21/2014    Jamie Jenkins, SPT 05/03/2020, 8:55 AM   During this treatment session, this physical therapist was present, participating in and directing the treatment.   This note has been reviewed and this clinician agrees with the information provided.  Jamie Jenkins, PT, DPT 05/03/20 10:22 AM   [facility] [address] [place], [place], [address] Phone: [phone]   Fax:  [phone]  Name: Jamie Jenkins MRN: [id number] Date of Birth: 1961/01/31

## 2020-05-08 ENCOUNTER — Ambulatory Visit (INDEPENDENT_AMBULATORY_CARE_PROVIDER_SITE_OTHER): Payer: BC Managed Care – PPO | Admitting: Physical Therapy

## 2020-05-08 ENCOUNTER — Other Ambulatory Visit: Payer: Self-pay

## 2020-05-08 DIAGNOSIS — R600 Localized edema: Secondary | ICD-10-CM

## 2020-05-08 DIAGNOSIS — R2681 Unsteadiness on feet: Secondary | ICD-10-CM

## 2020-05-08 DIAGNOSIS — M25671 Stiffness of right ankle, not elsewhere classified: Secondary | ICD-10-CM

## 2020-05-08 DIAGNOSIS — R2689 Other abnormalities of gait and mobility: Secondary | ICD-10-CM

## 2020-05-08 DIAGNOSIS — M6281 Muscle weakness (generalized): Secondary | ICD-10-CM | POA: Diagnosis not present

## 2020-05-08 DIAGNOSIS — M25571 Pain in right ankle and joints of right foot: Secondary | ICD-10-CM

## 2020-05-08 NOTE — Therapy (Signed)
[place], [place], [address] Phone: [phone]   Fax:  [phone]  Physical Therapy Treatment  Patient Details  Name: Jamie Jenkins MRN: [id number] Date of Birth: 26-Nov-1960 Referring Provider (PT): Bevely Palmer Persons, [place]   Encounter Date: 05/08/2020   PT End of Session - 05/08/20 0924    Visit Number 36    Number of Visits 38   updated based on recert on 0/71/2197   Date for PT Re-Evaluation 06/07/20    Authorization Type BCBS Comm PPO    PT Start Time 5883    PT Stop Time 0932    PT Time Calculation (min) 45 min    Activity Tolerance Patient limited by pain;Patient tolerated treatment well    Behavior During Therapy [facility] for tasks assessed/performed           Past Medical History:  Diagnosis Date  . Ankle fracture    right  . DDD (degenerative disc disease), lumbar   . Gall stones   . Hypertension     Past Surgical History:  Procedure Laterality Date  . ANKLE FUSION Left 07/21/2017   Procedure: SUBTALAR AND TALONAVICULAR FUSION LEFT FOOT;  Surgeon: Newt Minion, MD;  Location: [place];  Service: Orthopedics;  Laterality: Left;  . AXILLARY LYMPH NODE DISSECTION Left 11/24/2012   Procedure: incision and drainage axillary abscess;  Surgeon: Adin Hector, MD;  Location: [place];  Service: General;  Laterality: Left;  . BREAST SURGERY  2012   breast biopsy  . CHOLECYSTECTOMY    . ORIF ANKLE FRACTURE Right 11/22/2019   Procedure: OPEN REDUCTION INTERNAL FIXATION (ORIF) RIGHT ANKLE FRACTURE;  Surgeon: Newt Minion, MD;  Location: [place];  Service: Orthopedics;  Laterality: Right;  . TUBAL LIGATION      There were no vitals filed for this visit.   Subjective Assessment - 05/08/20 0855    Subjective Patient reports pain has been overall pretty good the past few days.    Pertinent History 11/22/2019 right ankle trimalleolar fx w/ORIF, left subtalar & talonavicular fusion 07/21/17, DDD, HTN    How long can you  sit comfortably? 20 minutes    How long can you stand comfortably? 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    Pain Onset More than a month ago           [facility] Adult PT Treatment/Exercise - 05/08/20 0001      Modalities   Modalities Vasopneumatic      Vasopneumatic   Number Minutes Vasopneumatic  10 minutes    Vasopnuematic Location  Ankle    Vasopneumatic Pressure High    Vasopneumatic Temperature  34      Ankle Exercises: Aerobic   Nustep 4 min L5, 4 min L7      Ankle Exercises: Standing   BAPS Level 3;Standing;10 reps   CW, CCW   SLS 30 secx3 on airex, SLS on RL with cone tapping 20x UE prn    Rocker Board 4 minutes   A/P tapping 20x   Heel Raises 20 reps   with ball squeeze   Toe Raise 20 reps    Other Standing Ankle Exercises 4'' step up 10x, 6'' step up 15x, 2'' step up and step down 15x                    PT Short  Term Goals - 03/26/20 0850      PT SHORT TERM GOAL #1   Title RLE single leg stance >5 sec.    Baseline MET 03/26/2020    Status Achieved    Target Date 03/27/20      PT SHORT TERM GOAL #2   Title Pt ambulates on indoor surfaces without assistive device with proper step width with cues only.    Baseline MET 03/26/2020    Status Achieved    Target Date 04/20/20             PT Long Term Goals - 05/01/20 1010      PT LONG TERM GOAL #1   Title Patient demonstrates & verbalizes understanding of ongoing HEP.    Baseline 1222/2021 Pt verbalizes understanding of HEP to date but PT continues to progress program as appropriate.    Time 2    Period Months    Status On-going      PT LONG TERM GOAL #2   Title FOTO score >/= 60% functional or <40% limited    Baseline 02/20/2020 FOTO  55.2644  improved from 25% at initial evaluation    Time 2    Period Months    Status On-going      PT LONG TERM GOAL #3   Title right ankle range  within 10* of left ankle range.    Baseline 02/20/2020 see PT assessment tab for seated ankle DF & PF PROM & AROM  improved from eval but not LTG not met    Time 2    Period Months    Status On-going      PT LONG TERM GOAL #4   Title Patient ambulates >500' and negotiates ramps, curbs & stairs without device independently.    Baseline able to do 4'' step and can tolerate 6''    Time 2    Period Months    Status On-going      PT LONG TERM GOAL #5   Title Right single leg stance >10seconds    Baseline 7 sec at best    Time 2    Period Months    Status On-going      PT LONG TERM GOAL #6   Title right ankle & foot pain </= 2/10 with standing & gait activities    Baseline 02/20/2020  Pt reports pain ranges from 4/10 at best for standing / gait activities    Time 2    Period Months    Status On-going                 Plan - 05/08/20 2330    Clinical Impression Statement Patient was able to tolerate 2'' step down better than when previously tried. Continue to build up patient tolerance to heel off phase for more controlled gait. Patient is making good progress overall but continues to have issues with pain control with WB.    Personal Factors and Comorbidities Comorbidity 3+    Comorbidities left subtalar & talonavicular fusion 07/21/17, DDD, HTN    Examination-Activity Limitations Lift;Locomotion Level;Squat;Stairs;Sit;Stand    Examination-Participation Musician;Occupation    Stability/Clinical Decision Making Stable/Uncomplicated    Rehab Potential Good    PT Frequency 2x / week    PT Duration Other (comment)   9 weeks   PT Treatment/Interventions ADLs/Self Care Home Management;Electrical Stimulation;Cryotherapy;DME Instruction;Gait training;Stair training;Contrast Bath;Functional mobility training;Therapeutic activities;Therapeutic exercise;Balance training;Neuromuscular re-education;Patient/family education;Manual techniques;Scar  mobilization;Passive range of motion;Dry needling;Vasopneumatic Device;Vestibular;Joint Manipulations    PT Next  Visit Plan work with incline treadmill side stepping, stair progression/heel off/PF stretch , vaso for decreased swelling/pain relief    PT Home Exercise Plan Access Code: [email or web address]    Consulted and Agree with Plan of Care Patient           Patient will benefit from skilled therapeutic intervention in order to improve the following deficits and impairments:  Abnormal gait,Decreased balance,Decreased endurance,Decreased mobility,Decreased range of motion,Decreased scar mobility,Decreased strength,Increased edema,Impaired flexibility,Pain  Visit Diagnosis: Stiffness of right ankle, not elsewhere classified  Muscle weakness (generalized)  Localized edema  Unsteadiness on feet  Other abnormalities of gait and mobility  Pain in right ankle and joints of right foot     Problem List Patient Active Problem List   Diagnosis Date Noted  . Encounter for general adult medical examination with abnormal findings 03/12/2020  . Stage 3a chronic kidney disease ([place]) 03/12/2020  . Gastroesophageal reflux disease without esophagitis 02/13/2019  . Screening for cervical cancer 02/13/2019  . Visit for screening mammogram 02/13/2019  . Vitamin D deficiency disease 10/05/2017  . Obesity 06/08/2016  . DDD (degenerative disc disease), lumbar 05/21/2014  . Essential hypertension 05/21/2014    Glenetta Hew, SPT 05/08/2020, 9:35 AM   During this treatment session, this physical therapist was present, participating in and directing the treatment.   This note has been reviewed and this clinician agrees with the information provided.  Elsie Ra, PT, DPT 05/08/20 10:02 AM   [facility] Therapy [address] [place], [place], [address] Phone: [phone]   Fax:  [phone]  Name: Jessicia Napolitano MRN: [id number] Date of Birth: 06-Feb-1961

## 2020-05-10 ENCOUNTER — Encounter: Payer: Self-pay | Admitting: Physical Therapy

## 2020-05-10 ENCOUNTER — Other Ambulatory Visit: Payer: Self-pay

## 2020-05-10 ENCOUNTER — Ambulatory Visit (INDEPENDENT_AMBULATORY_CARE_PROVIDER_SITE_OTHER): Payer: BC Managed Care – PPO | Admitting: Physical Therapy

## 2020-05-10 DIAGNOSIS — M25671 Stiffness of right ankle, not elsewhere classified: Secondary | ICD-10-CM

## 2020-05-10 DIAGNOSIS — M6281 Muscle weakness (generalized): Secondary | ICD-10-CM | POA: Diagnosis not present

## 2020-05-10 DIAGNOSIS — M25571 Pain in right ankle and joints of right foot: Secondary | ICD-10-CM

## 2020-05-10 DIAGNOSIS — R2681 Unsteadiness on feet: Secondary | ICD-10-CM

## 2020-05-10 DIAGNOSIS — R600 Localized edema: Secondary | ICD-10-CM

## 2020-05-10 DIAGNOSIS — R2689 Other abnormalities of gait and mobility: Secondary | ICD-10-CM

## 2020-05-10 NOTE — Therapy (Signed)
[place], [place], [address] Phone: [phone]   Fax:  [phone]  Physical Therapy Treatment  Patient Details  Name: Jamie Jenkins MRN: [id number] Date of Birth: 21-Dec-1960 Referring Provider (PT): Bevely Palmer Persons, [place]   Encounter Date: 05/10/2020   PT End of Session - 05/10/20 0856    Visit Number 37    Number of Visits 38   updated based on recert on 0/71/2197   Date for PT Re-Evaluation 06/07/20    Authorization Type [employer] PPO    PT Start Time 0800    PT Stop Time 0846    PT Time Calculation (min) 46 min    Activity Tolerance Patient limited by pain;Patient tolerated treatment well    Behavior During Therapy [facility] for tasks assessed/performed           Past Medical History:  Diagnosis Date  . Ankle fracture    right  . DDD (degenerative disc disease), lumbar   . Gall stones   . Hypertension     Past Surgical History:  Procedure Laterality Date  . ANKLE FUSION Left 07/21/2017   Procedure: SUBTALAR AND TALONAVICULAR FUSION LEFT FOOT;  Surgeon: Newt Minion, MD;  Location: [place];  Service: Orthopedics;  Laterality: Left;  . AXILLARY LYMPH NODE DISSECTION Left 11/24/2012   Procedure: incision and drainage axillary abscess;  Surgeon: Adin Hector, MD;  Location: [place];  Service: General;  Laterality: Left;  . BREAST SURGERY  2012   breast biopsy  . CHOLECYSTECTOMY    . ORIF ANKLE FRACTURE Right 11/22/2019   Procedure: OPEN REDUCTION INTERNAL FIXATION (ORIF) RIGHT ANKLE FRACTURE;  Surgeon: Newt Minion, MD;  Location: [place];  Service: Orthopedics;  Laterality: Right;  . TUBAL LIGATION      There were no vitals filed for this visit.   Subjective Assessment - 05/10/20 0812    Subjective Patient reports 4/10 Rt ankle pain today and pain is more medial.    Pertinent History 11/22/2019 right ankle trimalleolar fx w/ORIF, left subtalar & talonavicular fusion 07/21/17, DDD, HTN    How long can you sit  comfortably? 20 minutes    How long can you stand comfortably? 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    Pain Onset More than a month ago                             [facility] Adult PT Treatment/Exercise - 05/10/20 0001      Vasopneumatic   Number Minutes Vasopneumatic  10 minutes    Vasopnuematic Location  Ankle    Vasopneumatic Pressure High    Vasopneumatic Temperature  34      Manual Therapy   Manual therapy comments Rt ankle PROM, distraction and DF mobs      Ankle Exercises: Stretches   Slant Board Stretch 3 reps;30 seconds    Other Stretch MWM with green strap on slant board for increased DF 10 sec x10      Ankle Exercises: Aerobic   Nustep L5 X 9 min      Ankle Exercises: Standing   Rocker Board --   fitter board X 15 reps latreal, A-P, circles   Heel Raises 20 reps   with ball sq   Toe Raise 20 reps    Other Standing Ankle Exercises  up/down ramp 5 times fwd, 3 times latreral ea direction    Other Standing Ankle Exercises toe walking with heavy UE support on counter, 2 trips                    PT Short Term Goals - 03/26/20 0850      PT SHORT TERM GOAL #1   Title RLE single leg stance >5 sec.    Baseline MET 03/26/2020    Status Achieved    Target Date 03/27/20      PT SHORT TERM GOAL #2   Title Pt ambulates on indoor surfaces without assistive device with proper step width with cues only.    Baseline MET 03/26/2020    Status Achieved    Target Date 04/20/20             PT Long Term Goals - 05/01/20 1010      PT LONG TERM GOAL #1   Title Patient demonstrates & verbalizes understanding of ongoing HEP.    Baseline 1222/2021 Pt verbalizes understanding of HEP to date but PT continues to progress program as appropriate.    Time 2    Period Months    Status On-going      PT LONG TERM GOAL #2   Title FOTO score >/=  60% functional or <40% limited    Baseline 02/20/2020 FOTO  55.2644  improved from 25% at initial evaluation    Time 2    Period Months    Status On-going      PT LONG TERM GOAL #3   Title right ankle range within 10* of left ankle range.    Baseline 02/20/2020 see PT assessment tab for seated ankle DF & PF PROM & AROM  improved from eval but not LTG not met    Time 2    Period Months    Status On-going      PT LONG TERM GOAL #4   Title Patient ambulates >500' and negotiates ramps, curbs & stairs without device independently.    Baseline able to do 4'' step and can tolerate 6''    Time 2    Period Months    Status On-going      PT LONG TERM GOAL #5   Title Right single leg stance >10seconds    Baseline 7 sec at best    Time 2    Period Months    Status On-going      PT LONG TERM GOAL #6   Title right ankle & foot pain </= 2/10 with standing & gait activities    Baseline 02/20/2020  Pt reports pain ranges from 4/10 at best for standing / gait activities    Time 2    Period Months    Status On-going                 Plan - 05/10/20 5732    Clinical Impression Statement Continued to focus on Rt ankle ROM and strength as tolerated. She was having more medial ankle pain overall today. She will need recert next visit.    Personal Factors and Comorbidities Comorbidity 3+    Comorbidities left subtalar & talonavicular fusion 07/21/17, DDD, HTN    Examination-Activity Limitations Lift;Locomotion Level;Squat;Stairs;Sit;Stand    Examination-Participation Musician;Occupation    Stability/Clinical Decision Making Stable/Uncomplicated    Rehab Potential Good    PT Frequency 2x / week    PT Duration Other (comment)   9 weeks   PT  Treatment/Interventions ADLs/Self Care Home Management;Electrical Stimulation;Cryotherapy;DME Instruction;Gait training;Stair training;Contrast Bath;Functional mobility training;Therapeutic activities;Therapeutic exercise;Balance  training;Neuromuscular re-education;Patient/family education;Manual techniques;Scar mobilization;Passive range of motion;Dry needling;Vasopneumatic Device;Vestibular;Joint Manipulations    PT Next Visit Plan recert!!! work with incline treadmill side stepping, stair progression/heel off/PF stretch , vaso for decreased swelling/pain relief    PT Home Exercise Plan Access Code: [email or web address]    Consulted and Agree with Plan of Care Patient           Patient will benefit from skilled therapeutic intervention in order to improve the following deficits and impairments:  Abnormal gait,Decreased balance,Decreased endurance,Decreased mobility,Decreased range of motion,Decreased scar mobility,Decreased strength,Increased edema,Impaired flexibility,Pain  Visit Diagnosis: Stiffness of right ankle, not elsewhere classified  Muscle weakness (generalized)  Localized edema  Unsteadiness on feet  Other abnormalities of gait and mobility  Pain in right ankle and joints of right foot     Problem List Patient Active Problem List   Diagnosis Date Noted  . Encounter for general adult medical examination with abnormal findings 03/12/2020  . Stage 3a chronic kidney disease ([place]) 03/12/2020  . Gastroesophageal reflux disease without esophagitis 02/13/2019  . Screening for cervical cancer 02/13/2019  . Visit for screening mammogram 02/13/2019  . Vitamin D deficiency disease 10/05/2017  . Obesity 06/08/2016  . DDD (degenerative disc disease), lumbar 05/21/2014  . Essential hypertension 05/21/2014    Debbe Odea, PT,DPT 05/10/2020, 8:58 AM  [facility] Physical Therapy [address] [place], [place], [address] Phone: [phone]   Fax:  [phone]  Name: Jamie Jenkins MRN: [id number] Date of Birth: April 28, 1960

## 2020-05-15 ENCOUNTER — Ambulatory Visit (INDEPENDENT_AMBULATORY_CARE_PROVIDER_SITE_OTHER): Payer: BC Managed Care – PPO | Admitting: Physical Therapy

## 2020-05-15 ENCOUNTER — Other Ambulatory Visit: Payer: Self-pay

## 2020-05-15 ENCOUNTER — Encounter: Payer: Self-pay | Admitting: Physical Therapy

## 2020-05-15 DIAGNOSIS — M6281 Muscle weakness (generalized): Secondary | ICD-10-CM | POA: Diagnosis not present

## 2020-05-15 DIAGNOSIS — R600 Localized edema: Secondary | ICD-10-CM | POA: Diagnosis not present

## 2020-05-15 DIAGNOSIS — R2681 Unsteadiness on feet: Secondary | ICD-10-CM

## 2020-05-15 DIAGNOSIS — R2689 Other abnormalities of gait and mobility: Secondary | ICD-10-CM

## 2020-05-15 DIAGNOSIS — M25671 Stiffness of right ankle, not elsewhere classified: Secondary | ICD-10-CM | POA: Diagnosis not present

## 2020-05-15 DIAGNOSIS — M25571 Pain in right ankle and joints of right foot: Secondary | ICD-10-CM

## 2020-05-15 NOTE — Therapy (Signed)
[facility] Physical Therapy [address] [place], [place], [address] Phone: [phone]   Fax:  [phone]  Physical Therapy Treatment/Progress note/recert Progress Note reporting period 04/04/2020 to 05/15/2020  See below for objective and subjective measurements relating to patients progress with PT.  Patient Details  Name: Jamie Jenkins MRN: [id number] Date of Birth: Oct 03, 1960 Referring Provider (PT): Bevely Palmer Persons, [place]   Encounter Date: 05/15/2020   PT End of Session - 05/15/20 0807    Visit Number 38    Number of Visits 50   updated based on recert on 3/79/0240   Date for PT Re-Evaluation 06/26/20    Authorization Type [employer] Comm PPO    Progress Note Due on Visit 59    PT Start Time 0801    PT Stop Time 0849    PT Time Calculation (min) 48 min    Activity Tolerance Patient limited by pain;Patient tolerated treatment well    Behavior During Therapy [facility] for tasks assessed/performed           Past Medical History:  Diagnosis Date  . Ankle fracture    right  . DDD (degenerative disc disease), lumbar   . Gall stones   . Hypertension     Past Surgical History:  Procedure Laterality Date  . ANKLE FUSION Left 07/21/2017   Procedure: SUBTALAR AND TALONAVICULAR FUSION LEFT FOOT;  Surgeon: Newt Minion, MD;  Location: [place];  Service: Orthopedics;  Laterality: Left;  . AXILLARY LYMPH NODE DISSECTION Left 11/24/2012   Procedure: incision and drainage axillary abscess;  Surgeon: Adin Hector, MD;  Location: [place];  Service: General;  Laterality: Left;  . BREAST SURGERY  2012   breast biopsy  . CHOLECYSTECTOMY    . ORIF ANKLE FRACTURE Right 11/22/2019   Procedure: OPEN REDUCTION INTERNAL FIXATION (ORIF) RIGHT ANKLE FRACTURE;  Surgeon: Newt Minion, MD;  Location: [place];  Service: Orthopedics;  Laterality: Right;  . TUBAL LIGATION      There were no vitals filed for this visit.   Subjective Assessment - 05/15/20 0804    Subjective Patient  reports pain is 3/10 and has been working on her walking.    Pertinent History 11/22/2019 right ankle trimalleolar fx w/ORIF, left subtalar & talonavicular fusion 07/21/17, DDD, HTN    How long can you sit comfortably? 20 minutes    How long can you stand comfortably? 45 minutes before having to take a break    How long can you walk comfortably? 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 Yes    Pain Score 3     Pain Location Ankle    Pain Orientation Right    Pain Descriptors / Indicators Sharp    Pain Type Chronic pain    Pain Onset More than a month ago              [facility] PT Assessment - 05/15/20 0001      Observation/Other Assessments   Focus on Therapeutic Outcomes (FOTO)  59%      AROM   Right Ankle Dorsiflexion 5    Right Ankle Plantar Flexion 47    Right Ankle Inversion 14    Right Ankle Eversion 19      Strength   Right Ankle Dorsiflexion 5/5    Right Ankle Plantar  Flexion 4+/5    Right Ankle Inversion 4/5    Right Ankle Eversion 5/5                         [facility] Adult PT Treatment/Exercise - 05/15/20 0001      Ankle Exercises: Stretches   Slant Board Stretch 2 reps;30 seconds    Other Stretch MWM with green strap on slant board for increased DF 10 sec x10      Ankle Exercises: Aerobic   Nustep L5x 4 mins, L7 x4 mins      Ankle Exercises: Standing   SLS SLS 30 secx 2 intermittent UE    Rocker Board 2 minutes   CW, CCW rotations 20x   Heel Raises 20 reps    Toe Raise 20 reps    Other Standing Ankle Exercises step up and over no UE 4'' step 15x, lateral step down eccentric focus 10x, step up 6'' no UE 15x, step up and over 6'' step 8x                    PT Short Term Goals - 03/26/20 0850      PT SHORT TERM GOAL #1   Title RLE single leg stance >5 sec.    Baseline  MET 03/26/2020    Status Achieved    Target Date 03/27/20      PT SHORT TERM GOAL #2   Title Pt ambulates on indoor surfaces without assistive device with proper step width with cues only.    Baseline MET 03/26/2020    Status Achieved    Target Date 04/20/20             PT Long Term Goals - 05/15/20 0819      PT LONG TERM GOAL #1   Title Patient demonstrates & verbalizes understanding of ongoing HEP.    Baseline working towards advanced HEP and transition to I program    Time 2    Period Months    Status On-going      PT LONG TERM GOAL #2   Title FOTO score >/= 60% functional or <40% limited    Baseline 59% updated score    Time 2    Period Months    Status Partially Met      PT LONG TERM GOAL #3   Title right ankle range within 10* of left ankle range.    Baseline patient is within 10 degrees of L ankle motion    Status Achieved      PT LONG TERM GOAL #4   Title Patient ambulates >500' and negotiates ramps, curbs & stairs without device independently.    Baseline can step up without UE to 6'' step, trouble going down, curbs, ramps, walking not much trouble at all    Status Partially Met      PT LONG TERM GOAL #5   Title Right single leg stance >10seconds    Baseline 7 seconds at best    Status On-going      PT LONG TERM GOAL #6   Title right ankle & foot pain </= 2/10 with standing & gait activities    Baseline still working towards pain reduced standing and walking especially on concrete for more than 15 minutes                 Plan - 05/15/20 0851    Clinical Impression Statement Patient is making great strides toward her LTGs.  Her AROM is now within 10 degrees of her unaffected side and her strength has improved as well with only slight deficits. She is able to walk for longer periods of time without increased pain but is still having difficulty neogotiating stairs. Her balance is still very unstable as well. Patient can benefit from continue skilled PT  for decreased pain and more normalized gait for everyday walking and reciprocal stair climbing. Patient will follow up with Dr. at the end of march to weight options for continued PT or surgical removal of screw for decreased pain.    Personal Factors and Comorbidities Comorbidity 3+    Comorbidities left subtalar & talonavicular fusion 07/21/17, DDD, HTN    Examination-Activity Limitations Lift;Locomotion Level;Squat;Stairs;Sit;Stand    Examination-Participation Musician;Occupation    Stability/Clinical Decision Making Stable/Uncomplicated    Rehab Potential Good    PT Frequency 2x / week    PT Duration Other (comment)   9 weeks   PT Treatment/Interventions ADLs/Self Care Home Management;Electrical Stimulation;Cryotherapy;DME Instruction;Gait training;Stair training;Contrast Bath;Functional mobility training;Therapeutic activities;Therapeutic exercise;Balance training;Neuromuscular re-education;Patient/family education;Manual techniques;Scar mobilization;Passive range of motion;Dry needling;Vasopneumatic Device;Vestibular;Joint Manipulations    PT Next Visit Plan incline treadmill, stair progression/heel off/PF stretch , vaso for decreased swelling/pain relief    PT Home Exercise Plan Access Code: [email or web address]    Consulted and Agree with Plan of Care Patient           Patient will benefit from skilled therapeutic intervention in order to improve the following deficits and impairments:  Abnormal gait,Decreased balance,Decreased endurance,Decreased mobility,Decreased range of motion,Decreased scar mobility,Decreased strength,Increased edema,Impaired flexibility,Pain  Visit Diagnosis: Muscle weakness (generalized)  Stiffness of right ankle, not elsewhere classified  Localized edema  Unsteadiness on feet  Other abnormalities of gait and mobility  Pain in right ankle and joints of right foot     Problem List Patient Active Problem List   Diagnosis Date Noted   . Encounter for general adult medical examination with abnormal findings 03/12/2020  . Stage 3a chronic kidney disease ([place]) 03/12/2020  . Gastroesophageal reflux disease without esophagitis 02/13/2019  . Screening for cervical cancer 02/13/2019  . Visit for screening mammogram 02/13/2019  . Vitamin D deficiency disease 10/05/2017  . Obesity 06/08/2016  . DDD (degenerative disc disease), lumbar 05/21/2014  . Essential hypertension 05/21/2014    Glenetta Hew, SPT 05/15/2020, 8:57 AM   During this treatment session, this physical therapist was present, participating in and directing the treatment.   This note has been reviewed and this clinician agrees with the information provided.  Elsie Ra, PT, DPT 05/15/20 9:19 AM   [facility] Physical Therapy [address] [place], [place], [address] Phone: [phone]   Fax:  [phone]  Name: Jamie Jenkins MRN: [id number] Date of Birth: [birth year]

## 2020-05-17 ENCOUNTER — Ambulatory Visit (INDEPENDENT_AMBULATORY_CARE_PROVIDER_SITE_OTHER): Payer: BC Managed Care – PPO | Admitting: Physical Therapy

## 2020-05-17 ENCOUNTER — Other Ambulatory Visit: Payer: Self-pay

## 2020-05-17 DIAGNOSIS — M25671 Stiffness of right ankle, not elsewhere classified: Secondary | ICD-10-CM

## 2020-05-17 DIAGNOSIS — R2689 Other abnormalities of gait and mobility: Secondary | ICD-10-CM | POA: Diagnosis not present

## 2020-05-17 DIAGNOSIS — R600 Localized edema: Secondary | ICD-10-CM

## 2020-05-17 DIAGNOSIS — M25571 Pain in right ankle and joints of right foot: Secondary | ICD-10-CM | POA: Diagnosis not present

## 2020-05-17 DIAGNOSIS — M6281 Muscle weakness (generalized): Secondary | ICD-10-CM | POA: Diagnosis not present

## 2020-05-17 DIAGNOSIS — R2681 Unsteadiness on feet: Secondary | ICD-10-CM

## 2020-05-17 NOTE — Therapy (Signed)
[place], [place], [address] Phone: [phone]   Fax:  [phone]  Physical Therapy Treatment  Patient Details  Name: Jamie Jenkins MRN: [id number] Date of Birth: 1960-08-22 Referring Provider (PT): Bevely Palmer Persons, [place]   Encounter Date: 05/17/2020   PT End of Session - 05/17/20 0844    Visit Number 39    Number of Visits 50   updated based on recert on 1/84/0375   Date for PT Re-Evaluation 06/26/20    Authorization Type [employer] Comm PPO    Progress Note Due on Visit 55    PT Start Time 0802    PT Stop Time 0851    PT Time Calculation (min) 49 min    Activity Tolerance Patient limited by pain;Patient tolerated treatment well    Behavior During Therapy [facility] for tasks assessed/performed           Past Medical History:  Diagnosis Date  . Ankle fracture    right  . DDD (degenerative disc disease), lumbar   . Gall stones   . Hypertension     Past Surgical History:  Procedure Laterality Date  . ANKLE FUSION Left 07/21/2017   Procedure: SUBTALAR AND TALONAVICULAR FUSION LEFT FOOT;  Surgeon: Newt Minion, MD;  Location: [place];  Service: Orthopedics;  Laterality: Left;  . AXILLARY LYMPH NODE DISSECTION Left 11/24/2012   Procedure: incision and drainage axillary abscess;  Surgeon: Adin Hector, MD;  Location: [place];  Service: General;  Laterality: Left;  . BREAST SURGERY  2012   breast biopsy  . CHOLECYSTECTOMY    . ORIF ANKLE FRACTURE Right 11/22/2019   Procedure: OPEN REDUCTION INTERNAL FIXATION (ORIF) RIGHT ANKLE FRACTURE;  Surgeon: Newt Minion, MD;  Location: [place];  Service: Orthopedics;  Laterality: Right;  . TUBAL LIGATION      There were no vitals filed for this visit.   Subjective Assessment - 05/17/20 0811    Subjective Patient reports feeling good today but felt some lingering pain/soreness last night.    Pertinent History 11/22/2019 right ankle trimalleolar fx w/ORIF, left subtalar &  talonavicular fusion 07/21/17, DDD, HTN    How long can you sit comfortably? 20 minutes    How long can you stand comfortably? 45 minutes before having to take a break    How long can you walk comfortably? 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 Yes    Pain Score 3     Pain Location Ankle    Pain Orientation Right    Pain Onset More than a month ago            [facility] Adult PT Treatment/Exercise - 05/17/20 0001      Vasopneumatic   Number Minutes Vasopneumatic  10 minutes    Vasopnuematic Location  Ankle    Vasopneumatic Pressure High    Vasopneumatic Temperature  34      Ankle Exercises: Aerobic   Nustep L5 x80mns      Ankle Exercises: Stretches   Gastroc Stretch 3 reps;30 seconds   foot off step     Ankle Exercises: Standing   SLS SLS 30 secx 2 intermittent UE    Rocker Board 2 minutes   15x CW, CCW   Other Standing Ankle Exercises up/down ramp 5x lateral, 10x  forward    Other Standing Ankle Exercises 2,6,8,4 stair reciprocal climbing, 2'' lateral step down, 2'' forward step down, 6'' step up 15x      Ankle Exercises: Machines for Strengthening   Cybex Leg Press 12 lbs SL calf raise 20x            PT Short Term Goals - 05/15/20 0917      PT SHORT TERM GOAL #1   Title RLE single leg stance >5 sec.    Baseline MET 03/26/2020    Status Achieved    Target Date 03/27/20      PT SHORT TERM GOAL #2   Title Pt ambulates on indoor surfaces without assistive device with proper step width with cues only.    Baseline MET 03/26/2020    Status Achieved    Target Date 04/20/20             PT Long Term Goals - 05/15/20 0918      PT LONG TERM GOAL #1   Title Patient demonstrates & verbalizes understanding of ongoing HEP.    Baseline now met    Time 2    Period Months    Status Achieved      PT LONG  TERM GOAL #2   Title FOTO score >/= 60% functional or <40% limited    Baseline 59% updated score    Time 2    Period Months    Status Partially Met      PT LONG TERM GOAL #3   Title right ankle range within 10* of left ankle range.    Baseline patient is within 10 degrees of L ankle motion    Status Achieved      PT LONG TERM GOAL #4   Title Patient ambulates >500' and negotiates ramps, curbs & stairs without device independently.    Baseline can step up without UE to 6'' step, trouble going down, curbs, ramps, walking not much trouble at all    Status Partially Met      PT LONG TERM GOAL #5   Title Right single leg stance >10seconds    Baseline 7 seconds at best    Status On-going      PT LONG TERM GOAL #6   Title right ankle & foot pain </= 2/10 with standing & gait activities    Baseline still working towards pain reduced standing and walking especially on concrete for more than 15 minutes                 Plan - 05/17/20 2035    Clinical Impression Statement Patient was able to tolerate reciprocal stair climbing with 2-4'' stairs ascending anf descending. She is able to tolerate more equal WB and more standing exercise than she has in the past few weeks. Continue to gradually increase patient tolerance to taller stairs and increase PF strength.    Personal Factors and Comorbidities Comorbidity 3+    Comorbidities left subtalar & talonavicular fusion 07/21/17, DDD, HTN    Examination-Activity Limitations Lift;Locomotion Level;Squat;Stairs;Sit;Stand    Examination-Participation Musician;Occupation    Stability/Clinical Decision Making Stable/Uncomplicated    Rehab Potential Good    PT Frequency 2x / week    PT Duration Other (comment)   9 weeks   PT Treatment/Interventions ADLs/Self Care Home Management;Electrical Stimulation;Cryotherapy;DME Instruction;Gait training;Stair training;Contrast Bath;Functional mobility training;Therapeutic  activities;Therapeutic exercise;Balance training;Neuromuscular re-education;Patient/family education;Manual techniques;Scar mobilization;Passive range of motion;Dry needling;Vasopneumatic Device;Vestibular;Joint Manipulations    PT Next Visit Plan incline treadmill, leg press,  PF strength focus/ stair progression    PT Home Exercise Plan Access Code: [email or web address]    Consulted and Agree with Plan of Care Patient           Patient will benefit from skilled therapeutic intervention in order to improve the following deficits and impairments:  Abnormal gait,Decreased balance,Decreased endurance,Decreased mobility,Decreased range of motion,Decreased scar mobility,Decreased strength,Increased edema,Impaired flexibility,Pain  Visit Diagnosis: Muscle weakness (generalized)  Other abnormalities of gait and mobility  Pain in right ankle and joints of right foot  Stiffness of right ankle, not elsewhere classified  Localized edema  Unsteadiness on feet     Problem List Patient Active Problem List   Diagnosis Date Noted  . Encounter for general adult medical examination with abnormal findings 03/12/2020  . Stage 3a chronic kidney disease ([place]) 03/12/2020  . Gastroesophageal reflux disease without esophagitis 02/13/2019  . Screening for cervical cancer 02/13/2019  . Visit for screening mammogram 02/13/2019  . Vitamin D deficiency disease 10/05/2017  . Obesity 06/08/2016  . DDD (degenerative disc disease), lumbar 05/21/2014  . Essential hypertension 05/21/2014    Glenetta Hew, SPT 05/17/2020, 8:54 AM   During this treatment session, this physical therapist was present, participating in and directing the treatment.   This note has been reviewed and this clinician agrees with the information provided.  Elsie Ra, PT, DPT 05/17/20 10:25 AM   [facility] Physical Therapy [address] [place], [place], [address] Phone: [phone]   Fax:  [phone]  Name: Jamie Jenkins MRN: [id number] Date of Birth: Feb 04, 1961

## 2020-05-20 ENCOUNTER — Telehealth: Payer: Self-pay | Admitting: Physical Therapy

## 2020-05-20 ENCOUNTER — Encounter: Payer: BC Managed Care – PPO | Admitting: Physical Therapy

## 2020-05-20 DIAGNOSIS — H5203 Hypermetropia, bilateral: Secondary | ICD-10-CM | POA: Diagnosis not present

## 2020-05-20 DIAGNOSIS — H2513 Age-related nuclear cataract, bilateral: Secondary | ICD-10-CM | POA: Diagnosis not present

## 2020-05-20 DIAGNOSIS — H524 Presbyopia: Secondary | ICD-10-CM | POA: Diagnosis not present

## 2020-05-20 NOTE — Telephone Encounter (Signed)
LVM for pt due to NS.  Advised to call office if she needs to cx appts.    Laureen Abrahams, PT, DPT 05/20/20 8:18 AM

## 2020-05-22 ENCOUNTER — Ambulatory Visit (INDEPENDENT_AMBULATORY_CARE_PROVIDER_SITE_OTHER): Payer: BC Managed Care – PPO | Admitting: Physical Therapy

## 2020-05-22 ENCOUNTER — Encounter: Payer: Self-pay | Admitting: Physical Therapy

## 2020-05-22 ENCOUNTER — Other Ambulatory Visit: Payer: Self-pay

## 2020-05-22 DIAGNOSIS — R2689 Other abnormalities of gait and mobility: Secondary | ICD-10-CM

## 2020-05-22 DIAGNOSIS — M25571 Pain in right ankle and joints of right foot: Secondary | ICD-10-CM

## 2020-05-22 DIAGNOSIS — M6281 Muscle weakness (generalized): Secondary | ICD-10-CM

## 2020-05-22 DIAGNOSIS — M25671 Stiffness of right ankle, not elsewhere classified: Secondary | ICD-10-CM

## 2020-05-22 DIAGNOSIS — R2681 Unsteadiness on feet: Secondary | ICD-10-CM

## 2020-05-22 DIAGNOSIS — R600 Localized edema: Secondary | ICD-10-CM

## 2020-05-22 NOTE — Therapy (Signed)
[place], [place], [address] Phone: [phone]   Fax:  [phone]  Physical Therapy Treatment  Patient Details  Name: Jamie [facility] MRN: [id number] Date of Birth: 1960-06-21 Referring Provider (PT): Bevely Palmer Persons, [place]   Encounter Date: 05/22/2020   PT End of Session - 05/22/20 0839    Visit Number 40    Number of Visits 50   updated based on recert on 11/01/2991   Date for PT Re-Evaluation 06/26/20    Authorization Type [employer] PPO    Authorization - Visit Number 62   starting 03/16/20   Authorization - Number of Visits 30    Progress Note Due on Visit 58    PT Start Time 0758    PT Stop Time 0848    PT Time Calculation (min) 50 min    Activity Tolerance Patient limited by pain;Patient tolerated treatment well    Behavior During Therapy [facility] for tasks assessed/performed           Past Medical History:  Diagnosis Date  . Ankle fracture    right  . DDD (degenerative disc disease), lumbar   . Gall stones   . Hypertension     Past Surgical History:  Procedure Laterality Date  . ANKLE FUSION Left 07/21/2017   Procedure: SUBTALAR AND TALONAVICULAR FUSION LEFT FOOT;  Surgeon: Newt Minion, MD;  Location: [place];  Service: Orthopedics;  Laterality: Left;  . AXILLARY LYMPH NODE DISSECTION Left 11/24/2012   Procedure: incision and drainage axillary abscess;  Surgeon: Adin Hector, MD;  Location: [place];  Service: General;  Laterality: Left;  . BREAST SURGERY  2012   breast biopsy  . CHOLECYSTECTOMY    . ORIF ANKLE FRACTURE Right 11/22/2019   Procedure: OPEN REDUCTION INTERNAL FIXATION (ORIF) RIGHT ANKLE FRACTURE;  Surgeon: Newt Minion, MD;  Location: [place];  Service: Orthopedics;  Laterality: Right;  . TUBAL LIGATION      There were no vitals filed for this visit.   Subjective Assessment - 05/22/20 0802    Subjective doing well, no pain in ankle today    Pertinent History 11/22/2019 right ankle  trimalleolar fx w/ORIF, left subtalar & talonavicular fusion 07/21/17, DDD, HTN    How long can you sit comfortably? 20 minutes    How long can you stand comfortably? 45 minutes before having to take a break    How long can you walk comfortably? 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 No/denies                             [facility] Adult PT Treatment/Exercise - 05/22/20 0807      Vasopneumatic   Number Minutes Vasopneumatic  10 minutes    Vasopnuematic Location  Ankle    Vasopneumatic Pressure High    Vasopneumatic Temperature  34      Ankle Exercises: Aerobic   Tread Mill 1.5 mph, incline 2-3% x 5 min      Ankle Exercises: Stretches   Slant Board Stretch 3 reps;30 seconds   gastroc/soleus     Ankle Exercises: Standing   SLS SLS 30 secx 2 intermittent UE    Heel Raises --   3x10; on slantboard   Other Standing  Ankle Exercises lateral heel tap downs from 4" step 2x10 bil; bil UE support needed      Ankle Exercises: Machines for Strengthening   Cybex Leg Press attempted calf raise on leg press - pt unable to perform      Ankle Exercises: Seated   Other Seated Ankle Exercises seated FL x30 reps; double L5 band                    PT Short Term Goals - 05/15/20 0917      PT SHORT TERM GOAL #1   Title RLE single leg stance >5 sec.    Baseline MET 03/26/2020    Status Achieved    Target Date 03/27/20      PT SHORT TERM GOAL #2   Title Pt ambulates on indoor surfaces without assistive device with proper step width with cues only.    Baseline MET 03/26/2020    Status Achieved    Target Date 04/20/20             PT Long Term Goals - 05/15/20 0918      PT LONG TERM GOAL #1   Title Patient demonstrates & verbalizes understanding of ongoing HEP.    Baseline now met    Time 2     Period Months    Status Achieved      PT LONG TERM GOAL #2   Title FOTO score >/= 60% functional or <40% limited    Baseline 59% updated score    Time 2    Period Months    Status Partially Met      PT LONG TERM GOAL #3   Title right ankle range within 10* of left ankle range.    Baseline patient is within 10 degrees of L ankle motion    Status Achieved      PT LONG TERM GOAL #4   Title Patient ambulates >500' and negotiates ramps, curbs & stairs without device independently.    Baseline can step up without UE to 6'' step, trouble going down, curbs, ramps, walking not much trouble at all    Status Partially Met      PT LONG TERM GOAL #5   Title Right single leg stance >10seconds    Baseline 7 seconds at best    Status On-going      PT LONG TERM GOAL #6   Title right ankle & foot pain </= 2/10 with standing & gait activities    Baseline still working towards pain reduced standing and walking especially on concrete for more than 15 minutes                 Plan - 05/22/20 0840    Clinical Impression Statement Session focused on PF strength and balance activities, with expected fatigue and pain following session.  Will continue to benefit from PT to maximize function.  Pt considering hardware removal due to continued pain and stiffness.    Personal Factors and Comorbidities Comorbidity 3+    Comorbidities left subtalar & talonavicular fusion 07/21/17, DDD, HTN    Examination-Activity Limitations Lift;Locomotion Level;Squat;Stairs;Sit;Stand    Examination-Participation Musician;Occupation    Stability/Clinical Decision Making Stable/Uncomplicated    Rehab Potential Good    PT Frequency 2x / week    PT Duration Other (comment)   9 weeks   PT Treatment/Interventions ADLs/Self Care Home Management;Electrical Stimulation;Cryotherapy;DME Instruction;Gait training;Stair training;Contrast Bath;Functional mobility training;Therapeutic  activities;Therapeutic exercise;Balance training;Neuromuscular re-education;Patient/family education;Manual techniques;Scar mobilization;Passive range of motion;Dry  needling;Vasopneumatic Device;Vestibular;Joint Manipulations    PT Next Visit Plan incline treadmill, leg press, PF strength focus/ stair progression, balance activities    PT Home Exercise Plan Access Code: [email or web address]    Consulted and Agree with Plan of Care Patient           Patient will benefit from skilled therapeutic intervention in order to improve the following deficits and impairments:  Abnormal gait,Decreased balance,Decreased endurance,Decreased mobility,Decreased range of motion,Decreased scar mobility,Decreased strength,Increased edema,Impaired flexibility,Pain  Visit Diagnosis: Muscle weakness (generalized)  Other abnormalities of gait and mobility  Pain in right ankle and joints of right foot  Stiffness of right ankle, not elsewhere classified  Localized edema  Unsteadiness on feet     Problem List Patient Active Problem List   Diagnosis Date Noted  . Encounter for general adult medical examination with abnormal findings 03/12/2020  . Stage 3a chronic kidney disease ([place]) 03/12/2020  . Gastroesophageal reflux disease without esophagitis 02/13/2019  . Screening for cervical cancer 02/13/2019  . Visit for screening mammogram 02/13/2019  . Vitamin D deficiency disease 10/05/2017  . Obesity 06/08/2016  . DDD (degenerative disc disease), lumbar 05/21/2014  . Essential hypertension 05/21/2014      Laureen Abrahams, PT, DPT 05/22/20 8:42 AM   [facility] Physical Therapy [address] [place], [place], [address] Phone: [phone]   Fax:  [phone]  Name: Jamie [facility] MRN: [id number] Date of Birth: 1960/08/03

## 2020-05-27 ENCOUNTER — Ambulatory Visit (INDEPENDENT_AMBULATORY_CARE_PROVIDER_SITE_OTHER): Payer: BC Managed Care – PPO | Admitting: Physical Therapy

## 2020-05-27 ENCOUNTER — Other Ambulatory Visit: Payer: Self-pay

## 2020-05-27 ENCOUNTER — Encounter: Payer: Self-pay | Admitting: Physical Therapy

## 2020-05-27 DIAGNOSIS — M6281 Muscle weakness (generalized): Secondary | ICD-10-CM | POA: Diagnosis not present

## 2020-05-27 DIAGNOSIS — R2689 Other abnormalities of gait and mobility: Secondary | ICD-10-CM

## 2020-05-27 DIAGNOSIS — R600 Localized edema: Secondary | ICD-10-CM

## 2020-05-27 DIAGNOSIS — M25671 Stiffness of right ankle, not elsewhere classified: Secondary | ICD-10-CM

## 2020-05-27 DIAGNOSIS — M25571 Pain in right ankle and joints of right foot: Secondary | ICD-10-CM

## 2020-05-27 NOTE — Therapy (Signed)
[place], [place], [address] Phone: [phone]   Fax:  [phone]  Physical Therapy Treatment  Patient Details  Name: Jamie [facility] MRN: [id number] Date of Birth: 1961-01-06 Referring Provider (PT): Bevely Palmer Persons, [place]   Encounter Date: 05/27/2020   PT End of Session - 05/27/20 0918    Visit Number 41    Number of Visits 50   updated based on recert on 09/09/9483   Date for PT Re-Evaluation 06/26/20    Authorization Type [employer] Comm PPO    Authorization - Visit Number 18   starting 03/16/20   Authorization - Number of Visits 30    Progress Note Due on Visit 20    PT Start Time 0843    PT Stop Time 0928    PT Time Calculation (min) 45 min    Activity Tolerance Patient limited by pain;Patient tolerated treatment well    Behavior During Therapy [facility] for tasks assessed/performed           Past Medical History:  Diagnosis Date  . Ankle fracture    right  . DDD (degenerative disc disease), lumbar   . Gall stones   . Hypertension     Past Surgical History:  Procedure Laterality Date  . ANKLE FUSION Left 07/21/2017   Procedure: SUBTALAR AND TALONAVICULAR FUSION LEFT FOOT;  Surgeon: Newt Minion, MD;  Location: [facility];  Service: Orthopedics;  Laterality: Left;  . AXILLARY LYMPH NODE DISSECTION Left 11/24/2012   Procedure: incision and drainage axillary abscess;  Surgeon: Adin Hector, MD;  Location: [facility];  Service: General;  Laterality: Left;  . BREAST SURGERY  2012   breast biopsy  . CHOLECYSTECTOMY    . ORIF ANKLE FRACTURE Right 11/22/2019   Procedure: OPEN REDUCTION INTERNAL FIXATION (ORIF) RIGHT ANKLE FRACTURE;  Surgeon: Newt Minion, MD;  Location: [facility];  Service: Orthopedics;  Laterality: Right;  . TUBAL LIGATION      There were no vitals filed for this visit.   Subjective Assessment - 05/27/20 0850    Subjective Relays she had more ankle pain around 4-5am this morning, but it has resolved mostly  since then.    Pertinent History 11/22/2019 right ankle trimalleolar fx w/ORIF, left subtalar & talonavicular fusion 07/21/17, DDD, HTN    How long can you sit comfortably? 20 minutes    How long can you stand comfortably? 45 minutes before having to take a break    How long can you walk comfortably? 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    Pain Onset More than a month ago              [facility] PT Assessment - 05/27/20 0001      Assessment   Medical Diagnosis Right Trimalleolar Fracture ORIF    Referring Provider (PT) Bevely Palmer Persons, [place]    Onset Date/Surgical Date 11/22/19      AROM   Right Ankle Dorsiflexion 5      Strength   Right Ankle Plantar Flexion 4+/5   tested in supine                        [facility] Adult PT Treatment/Exercise - 05/27/20 0001      Vasopneumatic   Number Minutes Vasopneumatic  10 minutes  Vasopnuematic Location  Ankle    Vasopneumatic Pressure High    Vasopneumatic Temperature  34      Ankle Exercises: Aerobic   Tread Mill 1.5 mph, incline 3% x 5 min    Recumbent Bike L3 X 5 min      Ankle Exercises: Stretches   Gastroc Stretch 3 reps;30 seconds    Slant Board Stretch 3 reps;30 seconds      Ankle Exercises: Standing   SLS SLS 30 secx 2 intermittent UE    Other Standing Ankle Exercises up/down ramp X 3 fwd and lateral    Other Standing Ankle Exercises fwd step downs keeping Rt on 4 inch step X 15 with one UE support      Ankle Exercises: Machines for Strengthening   Cybex Leg Press 50 lbs SL press on Rt 2X10      Ankle Exercises: Seated   Other Seated Ankle Exercises seated PF L5 and L4 band 3X10                    PT Short Term Goals - 05/15/20 6440      PT SHORT TERM GOAL #1   Title RLE single leg stance >5 sec.    Baseline MET 03/26/2020    Status Achieved     Target Date 03/27/20      PT SHORT TERM GOAL #2   Title Pt ambulates on indoor surfaces without assistive device with proper step width with cues only.    Baseline MET 03/26/2020    Status Achieved    Target Date 04/20/20             PT Long Term Goals - 05/15/20 0918      PT LONG TERM GOAL #1   Title Patient demonstrates & verbalizes understanding of ongoing HEP.    Baseline now met    Time 2    Period Months    Status Achieved      PT LONG TERM GOAL #2   Title FOTO score >/= 60% functional or <40% limited    Baseline 59% updated score    Time 2    Period Months    Status Partially Met      PT LONG TERM GOAL #3   Title right ankle range within 10* of left ankle range.    Baseline patient is within 10 degrees of L ankle motion    Status Achieved      PT LONG TERM GOAL #4   Title Patient ambulates >500' and negotiates ramps, curbs & stairs without device independently.    Baseline can step up without UE to 6'' step, trouble going down, curbs, ramps, walking not much trouble at all    Status Partially Met      PT LONG TERM GOAL #5   Title Right single leg stance >10seconds    Baseline 7 seconds at best    Status On-going      PT LONG TERM GOAL #6   Title right ankle & foot pain </= 2/10 with standing & gait activities    Baseline still working towards pain reduced standing and walking especially on concrete for more than 15 minutes                 Plan - 05/27/20 0920    Clinical Impression Statement Continued to work on her biggest deficits which are DF ROM, PF strenght, and overall standing activity tolerance for her Rt ankle. She shows great effort  with PT and is slowly progressing in these areas and we will continue to progress as she can tolerate.    Personal Factors and Comorbidities Comorbidity 3+    Comorbidities left subtalar & talonavicular fusion 07/21/17, DDD, HTN    Examination-Activity Limitations Lift;Locomotion Level;Squat;Stairs;Sit;Stand     Examination-Participation Musician;Occupation    Stability/Clinical Decision Making Stable/Uncomplicated    Rehab Potential Good    PT Frequency 2x / week    PT Duration Other (comment)   9 weeks   PT Treatment/Interventions ADLs/Self Care Home Management;Electrical Stimulation;Cryotherapy;DME Instruction;Gait training;Stair training;Contrast Bath;Functional mobility training;Therapeutic activities;Therapeutic exercise;Balance training;Neuromuscular re-education;Patient/family education;Manual techniques;Scar mobilization;Passive range of motion;Dry needling;Vasopneumatic Device;Vestibular;Joint Manipulations    PT Next Visit Plan incline treadmill, leg press, PF strength focus/ stair progression, balance activities    PT Home Exercise Plan Access Code: [email or web address]    Consulted and Agree with Plan of Care Patient           Patient will benefit from skilled therapeutic intervention in order to improve the following deficits and impairments:  Abnormal gait,Decreased balance,Decreased endurance,Decreased mobility,Decreased range of motion,Decreased scar mobility,Decreased strength,Increased edema,Impaired flexibility,Pain  Visit Diagnosis: Muscle weakness (generalized)  Other abnormalities of gait and mobility  Pain in right ankle and joints of right foot  Stiffness of right ankle, not elsewhere classified  Localized edema     Problem List Patient Active Problem List   Diagnosis Date Noted  . Encounter for general adult medical examination with abnormal findings 03/12/2020  . Stage 3a chronic kidney disease ([place]) 03/12/2020  . Gastroesophageal reflux disease without esophagitis 02/13/2019  . Screening for cervical cancer 02/13/2019  . Visit for screening mammogram 02/13/2019  . Vitamin D deficiency disease 10/05/2017  . Obesity 06/08/2016  . DDD (degenerative disc disease), lumbar 05/21/2014  . Essential hypertension 05/21/2014    Debbe Odea,  PT,DPT 05/27/2020, 9:21 AM  [facility] Physical Therapy [address] [place], [place], [address] Phone: [phone]   Fax:  [phone]  Name: Hannah Strader MRN: [id number] Date of Birth: 1960-07-22

## 2020-05-29 ENCOUNTER — Ambulatory Visit (INDEPENDENT_AMBULATORY_CARE_PROVIDER_SITE_OTHER): Payer: BC Managed Care – PPO | Admitting: Physical Therapy

## 2020-05-29 ENCOUNTER — Encounter: Payer: Self-pay | Admitting: Physical Therapy

## 2020-05-29 ENCOUNTER — Other Ambulatory Visit: Payer: Self-pay

## 2020-05-29 DIAGNOSIS — M25571 Pain in right ankle and joints of right foot: Secondary | ICD-10-CM

## 2020-05-29 DIAGNOSIS — R2689 Other abnormalities of gait and mobility: Secondary | ICD-10-CM

## 2020-05-29 DIAGNOSIS — M25671 Stiffness of right ankle, not elsewhere classified: Secondary | ICD-10-CM | POA: Diagnosis not present

## 2020-05-29 DIAGNOSIS — R600 Localized edema: Secondary | ICD-10-CM

## 2020-05-29 DIAGNOSIS — M6281 Muscle weakness (generalized): Secondary | ICD-10-CM | POA: Diagnosis not present

## 2020-05-29 NOTE — Therapy (Signed)
[place], [place], [address] Phone: [phone]   Fax:  [phone]  Physical Therapy Treatment  Patient Details  Name: Jamie [facility] MRN: [id number] Date of Birth: August 19, 1960 Referring Provider (PT): Bevely Palmer Persons, [place]   Encounter Date: 05/29/2020   PT End of Session - 05/29/20 0808    Visit Number 42    Number of Visits 50   updated based on recert on 7/93/9030   Date for PT Re-Evaluation 06/26/20    Authorization Type [employer] Comm PPO    Authorization - Visit Number 63   starting 03/16/20   Authorization - Number of Visits 30    Progress Note Due on Visit 48    PT Start Time 0804    PT Stop Time 0850    PT Time Calculation (min) 46 min    Activity Tolerance Patient limited by pain;Patient tolerated treatment well    Behavior During Therapy [facility] for tasks assessed/performed           Past Medical History:  Diagnosis Date  . Ankle fracture    right  . DDD (degenerative disc disease), lumbar   . Gall stones   . Hypertension     Past Surgical History:  Procedure Laterality Date  . ANKLE FUSION Left 07/21/2017   Procedure: SUBTALAR AND TALONAVICULAR FUSION LEFT FOOT;  Surgeon: Newt Minion, MD;  Location: [facility];  Service: Orthopedics;  Laterality: Left;  . AXILLARY LYMPH NODE DISSECTION Left 11/24/2012   Procedure: incision and drainage axillary abscess;  Surgeon: Adin Hector, MD;  Location: [place];  Service: General;  Laterality: Left;  . BREAST SURGERY  2012   breast biopsy  . CHOLECYSTECTOMY    . ORIF ANKLE FRACTURE Right 11/22/2019   Procedure: OPEN REDUCTION INTERNAL FIXATION (ORIF) RIGHT ANKLE FRACTURE;  Surgeon: Newt Minion, MD;  Location: [facility] [place];  Service: Orthopedics;  Laterality: Right;  . TUBAL LIGATION      There were no vitals filed for this visit.   Subjective Assessment - 05/29/20 0807    Subjective Relays she has about 2-3 out of 10 pain in her ankle this morning upon arrival.     Pertinent History 11/22/2019 right ankle trimalleolar fx w/ORIF, left subtalar & talonavicular fusion 07/21/17, DDD, HTN    How long can you sit comfortably? 20 minutes    How long can you stand comfortably? 45 minutes before having to take a break    How long can you walk comfortably? 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    Pain Onset More than a month ago            [facility] Adult PT Treatment/Exercise - 05/29/20 0001      Neuro Re-ed    Neuro Re-ed Details  tandem walk and latreal walk on foam beam, 4 round trips, SLS 30 secx 2 intermittent UE      Vasopneumatic   Number Minutes Vasopneumatic  10 minutes    Vasopnuematic Location  Ankle    Vasopneumatic Pressure High    Vasopneumatic Temperature  34      Ankle Exercises: Aerobic   Tread Mill 1.5 mph, incline 4% x 5 min    Nustep L5 X 5 min      Ankle Exercises: Stretches   Gastroc Stretch 3 reps;30 seconds  Slant Board Stretch 3 reps;30 seconds    Other Stretch MWM with green strap on slant board for increased DF 10 sec x10      Ankle Exercises: Standing   Rocker Board --   round board, 15 reps ea for  A-P, lateral, circles   Other Standing Ankle Exercises up/down ramp X 3 fwd and lateral    Other Standing Ankle Exercises fwd step downs keeping Rt on 4 inch step X 15 with one UE support      Ankle Exercises: Seated   Other Seated Ankle Exercises seated PF L5 and L4 band 3X10                    PT Short Term Goals - 05/15/20 0917      PT SHORT TERM GOAL #1   Title RLE single leg stance >5 sec.    Baseline MET 03/26/2020    Status Achieved    Target Date 03/27/20      PT SHORT TERM GOAL #2   Title Pt ambulates on indoor surfaces without assistive device with proper step width with cues only.    Baseline MET 03/26/2020    Status Achieved    Target Date 04/20/20              PT Long Term Goals - 05/15/20 0918      PT LONG TERM GOAL #1   Title Patient demonstrates & verbalizes understanding of ongoing HEP.    Baseline now met    Time 2    Period Months    Status Achieved      PT LONG TERM GOAL #2   Title FOTO score >/= 60% functional or <40% limited    Baseline 59% updated score    Time 2    Period Months    Status Partially Met      PT LONG TERM GOAL #3   Title right ankle range within 10* of left ankle range.    Baseline patient is within 10 degrees of L ankle motion    Status Achieved      PT LONG TERM GOAL #4   Title Patient ambulates >500' and negotiates ramps, curbs & stairs without device independently.    Baseline can step up without UE to 6'' step, trouble going down, curbs, ramps, walking not much trouble at all    Status Partially Met      PT LONG TERM GOAL #5   Title Right single leg stance >10seconds    Baseline 7 seconds at best    Status On-going      PT LONG TERM GOAL #6   Title right ankle & foot pain </= 2/10 with standing & gait activities    Baseline still working towards pain reduced standing and walking especially on concrete for more than 15 minutes                 Plan - 05/29/20 0829    Personal Factors and Comorbidities Comorbidity 3+    Comorbidities left subtalar & talonavicular fusion 07/21/17, DDD, HTN    Examination-Activity Limitations Lift;Locomotion Level;Squat;Stairs;Sit;Stand    Examination-Participation Musician;Occupation    Stability/Clinical Decision Making Stable/Uncomplicated    Rehab Potential Good    PT Frequency 2x / week    PT Duration Other (comment)   9 weeks   PT Treatment/Interventions ADLs/Self Care Home Management;Electrical Stimulation;Cryotherapy;DME Instruction;Gait training;Stair training;Contrast Bath;Functional mobility training;Therapeutic activities;Therapeutic exercise;Balance training;Neuromuscular re-education;Patient/family  education;Manual techniques;Scar mobilization;Passive range of  motion;Dry needling;Vasopneumatic Device;Vestibular;Joint Manipulations    PT Next Visit Plan incline treadmill, leg press, PF strength focus/ stair progression, balance activities    PT Home Exercise Plan Access Code: [email or web address]    Consulted and Agree with Plan of Care Patient           Patient will benefit from skilled therapeutic intervention in order to improve the following deficits and impairments:  Abnormal gait,Decreased balance,Decreased endurance,Decreased mobility,Decreased range of motion,Decreased scar mobility,Decreased strength,Increased edema,Impaired flexibility,Pain  Visit Diagnosis: Muscle weakness (generalized)  Other abnormalities of gait and mobility  Pain in right ankle and joints of right foot  Stiffness of right ankle, not elsewhere classified  Localized edema     Problem List Patient Active Problem List   Diagnosis Date Noted  . Encounter for general adult medical examination with abnormal findings 03/12/2020  . Stage 3a chronic kidney disease ([place]) 03/12/2020  . Gastroesophageal reflux disease without esophagitis 02/13/2019  . Screening for cervical cancer 02/13/2019  . Visit for screening mammogram 02/13/2019  . Vitamin D deficiency disease 10/05/2017  . Obesity 06/08/2016  . DDD (degenerative disc disease), lumbar 05/21/2014  . Essential hypertension 05/21/2014    Silvestre Mesi 05/29/2020, 8:52 AM  [facility] Physical Therapy [address] [place], [place], [address] Phone: [phone]   Fax:  [phone]  Name: Jamie [facility] MRN: [id number] Date of Birth: [birth year]

## 2020-06-03 ENCOUNTER — Encounter: Payer: Self-pay | Admitting: Physical Therapy

## 2020-06-03 ENCOUNTER — Other Ambulatory Visit: Payer: Self-pay

## 2020-06-03 ENCOUNTER — Ambulatory Visit (INDEPENDENT_AMBULATORY_CARE_PROVIDER_SITE_OTHER): Payer: BC Managed Care – PPO | Admitting: Physical Therapy

## 2020-06-03 DIAGNOSIS — R2689 Other abnormalities of gait and mobility: Secondary | ICD-10-CM

## 2020-06-03 DIAGNOSIS — M25671 Stiffness of right ankle, not elsewhere classified: Secondary | ICD-10-CM

## 2020-06-03 DIAGNOSIS — M6281 Muscle weakness (generalized): Secondary | ICD-10-CM

## 2020-06-03 DIAGNOSIS — M25571 Pain in right ankle and joints of right foot: Secondary | ICD-10-CM

## 2020-06-03 DIAGNOSIS — R2681 Unsteadiness on feet: Secondary | ICD-10-CM

## 2020-06-03 DIAGNOSIS — R600 Localized edema: Secondary | ICD-10-CM

## 2020-06-03 NOTE — Therapy (Signed)
[place] [place], [place], [address] Phone: [phone]   Fax:  [phone]  Physical Therapy Treatment  Patient Details  Name: Jamie Jenkins MRN: [id number] Date of Birth: [birth year] Referring Provider (PT): Bevely Palmer Persons, [place]   Encounter Date: 06/03/2020   PT End of Session - 06/03/20 0840    Visit Number 43    Number of Visits 50   updated based on recert on 08/30/8370   Date for PT Re-Evaluation 06/26/20    Authorization Type [employer] Comm PPO    Authorization - Visit Number 20   starting 03/16/20   Authorization - Number of Visits 30    Progress Note Due on Visit 48    PT Start Time 0800    PT Stop Time 0848    PT Time Calculation (min) 48 min    Activity Tolerance Patient limited by pain;Patient tolerated treatment well    Behavior During Therapy [facility] for tasks assessed/performed           Past Medical History:  Diagnosis Date  . Ankle fracture    right  . DDD (degenerative disc disease), lumbar   . Gall stones   . Hypertension     Past Surgical History:  Procedure Laterality Date  . ANKLE FUSION Left 07/21/2017   Procedure: SUBTALAR AND TALONAVICULAR FUSION LEFT FOOT;  Surgeon: Newt Minion, MD;  Location: [place];  Service: Orthopedics;  Laterality: Left;  . AXILLARY LYMPH NODE DISSECTION Left 11/24/2012   Procedure: incision and drainage axillary abscess;  Surgeon: Adin Hector, MD;  Location: [place];  Service: General;  Laterality: Left;  . BREAST SURGERY  2012   breast biopsy  . CHOLECYSTECTOMY    . ORIF ANKLE FRACTURE Right 11/22/2019   Procedure: OPEN REDUCTION INTERNAL FIXATION (ORIF) RIGHT ANKLE FRACTURE;  Surgeon: Newt Minion, MD;  Location: [place];  Service: Orthopedics;  Laterality: Right;  . TUBAL LIGATION      There were no vitals filed for this visit.   Subjective Assessment - 06/03/20 0802    Subjective No pain today in ankle - feels it's getting better    Pertinent History 11/22/2019  right ankle trimalleolar fx w/ORIF, left subtalar & talonavicular fusion 07/21/17, DDD, HTN    How long can you sit comfortably? 20 minutes    How long can you stand comfortably? 45 minutes before having to take a break    How long can you walk comfortably? 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 No/denies    Pain Onset More than a month ago                             [facility] Adult PT Treatment/Exercise - 06/03/20 0805      Vasopneumatic   Number Minutes Vasopneumatic  10 minutes    Vasopnuematic Location  Ankle    Vasopneumatic Pressure High    Vasopneumatic Temperature  34      Ankle Exercises: Aerobic   Nustep L6 x 8 min      Ankle Exercises: Stretches   Slant Board Stretch 3 reps;30 seconds      Ankle Exercises: Seated   Other Seated Ankle Exercises seated PF L5 and L4 band 3X10  Ankle Exercises: Standing   SLS on compliant surface 5x15 sec with intermittent UE support    Rocker Board --   round board, 15 reps ea for  A-P, lateral, circles - bil feet on board   Other Standing Ankle Exercises step down with RLE on 4" step - heel tap down with LLE x 10 reps; crossover step up onto 4" step x 10 reps bl    Other Standing Ankle Exercises squat with heel raises and 5# KB; 2x10                    PT Short Term Goals - 05/15/20 4944      PT SHORT TERM GOAL #1   Title RLE single leg stance >5 sec.    Baseline MET 03/26/2020    Status Achieved    Target Date 03/27/20      PT SHORT TERM GOAL #2   Title Pt ambulates on indoor surfaces without assistive device with proper step width with cues only.    Baseline MET 03/26/2020    Status Achieved    Target Date 04/20/20             PT Long Term Goals - 05/15/20 0918      PT LONG TERM GOAL #1   Title Patient demonstrates &  verbalizes understanding of ongoing HEP.    Baseline now met    Time 2    Period Months    Status Achieved      PT LONG TERM GOAL #2   Title FOTO score >/= 60% functional or <40% limited    Baseline 59% updated score    Time 2    Period Months    Status Partially Met      PT LONG TERM GOAL #3   Title right ankle range within 10* of left ankle range.    Baseline patient is within 10 degrees of L ankle motion    Status Achieved      PT LONG TERM GOAL #4   Title Patient ambulates >500' and negotiates ramps, curbs & stairs without device independently.    Baseline can step up without UE to 6'' step, trouble going down, curbs, ramps, walking not much trouble at all    Status Partially Met      PT LONG TERM GOAL #5   Title Right single leg stance >10seconds    Baseline 7 seconds at best    Status On-going      PT LONG TERM GOAL #6   Title right ankle & foot pain </= 2/10 with standing & gait activities    Baseline still working towards pain reduced standing and walking especially on concrete for more than 15 minutes                 Plan - 06/03/20 0840    Clinical Impression Statement Pt tolerated session well today and overall doing well from functional standpoint.  Anticipate d/c next visit, and will follow up with Dr. Sharol Given at next visit.    Personal Factors and Comorbidities Comorbidity 3+    Comorbidities left subtalar & talonavicular fusion 07/21/17, DDD, HTN    Examination-Activity Limitations Lift;Locomotion Level;Squat;Stairs;Sit;Stand    Examination-Participation Musician;Occupation    Stability/Clinical Decision Making Stable/Uncomplicated    Rehab Potential Good    PT Frequency 2x / week    PT Duration Other (comment)   9 weeks   PT Treatment/Interventions ADLs/Self Care Home Management;Electrical Stimulation;Cryotherapy;DME Instruction;Gait training;Stair training;Contrast  Bath;Functional mobility training;Therapeutic  activities;Therapeutic exercise;Balance training;Neuromuscular re-education;Patient/family education;Manual techniques;Scar mobilization;Passive range of motion;Dry needling;Vasopneumatic Device;Vestibular;Joint Manipulations    PT Next Visit Plan check goals plan for d/c    PT Home Exercise Plan Access Code: [email or web address]    Consulted and Agree with Plan of Care Patient           Patient will benefit from skilled therapeutic intervention in order to improve the following deficits and impairments:  Abnormal gait,Decreased balance,Decreased endurance,Decreased mobility,Decreased range of motion,Decreased scar mobility,Decreased strength,Increased edema,Impaired flexibility,Pain  Visit Diagnosis: Muscle weakness (generalized)  Other abnormalities of gait and mobility  Pain in right ankle and joints of right foot  Stiffness of right ankle, not elsewhere classified  Localized edema  Unsteadiness on feet     Problem List Patient Active Problem List   Diagnosis Date Noted  . Encounter for general adult medical examination with abnormal findings 03/12/2020  . Stage 3a chronic kidney disease ([place]) 03/12/2020  . Gastroesophageal reflux disease without esophagitis 02/13/2019  . Screening for cervical cancer 02/13/2019  . Visit for screening mammogram 02/13/2019  . Vitamin D deficiency disease 10/05/2017  . Obesity 06/08/2016  . DDD (degenerative disc disease), lumbar 05/21/2014  . Essential hypertension 05/21/2014      Laureen Abrahams, PT, DPT 06/03/20 8:43 AM    [facility] Physical Therapy [address] [place], [place], [address] Phone: [phone]   Fax:  [phone]  Name: Lyzette Reinhardt MRN: [id number] Date of Birth: [birth year]

## 2020-06-04 ENCOUNTER — Other Ambulatory Visit: Payer: Self-pay | Admitting: Internal Medicine

## 2020-06-04 DIAGNOSIS — I10 Essential (primary) hypertension: Secondary | ICD-10-CM

## 2020-06-05 ENCOUNTER — Ambulatory Visit (INDEPENDENT_AMBULATORY_CARE_PROVIDER_SITE_OTHER): Payer: BC Managed Care – PPO | Admitting: Physical Therapy

## 2020-06-05 ENCOUNTER — Other Ambulatory Visit: Payer: Self-pay

## 2020-06-05 ENCOUNTER — Encounter: Payer: Self-pay | Admitting: Physical Therapy

## 2020-06-05 DIAGNOSIS — M25571 Pain in right ankle and joints of right foot: Secondary | ICD-10-CM

## 2020-06-05 DIAGNOSIS — R2689 Other abnormalities of gait and mobility: Secondary | ICD-10-CM

## 2020-06-05 DIAGNOSIS — M6281 Muscle weakness (generalized): Secondary | ICD-10-CM

## 2020-06-05 DIAGNOSIS — R2681 Unsteadiness on feet: Secondary | ICD-10-CM

## 2020-06-05 DIAGNOSIS — R600 Localized edema: Secondary | ICD-10-CM

## 2020-06-05 DIAGNOSIS — M25671 Stiffness of right ankle, not elsewhere classified: Secondary | ICD-10-CM | POA: Diagnosis not present

## 2020-06-05 NOTE — Therapy (Addendum)
[facility] Physical Therapy [address] [place], [place], [address] Phone: [phone]   Fax:  [phone]  Physical Therapy Treatment/Discharge addendum PHYSICAL THERAPY DISCHARGE SUMMARY  Visits from Start of Care: 44  Current functional level related to goals / functional outcomes: See below   Remaining deficits: See below   Education / Equipment: HEP  Plan:                                                    Patient goals were partially met. Patient is being discharged due to                                                     ?????  Upon review of MD notes, she will have another surgery for hardware removal to her ankle. We will close out this episode of care and will begin a new one post op if MD recommends further PT after her surgery.  Elsie Ra, PT, DPT 07/03/20 10:42 AM      Patient Details  Name: Haylo Fake MRN: [id number] Date of Birth: 11-Oct-1960 Referring Provider (PT): Bevely Palmer Persons, [place]   Encounter Date: 06/05/2020   PT End of Session - 06/05/20 0841    Visit Number 44    Number of Visits 50   updated based on recert on 5/37/4827   Date for PT Re-Evaluation 06/26/20    Authorization Type [employer] Comm PPO    Authorization - Visit Number 21   starting 03/16/20   Authorization - Number of Visits 30    Progress Note Due on Visit 48    PT Start Time 0801    PT Stop Time 0846    PT Time Calculation (min) 45 min    Activity Tolerance Patient limited by pain;Patient tolerated treatment well    Behavior During Therapy [facility] for tasks assessed/performed           Past Medical History:  Diagnosis Date  . Ankle fracture    right  . DDD (degenerative disc disease), lumbar   . Gall stones   . Hypertension     Past Surgical History:  Procedure Laterality Date  . ANKLE FUSION Left 07/21/2017   Procedure: SUBTALAR AND TALONAVICULAR FUSION LEFT FOOT;  Surgeon: Newt Minion, MD;  Location: [place];  Service: Orthopedics;   Laterality: Left;  . AXILLARY LYMPH NODE DISSECTION Left 11/24/2012   Procedure: incision and drainage axillary abscess;  Surgeon: Adin Hector, MD;  Location: [place];  Service: General;  Laterality: Left;  . BREAST SURGERY  2012   breast biopsy  . CHOLECYSTECTOMY    . ORIF ANKLE FRACTURE Right 11/22/2019   Procedure: OPEN REDUCTION INTERNAL FIXATION (ORIF) RIGHT ANKLE FRACTURE;  Surgeon: Newt Minion, MD;  Location: [place];  Service: Orthopedics;  Laterality: Right;  . TUBAL LIGATION      There were no vitals filed for this visit.   Subjective Assessment - 06/05/20 0804    Subjective doing well  - ankle is the same    Pertinent History 11/22/2019 right ankle trimalleolar fx w/ORIF, left subtalar & talonavicular fusion 07/21/17, DDD, HTN    How long can you sit  comfortably? 20 minutes    How long can you stand comfortably? 45 minutes before having to take a break    How long can you walk comfortably? 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 No/denies    Pain Onset More than a month ago              [facility] PT Assessment - 06/05/20 0001      Assessment   Medical Diagnosis Right Trimalleolar Fracture ORIF    Referring Provider (PT) Bevely Palmer Persons, PA    Onset Date/Surgical Date 11/22/19      Observation/Other Assessments   Focus on Therapeutic Outcomes (FOTO)  64%      Ambulation/Gait   Ambulation/Gait Yes    Ambulation/Gait Assistance 7: Independent    Stairs Yes    Stairs Assistance 6: Modified independent (Device/Increase time)    Stair Management Technique No rails;Step to pattern;Alternating pattern;Forwards    Number of Stairs 10    Height of Stairs 8    Ramp 7: Independent    Curb 7: Independent                         [facility] Adult PT Treatment/Exercise - 06/05/20 0807       Vasopneumatic   Number Minutes Vasopneumatic  10 minutes    Vasopnuematic Location  Ankle    Vasopneumatic Pressure High    Vasopneumatic Temperature  34      Ankle Exercises: Aerobic   Nustep L4 x 8 min      Ankle Exercises: Standing   SLS RLE 2-4 seconds x 15 reps; on foam 5x15 sec with intermittent UE support    Other Standing Ankle Exercises squat with heel raises and 10# KB; 2x10      Ankle Exercises: Stretches   Slant Board Stretch 3 reps;30 seconds   gastroc and soleus     Ankle Exercises: Seated   Other Seated Ankle Exercises seated PF L5 and L4 band 3X10                    PT Short Term Goals - 05/15/20 0917      PT SHORT TERM GOAL #1   Title RLE single leg stance >5 sec.    Baseline MET 03/26/2020    Status Achieved    Target Date 03/27/20      PT SHORT TERM GOAL #2   Title Pt ambulates on indoor surfaces without assistive device with proper step width with cues only.    Baseline MET 03/26/2020    Status Achieved    Target Date 04/20/20             PT Long Term Goals - 06/05/20 0841      PT LONG TERM GOAL #1   Title Patient demonstrates & verbalizes understanding of ongoing HEP.    Baseline now met    Time 2    Period Months    Status Achieved      PT LONG TERM GOAL #2   Title FOTO score >/= 60% functional or <40% limited    Baseline 59% updated score    Time 2    Period Months    Status Achieved      PT LONG TERM GOAL #3   Title  right ankle range within 10* of left ankle range.    Baseline patient is within 10 degrees of L ankle motion    Status Achieved      PT LONG TERM GOAL #4   Title Patient ambulates >500' and negotiates ramps, curbs & stairs without device independently.    Baseline can step up without UE to 6'' step, trouble going down, curbs, ramps, walking not much trouble at all    Status Achieved      PT LONG TERM GOAL #5   Title Right single leg stance >10seconds    Baseline 7 seconds at best    Status Not Met       PT LONG TERM GOAL #6   Title right ankle & foot pain </= 2/10 with standing & gait activities    Baseline still working towards pain reduced standing and walking especially on concrete for more than 15 minutes    Status Not Met                 Plan - 06/05/20 0842    Clinical Impression Statement Pt has met/partially met nearly all LTGs and anticipate she's ready for d/c from PT.  Continued difficulty with pain and SLS balance which is addressed with HEP.  Recommend she follow up with Dr. Sharol Given next week but anticipate d/c.    Personal Factors and Comorbidities Comorbidity 3+    Comorbidities left subtalar & talonavicular fusion 07/21/17, DDD, HTN    Examination-Activity Limitations Lift;Locomotion Level;Squat;Stairs;Sit;Stand    Examination-Participation Musician;Occupation    Stability/Clinical Decision Making Stable/Uncomplicated    Rehab Potential Good    PT Frequency 2x / week    PT Duration Other (comment)   9 weeks   PT Treatment/Interventions ADLs/Self Care Home Management;Electrical Stimulation;Cryotherapy;DME Instruction;Gait training;Stair training;Contrast Bath;Functional mobility training;Therapeutic activities;Therapeutic exercise;Balance training;Neuromuscular re-education;Patient/family education;Manual techniques;Scar mobilization;Passive range of motion;Dry needling;Vasopneumatic Device;Vestibular;Joint Manipulations    PT Next Visit Plan see what MD says, plan for d/c    PT Home Exercise Plan Access Code: [email or web address]    Consulted and Agree with Plan of Care Patient           Patient will benefit from skilled therapeutic intervention in order to improve the following deficits and impairments:  Abnormal gait,Decreased balance,Decreased endurance,Decreased mobility,Decreased range of motion,Decreased scar mobility,Decreased strength,Increased edema,Impaired flexibility,Pain  Visit Diagnosis: Muscle weakness (generalized)  Other  abnormalities of gait and mobility  Pain in right ankle and joints of right foot  Stiffness of right ankle, not elsewhere classified  Localized edema  Unsteadiness on feet     Problem List Patient Active Problem List   Diagnosis Date Noted  . Encounter for general adult medical examination with abnormal findings 03/12/2020  . Stage 3a chronic kidney disease ([place]) 03/12/2020  . Gastroesophageal reflux disease without esophagitis 02/13/2019  . Screening for cervical cancer 02/13/2019  . Visit for screening mammogram 02/13/2019  . Vitamin D deficiency disease 10/05/2017  . Obesity 06/08/2016  . DDD (degenerative disc disease), lumbar 05/21/2014  . Essential hypertension 05/21/2014    Faustino Congress 06/05/2020, 8:44 AM  [facility] Physical Therapy [address] [place], [place], [address] Phone: [phone]   Fax:  [phone]  Name: Ryla Cauthon MRN: [id number] Date of Birth: [birth year]

## 2020-06-10 ENCOUNTER — Ambulatory Visit (INDEPENDENT_AMBULATORY_CARE_PROVIDER_SITE_OTHER): Payer: No Typology Code available for payment source | Admitting: Orthopedic Surgery

## 2020-06-10 ENCOUNTER — Encounter: Payer: Self-pay | Admitting: Orthopedic Surgery

## 2020-06-10 ENCOUNTER — Ambulatory Visit: Payer: Self-pay

## 2020-06-10 VITALS — Ht 66.0 in | Wt 205.0 lb

## 2020-06-10 DIAGNOSIS — M25571 Pain in right ankle and joints of right foot: Secondary | ICD-10-CM

## 2020-06-10 DIAGNOSIS — Z8781 Personal history of (healed) traumatic fracture: Secondary | ICD-10-CM | POA: Diagnosis not present

## 2020-06-10 DIAGNOSIS — Z9889 Other specified postprocedural states: Secondary | ICD-10-CM

## 2020-06-10 NOTE — Progress Notes (Signed)
Office Visit Note   Patient: Jamie Jenkins           Date of Birth: 06-03-1960           MRN: [id number] Visit Date: 06/10/2020              Requested by: Janith Lima, MD [address] [place],  [place] [address] PCP: Janith Lima, MD  Chief Complaint  Patient presents with  . Right Ankle - Follow-up    ORIF Right Bimalleolar ankle fracture 11/22/2019      HPI: Patient is a 60 year old woman who is 6 months status post open reduction internal fixation bimalleolar right ankle fracture.  Patient states that range of motion of the ankle is pain-free but she has pain over the fibular hardware.  Patient denies any history of ulcers or drainage.  She is not on pain medication.  Assessment & Plan: Visit Diagnoses:  1. S/P ORIF (open reduction internal fixation) fracture     Plan: Due to her pain from the deep retained hardware patient would like to proceed with hardware removal we will set this up as an outpatient surgery risk and benefits were discussed including infection and persistent pain.  Patient states she understands and wishes to proceed at this time.  Follow-Up Instructions: No follow-ups on file.   Ortho Exam  Patient is alert, oriented, no adenopathy, well-dressed, normal affect, normal respiratory effort. Examination patient has good ankle and subtalar motion.  She does have some hypertrophy of the scar she is point tender to palpation over the lateral hardware.  There are no ulcers no skin breakdown no cellulitis.  Imaging: XR Ankle Complete Right  Result Date: 06/10/2020 Three-view radiographs of the right ankle shows stable internal fixation of a bimalleolar fracture no hardware lucency or failure  No images are attached to the encounter.  Labs: Lab Results  Component Value Date   HGBA1C 5.5 02/13/2019   HGBA1C 5.6 10/05/2017   HGBA1C 5.5 06/23/2016     Lab Results  Component Value Date   ALBUMIN 3.7 10/05/2017   ALBUMIN 3.9 07/16/2017    ALBUMIN 4.3 06/23/2016    No results found for: MG Lab Results  Component Value Date   VD25OH 20.10 (L) 03/11/2020   VD25OH 20.39 (L) 02/13/2019   VD25OH 7.43 (L) 10/05/2017    No results found for: PREALBUMIN CBC EXTENDED Latest Ref Rng & Units 11/22/2019 02/13/2019 10/05/2017  WBC 4.0 - 10.5 K/uL 7.5 6.6 5.7  RBC 3.87 - 5.11 MIL/uL 4.38 4.49 3.94  HGB 12.0 - 15.0 g/dL 14.8 14.6 13.3  HCT 36.0 - 46.0 % 44.6 43.2 39.6  PLT 150 - 400 K/uL 251 249.0 238.0  NEUTROABS 1.4 - 7.7 K/uL - 3.6 2.9  LYMPHSABS 0.7 - 4.0 K/uL - 2.5 2.4     Body mass index is 33.09 kg/m.  Orders:  Orders Placed This Encounter  Procedures  . XR Ankle Complete Right   No orders of the defined types were placed in this encounter.    Procedures: No procedures performed  Clinical Data: No additional findings.  ROS:  All other systems negative, except as noted in the HPI. Review of Systems  Objective: Vital Signs: Ht 5\' 6"  (1.676 m)   Wt 205 lb (93 kg)   LMP 08/23/2012   BMI 33.09 kg/m   Specialty Comments:  No specialty comments available.  PMFS History: Patient Active Problem List   Diagnosis Date Noted  . Encounter for general  adult medical examination with abnormal findings 03/12/2020  . Stage 3a chronic kidney disease ([place]) 03/12/2020  . Gastroesophageal reflux disease without esophagitis 02/13/2019  . Screening for cervical cancer 02/13/2019  . Visit for screening mammogram 02/13/2019  . Vitamin D deficiency disease 10/05/2017  . Obesity 06/08/2016  . DDD (degenerative disc disease), lumbar 05/21/2014  . Essential hypertension 05/21/2014   Past Medical History:  Diagnosis Date  . Ankle fracture    right  . DDD (degenerative disc disease), lumbar   . Gall stones   . Hypertension     Family History  Problem Relation Age of Onset  . Pancreatic cancer Mother        Deceased  . Hypertension Father   . Other Father        stomach aneurysm  . Deep vein thrombosis Brother         Deceased    Past Surgical History:  Procedure Laterality Date  . ANKLE FUSION Left 07/21/2017   Procedure: SUBTALAR AND TALONAVICULAR FUSION LEFT FOOT;  Surgeon: Newt Minion, MD;  Location: [place];  Service: Orthopedics;  Laterality: Left;  . AXILLARY LYMPH NODE DISSECTION Left 11/24/2012   Procedure: incision and drainage axillary abscess;  Surgeon: Adin Hector, MD;  Location: [place];  Service: General;  Laterality: Left;  . BREAST SURGERY  2012   breast biopsy  . CHOLECYSTECTOMY    . ORIF ANKLE FRACTURE Right 11/22/2019   Procedure: OPEN REDUCTION INTERNAL FIXATION (ORIF) RIGHT ANKLE FRACTURE;  Surgeon: Newt Minion, MD;  Location: [place];  Service: Orthopedics;  Laterality: Right;  . TUBAL LIGATION     Social History   Occupational History  . Occupation: Scientist, research (medical)  Tobacco Use  . Smoking status: Never Smoker  . Smokeless tobacco: Never Used  Vaping Use  . Vaping Use: Never used  Substance and Sexual Activity  . Alcohol use: Not Currently    Alcohol/week: 0.0 standard drinks  . Drug use: No  . Sexual activity: Not Currently    Partners: Male    Birth control/protection: Post-menopausal

## 2020-06-27 NOTE — Progress Notes (Deleted)
GYNECOLOGY  VISIT  CC:   ***  HPI: 60 y.o. G3P0 Married Jamie Jenkins American female here for toc of trichomonas.     GYNECOLOGIC HISTORY: Patient's last menstrual period was 08/23/2012. Contraception: *** Menopausal hormone therapy: ***  Patient Active Problem List   Diagnosis Date Noted  . Encounter for general adult medical examination with abnormal findings 03/12/2020  . Stage 3a chronic kidney disease ([place]) 03/12/2020  . Gastroesophageal reflux disease without esophagitis 02/13/2019  . Screening for cervical cancer 02/13/2019  . Visit for screening mammogram 02/13/2019  . Vitamin D deficiency disease 10/05/2017  . Obesity 06/08/2016  . DDD (degenerative disc disease), lumbar 05/21/2014  . Essential hypertension 05/21/2014    Past Medical History:  Diagnosis Date  . Ankle fracture    right  . DDD (degenerative disc disease), lumbar   . Gall stones   . Hypertension     Past Surgical History:  Procedure Laterality Date  . ANKLE FUSION Left 07/21/2017   Procedure: SUBTALAR AND TALONAVICULAR FUSION LEFT FOOT;  Surgeon: Newt Minion, MD;  Location: [facility];  Service: Orthopedics;  Laterality: Left;  . AXILLARY LYMPH NODE DISSECTION Left 11/24/2012   Procedure: incision and drainage axillary abscess;  Surgeon: Adin Hector, MD;  Location: [facility];  Service: General;  Laterality: Left;  . BREAST SURGERY  2012   breast biopsy  . CHOLECYSTECTOMY    . ORIF ANKLE FRACTURE Right 11/22/2019   Procedure: OPEN REDUCTION INTERNAL FIXATION (ORIF) RIGHT ANKLE FRACTURE;  Surgeon: Newt Minion, MD;  Location: [facility];  Service: Orthopedics;  Laterality: Right;  . TUBAL LIGATION      MEDS:   Current Outpatient Medications on File Prior to Visit  Medication Sig Dispense Refill  . Cholecalciferol 50 MCG (2000 UT) TABS Take 1 tablet (2,000 Units total) by mouth daily. 90 tablet 1  . indapamide (LOZOL) 2.5 MG tablet TAKE 1 TABLET(2.5 MG) BY MOUTH DAILY 90 tablet 0  . metroNIDAZOLE  (FLAGYL) 500 MG tablet Take 1 tablet (500 mg total) by mouth 2 (two) times daily. 14 tablet 0  . oxyCODONE-acetaminophen (PERCOCET) 5-325 MG tablet Take 1 tablet by mouth every 8 (eight) hours as needed. 30 tablet 0   No current facility-administered medications on file prior to visit.    ALLERGIES: Patient has no known allergies.  Family History  Problem Relation Age of Onset  . Pancreatic cancer Mother        Deceased  . Hypertension Father   . Other Father        stomach aneurysm  . Deep vein thrombosis Brother        Deceased     Review of Systems  PHYSICAL EXAMINATION:    LMP 08/23/2012     General appearance: alert, cooperative, no acute distress  CV:  {Exam; heart brief:31539} Lungs:  {pe lungs ob:314451::"clear to auscultation, no wheezes, rales or rhonchi, symmetric air entry"} Breasts: {Exam; breast:13139::"normal appearance, no masses or tenderness"} Abdomen: soft, non-tender; no masses,  no organomegaly Lymph:  no inguinal LAD noted  Pelvic: External genitalia:  no lesions              Urethra:  normal appearing urethra with no masses, tenderness or lesions              Bartholins and Skenes: normal                 Vagina: normal appearing vagina  Cervix: {CHL AMB PHY EX CERVIX NORM DEFAULT:[phone]::"no lesions"}              Bimanual Exam:  Uterus:  {CHL AMB PHY EX UTERUS NORM [phone]::"normal size, contour, position, consistency, mobility, non-tender"}              Adnexa: {CHL AMB PHY EX ADNEXA NO MASS DEFAULT:[phone]::"no mass, fullness, tenderness"}               Chaperone, ***, CMA, was present for exam.  Assessment: ***  Plan: ***   {NUMBERS; -10-45 JOINT ROM:10287} minutes of total time was spent for this patient encounter, including preparation, face-to-face counseling with the patient and coordination of care, and documentation of the encounter.

## 2020-07-02 ENCOUNTER — Other Ambulatory Visit: Payer: Self-pay

## 2020-07-02 ENCOUNTER — Ambulatory Visit: Payer: BC Managed Care – PPO | Admitting: Nurse Practitioner

## 2020-08-09 ENCOUNTER — Other Ambulatory Visit: Payer: Self-pay

## 2020-08-13 ENCOUNTER — Other Ambulatory Visit: Payer: Self-pay | Admitting: Physician Assistant

## 2020-08-14 ENCOUNTER — Encounter (HOSPITAL_COMMUNITY): Payer: Self-pay | Admitting: Orthopedic Surgery

## 2020-08-14 ENCOUNTER — Other Ambulatory Visit (HOSPITAL_COMMUNITY)
Admission: RE | Admit: 2020-08-14 | Discharge: 2020-08-14 | Disposition: A | Discharge status: Home / Self Care | Payer: No Typology Code available for payment source | Source: Ambulatory Visit | Attending: Orthopedic Surgery | Admitting: Orthopedic Surgery

## 2020-08-14 DIAGNOSIS — Z20822 Contact with and (suspected) exposure to covid-19: Secondary | ICD-10-CM | POA: Diagnosis not present

## 2020-08-14 DIAGNOSIS — Z01812 Encounter for preprocedural laboratory examination: Secondary | ICD-10-CM | POA: Insufficient documentation

## 2020-08-14 NOTE — Progress Notes (Signed)
EKG: 11/22/19 CXR: 05/20/17 ECHO: 06/11/15 Stress Test: denies Cardiac Cath: denies  Fasting Blood Sugar- na Checks Blood Sugar__na_ times a day  OSA/CPAP: No  ASA/Blood Thinners: No  covid test 08/14/20 pending  Anesthesia Review: No  Patient denies shortness of breath, fever, cough, and chest pain at PAT appointment.  Patient verbalized understanding of instructions provided today at the PAT appointment.  Patient asked to review instructions at home and day of surgery.

## 2020-08-15 LAB — SARS CORONAVIRUS 2 (TAT 6-24 HRS): SARS Coronavirus 2: NEGATIVE

## 2020-08-16 ENCOUNTER — Encounter (HOSPITAL_COMMUNITY)
Admission: RE | Disposition: A | Discharge status: Home / Self Care | Payer: Self-pay | Source: Home / Self Care | Attending: Orthopedic Surgery

## 2020-08-16 ENCOUNTER — Other Ambulatory Visit: Payer: Self-pay

## 2020-08-16 ENCOUNTER — Ambulatory Visit (HOSPITAL_COMMUNITY)
Admission: RE | Admit: 2020-08-16 | Discharge: 2020-08-16 | Disposition: A | Discharge status: Home / Self Care | Payer: No Typology Code available for payment source | Attending: Orthopedic Surgery | Admitting: Orthopedic Surgery

## 2020-08-16 ENCOUNTER — Ambulatory Visit (HOSPITAL_COMMUNITY): Payer: No Typology Code available for payment source | Admitting: Anesthesiology

## 2020-08-16 ENCOUNTER — Encounter (HOSPITAL_COMMUNITY): Payer: Self-pay | Admitting: Orthopedic Surgery

## 2020-08-16 DIAGNOSIS — Z981 Arthrodesis status: Secondary | ICD-10-CM | POA: Insufficient documentation

## 2020-08-16 DIAGNOSIS — T85848S Pain due to other internal prosthetic devices, implants and grafts, sequela: Secondary | ICD-10-CM

## 2020-08-16 DIAGNOSIS — T8484XA Pain due to internal orthopedic prosthetic devices, implants and grafts, initial encounter: Secondary | ICD-10-CM | POA: Insufficient documentation

## 2020-08-16 DIAGNOSIS — T85848A Pain due to other internal prosthetic devices, implants and grafts, initial encounter: Secondary | ICD-10-CM

## 2020-08-16 DIAGNOSIS — M25571 Pain in right ankle and joints of right foot: Secondary | ICD-10-CM | POA: Diagnosis not present

## 2020-08-16 DIAGNOSIS — Y831 Surgical operation with implant of artificial internal device as the cause of abnormal reaction of the patient, or of later complication, without mention of misadventure at the time of the procedure: Secondary | ICD-10-CM | POA: Insufficient documentation

## 2020-08-16 HISTORY — PX: HARDWARE REMOVAL: SHX979

## 2020-08-16 LAB — BASIC METABOLIC PANEL
Anion gap: 11 (ref 5–15)
BUN: 11 mg/dL (ref 6–20)
CO2: 23 mmol/L (ref 22–32)
Calcium: 8.7 mg/dL — ABNORMAL LOW (ref 8.9–10.3)
Chloride: 104 mmol/L (ref 98–111)
Creatinine, Ser: 0.71 mg/dL (ref 0.44–1.00)
GFR, Estimated: >60 mL/min (ref >60)
Glucose, Bld: 100 mg/dL — ABNORMAL HIGH (ref 70–99)
Potassium: 4 mmol/L (ref 3.5–5.1)
Sodium: 138 mmol/L (ref 135–145)

## 2020-08-16 SURGERY — REMOVAL, HARDWARE
Anesthesia: General | Laterality: Right

## 2020-08-16 MED ORDER — BUPIVACAINE HCL 0.25 % IJ SOLN
INTRAMUSCULAR | Status: DC | PRN
  Administered 2020-08-16: 30 mL

## 2020-08-16 MED ORDER — FENTANYL CITRATE (PF) 100 MCG/2ML IJ SOLN
INTRAMUSCULAR | Status: AC
  Filled 2020-08-16: qty 2

## 2020-08-16 MED ORDER — CHLORHEXIDINE GLUCONATE 0.12 % MT SOLN: 15.0000 mL | Freq: Once | OROMUCOSAL | Status: AC

## 2020-08-16 MED ORDER — CEFAZOLIN SODIUM-DEXTROSE 2-4 GM/100ML-% IV SOLN
2.0000 g | INTRAVENOUS | Status: AC
  Administered 2020-08-16: 2 g via INTRAVENOUS
  Filled 2020-08-16: qty 100

## 2020-08-16 MED ORDER — HYDROMORPHONE HCL 1 MG/ML IJ SOLN
INTRAMUSCULAR | Status: AC
  Filled 2020-08-16: qty 1

## 2020-08-16 MED ORDER — OXYCODONE-ACETAMINOPHEN 5-325 MG PO TABS: 1.0000 | ORAL_TABLET | ORAL | 0 refills | Status: DC | PRN

## 2020-08-16 MED ORDER — ORAL CARE MOUTH RINSE: 15.0000 mL | Freq: Once | OROMUCOSAL | Status: AC

## 2020-08-16 MED ORDER — MIDAZOLAM HCL 2 MG/2ML IJ SOLN
INTRAMUSCULAR | Status: AC
  Filled 2020-08-16: qty 2

## 2020-08-16 MED ORDER — PROPOFOL 10 MG/ML IV BOLUS
INTRAVENOUS | Status: DC | PRN
  Administered 2020-08-16: 140 mg via INTRAVENOUS

## 2020-08-16 MED ORDER — ONDANSETRON HCL 4 MG/2ML IJ SOLN
INTRAMUSCULAR | Status: DC | PRN
  Administered 2020-08-16: 4 mg via INTRAVENOUS

## 2020-08-16 MED ORDER — ONDANSETRON HCL 4 MG/2ML IJ SOLN
INTRAMUSCULAR | Status: AC
  Filled 2020-08-16: qty 2

## 2020-08-16 MED ORDER — OXYCODONE HCL 5 MG PO TABS
5.0000 mg | ORAL_TABLET | Freq: Once | ORAL | Status: AC | PRN
  Administered 2020-08-16: 5 mg via ORAL

## 2020-08-16 MED ORDER — 0.9 % SODIUM CHLORIDE (POUR BTL) OPTIME
TOPICAL | Status: DC | PRN
  Administered 2020-08-16: 1000 mL

## 2020-08-16 MED ORDER — BUPIVACAINE HCL (PF) 0.25 % IJ SOLN
INTRAMUSCULAR | Status: AC
  Filled 2020-08-16: qty 30

## 2020-08-16 MED ORDER — DEXAMETHASONE SODIUM PHOSPHATE 10 MG/ML IJ SOLN
INTRAMUSCULAR | Status: AC
  Filled 2020-08-16: qty 1

## 2020-08-16 MED ORDER — BUPIVACAINE-EPINEPHRINE (PF) 0.25% -1:200000 IJ SOLN
INTRAMUSCULAR | Status: AC
  Filled 2020-08-16: qty 30

## 2020-08-16 MED ORDER — LIDOCAINE 2% (20 MG/ML) 5 ML SYRINGE
INTRAMUSCULAR | Status: DC | PRN
  Administered 2020-08-16: 60 mg via INTRAVENOUS

## 2020-08-16 MED ORDER — CHLORHEXIDINE GLUCONATE 0.12 % MT SOLN
OROMUCOSAL | Status: AC
  Administered 2020-08-16: 15 mL via OROMUCOSAL
  Filled 2020-08-16: qty 15

## 2020-08-16 MED ORDER — OXYCODONE HCL 5 MG/5ML PO SOLN: 5.0000 mg | Freq: Once | ORAL | Status: AC | PRN

## 2020-08-16 MED ORDER — PROPOFOL 10 MG/ML IV BOLUS
INTRAVENOUS | Status: AC
  Filled 2020-08-16: qty 20

## 2020-08-16 MED ORDER — OXYCODONE HCL 5 MG PO TABS
ORAL_TABLET | ORAL | Status: AC
  Filled 2020-08-16: qty 1

## 2020-08-16 MED ORDER — DEXAMETHASONE SODIUM PHOSPHATE 10 MG/ML IJ SOLN
INTRAMUSCULAR | Status: DC | PRN
  Administered 2020-08-16: 10 mg via INTRAVENOUS

## 2020-08-16 MED ORDER — MIDAZOLAM HCL 5 MG/5ML IJ SOLN
INTRAMUSCULAR | Status: DC | PRN
  Administered 2020-08-16: 2 mg via INTRAVENOUS

## 2020-08-16 MED ORDER — LACTATED RINGERS IV SOLN: INTRAVENOUS | Status: DC

## 2020-08-16 MED ORDER — FENTANYL CITRATE (PF) 250 MCG/5ML IJ SOLN
INTRAMUSCULAR | Status: AC
  Filled 2020-08-16: qty 5

## 2020-08-16 MED ORDER — HYDROMORPHONE HCL 1 MG/ML IJ SOLN
0.2500 mg | INTRAMUSCULAR | Status: DC | PRN
  Administered 2020-08-16: 0.25 mg via INTRAVENOUS

## 2020-08-16 MED ORDER — FENTANYL CITRATE (PF) 100 MCG/2ML IJ SOLN
INTRAMUSCULAR | Status: DC | PRN
  Administered 2020-08-16 (×2): 50 ug via INTRAVENOUS

## 2020-08-16 MED ORDER — EPHEDRINE SULFATE-NACL 50-0.9 MG/10ML-% IV SOSY
PREFILLED_SYRINGE | INTRAVENOUS | Status: DC | PRN
  Administered 2020-08-16: 10 mg via INTRAVENOUS

## 2020-08-16 SURGICAL SUPPLY — 45 items
BANDAGE ESMARK 6X9 LF (GAUZE/BANDAGES/DRESSINGS) IMPLANT
BNDG CMPR 9X6 STRL LF SNTH (GAUZE/BANDAGES/DRESSINGS)
BNDG COHESIVE 4X5 TAN STRL (GAUZE/BANDAGES/DRESSINGS) ×2 IMPLANT
BNDG ESMARK 6X9 LF (GAUZE/BANDAGES/DRESSINGS)
BNDG GAUZE ELAST 4 BULKY (GAUZE/BANDAGES/DRESSINGS) ×2 IMPLANT
COVER SURGICAL LIGHT HANDLE (MISCELLANEOUS) ×4 IMPLANT
COVER WAND RF STERILE (DRAPES) ×2 IMPLANT
CUFF TOURN SGL QUICK 34 (TOURNIQUET CUFF)
CUFF TOURN SGL QUICK 42 (TOURNIQUET CUFF) IMPLANT
CUFF TRNQT CYL 34X4.125X (TOURNIQUET CUFF) IMPLANT
DRAPE C-ARM 42X72 X-RAY (DRAPES) IMPLANT
DRAPE INCISE IOBAN 66X45 STRL (DRAPES) IMPLANT
DRAPE ORTHO SPLIT 77X108 STRL (DRAPES)
DRAPE SURG ORHT 6 SPLT 77X108 (DRAPES) IMPLANT
DRAPE U-SHAPE 47X51 STRL (DRAPES) ×2 IMPLANT
DRSG EMULSION OIL 3X3 NADH (GAUZE/BANDAGES/DRESSINGS) ×2 IMPLANT
DRSG PAD ABDOMINAL 8X10 ST (GAUZE/BANDAGES/DRESSINGS) ×2 IMPLANT
DURAPREP 26ML APPLICATOR (WOUND CARE) ×2 IMPLANT
ELECT REM PT RETURN 9FT ADLT (ELECTROSURGICAL) ×2
ELECTRODE REM PT RTRN 9FT ADLT (ELECTROSURGICAL) ×1 IMPLANT
GAUZE SPONGE 4X4 12PLY STRL (GAUZE/BANDAGES/DRESSINGS) ×2 IMPLANT
GLOVE BIOGEL PI IND STRL 9 (GLOVE) ×1 IMPLANT
GLOVE BIOGEL PI INDICATOR 9 (GLOVE) ×1
GLOVE SURG ORTHO 9.0 STRL STRW (GLOVE) ×2 IMPLANT
GOWN STRL REUS W/ TWL XL LVL3 (GOWN DISPOSABLE) ×3 IMPLANT
GOWN STRL REUS W/TWL XL LVL3 (GOWN DISPOSABLE) ×6
KIT BASIN OR (CUSTOM PROCEDURE TRAY) ×2 IMPLANT
KIT TURNOVER KIT B (KITS) ×2 IMPLANT
MANIFOLD NEPTUNE II (INSTRUMENTS) ×2 IMPLANT
NS IRRIG 1000ML POUR BTL (IV SOLUTION) ×2 IMPLANT
PACK ORTHO EXTREMITY (CUSTOM PROCEDURE TRAY) ×2 IMPLANT
PAD ABD 7.5X8 STRL (GAUZE/BANDAGES/DRESSINGS) ×2 IMPLANT
PAD ARMBOARD 7.5X6 YLW CONV (MISCELLANEOUS) ×4 IMPLANT
SPONGE LAP 18X18 RF (DISPOSABLE) IMPLANT
STAPLER VISISTAT 35W (STAPLE) IMPLANT
STOCKINETTE IMPERVIOUS 9X36 MD (GAUZE/BANDAGES/DRESSINGS) IMPLANT
SUT ETHILON 2 0 PSLX (SUTURE) IMPLANT
SUT VIC AB 0 CT1 27 (SUTURE)
SUT VIC AB 0 CT1 27XBRD ANBCTR (SUTURE) IMPLANT
SUT VIC AB 2-0 CT1 27 (SUTURE)
SUT VIC AB 2-0 CT1 TAPERPNT 27 (SUTURE) IMPLANT
TOWEL GREEN STERILE (TOWEL DISPOSABLE) ×2 IMPLANT
TOWEL GREEN STERILE FF (TOWEL DISPOSABLE) ×2 IMPLANT
UNDERPAD 30X36 HEAVY ABSORB (UNDERPADS AND DIAPERS) ×2 IMPLANT
WATER STERILE IRR 1000ML POUR (IV SOLUTION) ×2 IMPLANT

## 2020-08-16 NOTE — Anesthesia Postprocedure Evaluation (Signed)
Anesthesia Post Note  Patient: Jamie Jenkins  Procedure(s) Performed: RIGHT ANKLE REMOVAL FIBULA HARDWARE (Right )     Patient location during evaluation: PACU Anesthesia Type: General Level of consciousness: awake and alert and oriented Pain management: pain level controlled Vital Signs Assessment: post-procedure vital signs reviewed and stable Respiratory status: spontaneous breathing, nonlabored ventilation and respiratory function stable Cardiovascular status: blood pressure returned to baseline and stable Postop Assessment: no apparent nausea or vomiting Anesthetic complications: no   No complications documented.  Last Vitals:  Vitals:   08/16/20 1030 08/16/20 1045  BP: 124/84 127/70  Pulse: (!) 52 68  Resp: 12 13  Temp:    SpO2: 100% 100%    Last Pain:  Vitals:   08/16/20 1010  TempSrc:   PainSc: 7                  Cadince Hilscher A.

## 2020-08-16 NOTE — Op Note (Signed)
08/16/2020  10:14 AM  PATIENT:  Jamie Jenkins    PRE-OPERATIVE DIAGNOSIS:  Painful Hardware Right Fibula  POST-OPERATIVE DIAGNOSIS:  Same  PROCEDURE:  RIGHT ANKLE REMOVAL FIBULA HARDWARE  SURGEON:  Newt Minion, MD  PHYSICIAN ASSISTANT:None ANESTHESIA:   General  PREOPERATIVE INDICATIONS:  Jamie Jenkins is a  60 y.o. female with a diagnosis of Painful Hardware Right Fibula who failed conservative measures and elected for surgical management.    The risks benefits and alternatives were discussed with the patient preoperatively including but not limited to the risks of infection, bleeding, nerve injury, cardiopulmonary complications, the need for revision surgery, among others, and the patient was willing to proceed.  OPERATIVE IMPLANTS: none  @ENCIMAGES @  OPERATIVE FINDINGS: No signs of infection bone well-healed all hardware removed from the fibula.  OPERATIVE PROCEDURE: Was brought to the room and underwent a general anesthetic.  After adequate anesthesia were obtained patient's right lower extremity was using DuraPrep draped into a sterile field a timeout was called.  Incision was made over the previous lateral incision this was carried down to the hardware.  A periosteal elevator was used to remove Outerbridge over the plate.  The 6 screws and plate were removed without complications [place] no further debridement of the fibrinous tissue over the fibula was removed.  Wound was irrigated with normal saline incision was closed using 2-0 nylon, patient underwent local anesthetic with the 20 cc of half percent Marcaine plain.  A sterile dressing was applied patient was extubated taken to PACU in stable condition   DISCHARGE PLANNING:  Antibiotic duration: Preoperative antibiotics  Weightbearing: Weightbearing as tolerated  Pain medication: Prescription for Percocet  Dressing care/ Wound VAC: Dry dressing change in 5 days  Ambulatory devices: Weight bearing as tolerated  Walker or crutches  Discharge to: home  Follow-up: In the office 1 week post operative.

## 2020-08-16 NOTE — Interval H&P Note (Signed)
History and Physical Interval Note:  08/16/2020 9:04 AM  Jamie Jenkins  has presented today for surgery, with the diagnosis of Painful Hardware Right Fibula.  The various methods of treatment have been discussed with the patient and family. After consideration of risks, benefits and other options for treatment, the patient has consented to  Procedure(s): RIGHT ANKLE REMOVAL FIBULA HARDWARE (Right) as a surgical intervention.  The patient's history has been reviewed, patient examined, no change in status, stable for surgery.  I have reviewed the patient's chart and labs.  Questions were answered to the patient's satisfaction.     Newt Minion

## 2020-08-16 NOTE — H&P (Signed)
Jamie Jenkins is an 60 y.o. female.   Chief Complaint: Right Ankle painful hardware HPI: Patient is a 60 year old woman who is 6 months status post open reduction internal fixation bimalleolar right ankle fracture.  Patient states that range of motion of the ankle is pain-free but she has pain over the fibular hardware.  Patient denies any history of ulcers or drainage.  She is not on pain medication.  Past Medical History:  Diagnosis Date  . Ankle fracture    right  . DDD (degenerative disc disease), lumbar   . Gall stones   . Hypertension     Past Surgical History:  Procedure Laterality Date  . ANKLE FUSION Left 07/21/2017   Procedure: SUBTALAR AND TALONAVICULAR FUSION LEFT FOOT;  Surgeon: Jamie Minion, MD;  Location: [facility];  Service: Orthopedics;  Laterality: Left;  . AXILLARY LYMPH NODE DISSECTION Left 11/24/2012   Procedure: incision and drainage axillary abscess;  Surgeon: Jamie Hector, MD;  Location: [facility];  Service: General;  Laterality: Left;  . BREAST SURGERY  2012   breast biopsy  . CHOLECYSTECTOMY    . ORIF ANKLE FRACTURE Right 11/22/2019   Procedure: OPEN REDUCTION INTERNAL FIXATION (ORIF) RIGHT ANKLE FRACTURE;  Surgeon: Jamie Minion, MD;  Location: [facility];  Service: Orthopedics;  Laterality: Right;  . TUBAL LIGATION      Family History  Problem Relation Age of Onset  . Pancreatic cancer Mother        Deceased  . Hypertension Father   . Other Father        stomach aneurysm  . Deep vein thrombosis Brother        Deceased   Social History:  reports that she has never smoked. She has never used smokeless tobacco. She reports previous alcohol use. She reports that she does not use drugs.  Allergies: No Known Allergies  No medications prior to admission.    Results for orders placed or performed during the hospital encounter of 08/14/20 (from the past 48 hour(s))  SARS CORONAVIRUS 2 (TAT 6-24 HRS) Nasopharyngeal Nasopharyngeal Swab     Status: None    Collection Time: 08/14/20  1:11 PM   Specimen: Nasopharyngeal Swab  Result Value Ref Range   SARS Coronavirus 2 NEGATIVE NEGATIVE    Comment: (NOTE) SARS-CoV-2 target nucleic acids are NOT DETECTED.  The SARS-CoV-2 RNA is generally detectable in upper and lower respiratory specimens during the acute phase of infection. Negative results do not preclude SARS-CoV-2 infection, do not rule out co-infections with other pathogens, and should not be used as the sole basis for treatment or other patient management decisions. Negative results must be combined with clinical observations, patient history, and epidemiological information. The expected result is Negative.  Fact Sheet for Patients: SugarRoll.be  Fact Sheet for Healthcare Providers: https://www.woods-mathews.com/  This test is not yet approved or cleared by the [place] FDA and  has been authorized for detection and/or diagnosis of SARS-CoV-2 by FDA under an Emergency Use Authorization (EUA). This EUA will remain  in effect (meaning this test can be used) for the duration of the COVID-19 declaration under Se ction 564(b)(1) of the Act, 21 U.S.C. section 360bbb-3(b)(1), unless the authorization is terminated or revoked sooner.  Performed at [facility], [place] [address], [place], [place] [address]    No results found.  Review of Systems  All other systems reviewed and are negative.   Last menstrual period 08/23/2012. Physical Exam   Patient is  alert, oriented, no adenopathy, well-dressed, normal affect, normal respiratory effort. Examination patient has good ankle and subtalar motion.  She does have some hypertrophy of the scar she is point tender to palpation over the lateral hardware.  There are no ulcers no skin breakdown no cellulitis. Heart RRR Lungs Clear Assessment/Plan 1. S/P ORIF (open reduction internal fixation) fracture     Plan: Due to her pain from the  deep retained hardware patient would like to proceed with hardware removal we will set this up as an outpatient surgery risk and benefits were discussed including infection and persistent pain.  Patient states she understands and wishes to proceed at this time.   Jamie Jenkins Jamie Latin, PA 08/16/2020, 6:28 AM

## 2020-08-16 NOTE — Anesthesia Procedure Notes (Signed)
Procedure Name: LMA Insertion Date/Time: 08/16/2020 9:32 AM Performed by: Gwyndolyn Saxon, CRNA Pre-anesthesia Checklist: Patient identified, Emergency Drugs available, Suction available and Patient being monitored Patient Re-evaluated:Patient Re-evaluated prior to induction Oxygen Delivery Method: Circle system utilized Preoxygenation: Pre-oxygenation with 100% oxygen Induction Type: IV induction Ventilation: Mask ventilation without difficulty LMA: LMA inserted LMA Size: 4.0 Number of attempts: 1 Placement Confirmation: positive ETCO2 and breath sounds checked- equal and bilateral Tube secured with: Tape Dental Injury: Teeth and Oropharynx as per pre-operative assessment

## 2020-08-16 NOTE — Anesthesia Preprocedure Evaluation (Addendum)
Anesthesia Evaluation  Patient identified by MRN, date of birth, ID band Patient awake    Reviewed: Allergy & Precautions, NPO status , Patient's Chart, lab work & pertinent test results  History of Anesthesia Complications Negative for: history of anesthetic complications  Airway Mallampati: II  TM Distance: >3 FB Neck ROM: Full    Dental  (+) Dental Advisory Given, Poor Dentition, Missing, Chipped   Pulmonary neg pulmonary ROS,    Pulmonary exam normal breath sounds clear to auscultation       Cardiovascular hypertension, Pt. on medications Normal cardiovascular exam Rhythm:Regular Rate:Normal     Neuro/Psych negative neurological ROS     GI/Hepatic Neg liver ROS, GERD  Medicated,  Endo/Other  negative endocrine ROS  Renal/GU Renal InsufficiencyRenal disease     Musculoskeletal  (+) Arthritis , Osteoarthritis,  Painful hardware right fibula DDD lumbar spine   Abdominal   Peds  Hematology negative hematology ROS (+)   Anesthesia Other Findings   Reproductive/Obstetrics                             Anesthesia Physical  Anesthesia Plan  ASA: III  Anesthesia Plan: General   Post-op Pain Management:  Regional for Post-op pain   Induction: Intravenous  PONV Risk Score and Plan: 3 and Ondansetron, Dexamethasone, Midazolam and Treatment may vary due to age or medical condition  Airway Management Planned: LMA  Additional Equipment:   Intra-op Plan:   Post-operative Plan: Extubation in OR  Informed Consent: I have reviewed the patients History and Physical, chart, labs and discussed the procedure including the risks, benefits and alternatives for the proposed anesthesia with the patient or authorized representative who has indicated his/her understanding and acceptance.     Dental advisory given  Plan Discussed with: Anesthesiologist and CRNA  Anesthesia Plan Comments:          Anesthesia Quick Evaluation

## 2020-08-16 NOTE — Transfer of Care (Signed)
Immediate Anesthesia Transfer of Care Note  Patient: Jamie Jenkins  Procedure(s) Performed: RIGHT ANKLE REMOVAL FIBULA HARDWARE (Right )  Patient Location: PACU  Anesthesia Type:General  Level of Consciousness: awake, alert  and oriented  Airway & Oxygen Therapy: Patient Spontanous Breathing  Post-op Assessment: Report given to RN and Post -op Vital signs reviewed and stable  Post vital signs: Reviewed and stable  Last Vitals:  Vitals Value Taken Time  BP    Temp    Pulse    Resp    SpO2      Last Pain:  Vitals:   08/16/20 0802  TempSrc:   PainSc: 0-No pain         Complications: No complications documented.

## 2020-08-17 ENCOUNTER — Encounter (HOSPITAL_COMMUNITY): Payer: Self-pay | Admitting: Orthopedic Surgery

## 2020-08-28 ENCOUNTER — Other Ambulatory Visit: Payer: Self-pay | Admitting: Physician Assistant

## 2020-08-28 ENCOUNTER — Ambulatory Visit (INDEPENDENT_AMBULATORY_CARE_PROVIDER_SITE_OTHER): Payer: BC Managed Care – PPO | Admitting: Physician Assistant

## 2020-08-28 ENCOUNTER — Telehealth: Payer: Self-pay

## 2020-08-28 ENCOUNTER — Encounter: Payer: Self-pay | Admitting: Physician Assistant

## 2020-08-28 ENCOUNTER — Other Ambulatory Visit: Payer: Self-pay

## 2020-08-28 DIAGNOSIS — Z8781 Personal history of (healed) traumatic fracture: Secondary | ICD-10-CM

## 2020-08-28 DIAGNOSIS — Z9889 Other specified postprocedural states: Secondary | ICD-10-CM

## 2020-08-28 MED ORDER — OXYCODONE-ACETAMINOPHEN 5-325 MG PO TABS: 1.0000 | ORAL_TABLET | ORAL | 0 refills | Status: DC | PRN

## 2020-08-28 NOTE — Telephone Encounter (Signed)
Patient called she is requesting a rx refill for oxycodone call back:[phone]

## 2020-08-28 NOTE — Progress Notes (Signed)
Office Visit Note   Patient: Jamie Jenkins           Date of Birth: 03/03/1961           MRN: [id number] Visit Date: 08/28/2020              Requested by: Janith Lima, MD [address] [place],  [place] [address] PCP: Janith Lima, MD  Chief Complaint  Patient presents with   Right Ankle - Follow-up      HPI: Patient presents today 13 days status post removal of hardware from her right ankle.  She is overall slowly improving.  She does have a history of hypersensitivity over the ankle.  She has been putting some type of cream over the ankle that was recommended to her  Assessment & Plan: Visit Diagnoses: No diagnosis found.  Plan: Asked that she only use antibacterial soap and water.  Will return next week for removal of surgical sutures.  She is very apprehensive about this as her ankle is very sensitive and has been since surgery  Follow-Up Instructions: No follow-ups on file.   Ortho Exam  Patient is alert, oriented, no adenopathy, well-dressed, normal affect, normal respiratory effort.  Right ankle overall well apposed wound edges at the very distal end of the wound she has a 3 mm x 1 mm dehiscence.  There is no drainage there is no surrounding erythema no ascending cellulitis.  Swelling is very well controlled.  Pulses are palpable compartments are soft and nontender  Imaging: No results found. No images are attached to the encounter.  Labs: Lab Results  Component Value Date   HGBA1C 5.5 02/13/2019   HGBA1C 5.6 10/05/2017   HGBA1C 5.5 06/23/2016     Lab Results  Component Value Date   ALBUMIN 3.7 10/05/2017   ALBUMIN 3.9 07/16/2017   ALBUMIN 4.3 06/23/2016    No results found for: MG Lab Results  Component Value Date   VD25OH 20.10 (L) 03/11/2020   VD25OH 20.39 (L) 02/13/2019   VD25OH 7.43 (L) 10/05/2017    No results found for: PREALBUMIN CBC EXTENDED Latest Ref Rng & Units 11/22/2019 02/13/2019 10/05/2017  WBC 4.0 - 10.5 K/uL 7.5 6.6 5.7   RBC 3.87 - 5.11 MIL/uL 4.38 4.49 3.94  HGB 12.0 - 15.0 g/dL 14.8 14.6 13.3  HCT 36.0 - 46.0 % 44.6 43.2 39.6  PLT 150 - 400 K/uL 251 249.0 238.0  NEUTROABS 1.4 - 7.7 K/uL - 3.6 2.9  LYMPHSABS 0.7 - 4.0 K/uL - 2.5 2.4     There is no height or weight on file to calculate BMI.  Orders:  No orders of the defined types were placed in this encounter.  No orders of the defined types were placed in this encounter.    Procedures: No procedures performed  Clinical Data: No additional findings.  ROS:  All other systems negative, except as noted in the HPI. Review of Systems  Objective: Vital Signs: LMP 08/23/2012   Specialty Comments:  No specialty comments available.  PMFS History: Patient Active Problem List   Diagnosis Date Noted   Pain from implanted hardware    Encounter for general adult medical examination with abnormal findings 03/12/2020   Stage 3a chronic kidney disease ([place]) 03/12/2020   Gastroesophageal reflux disease without esophagitis 02/13/2019   Screening for cervical cancer 02/13/2019   Visit for screening mammogram 02/13/2019   Vitamin D deficiency disease 10/05/2017   Obesity 06/08/2016   DDD (degenerative disc  disease), lumbar 05/21/2014   Essential hypertension 05/21/2014   Past Medical History:  Diagnosis Date   Ankle fracture    right   DDD (degenerative disc disease), lumbar    Gall stones    Hypertension     Family History  Problem Relation Age of Onset   Pancreatic cancer Mother        Deceased   Hypertension Father    Other Father        stomach aneurysm   Deep vein thrombosis Brother        Deceased    Past Surgical History:  Procedure Laterality Date   ANKLE FUSION Left 07/21/2017   Procedure: SUBTALAR AND TALONAVICULAR FUSION LEFT FOOT;  Surgeon: Newt Minion, MD;  Location: [facility];  Service: Orthopedics;  Laterality: Left;   AXILLARY LYMPH NODE DISSECTION Left 11/24/2012   Procedure: incision and drainage axillary abscess;   Surgeon: Adin Hector, MD;  Location: [facility];  Service: General;  Laterality: Left;   BREAST SURGERY  2012   breast biopsy   CHOLECYSTECTOMY     HARDWARE REMOVAL Right 08/16/2020   Procedure: RIGHT ANKLE REMOVAL FIBULA HARDWARE;  Surgeon: Newt Minion, MD;  Location: [facility];  Service: Orthopedics;  Laterality: Right;   ORIF ANKLE FRACTURE Right 11/22/2019   Procedure: OPEN REDUCTION INTERNAL FIXATION (ORIF) RIGHT ANKLE FRACTURE;  Surgeon: Newt Minion, MD;  Location: [facility];  Service: Orthopedics;  Laterality: Right;   TUBAL LIGATION     Social History   Occupational History   Occupation: Scientist, research (medical)  Tobacco Use   Smoking status: Never   Smokeless tobacco: Never  Vaping Use   Vaping Use: Never used  Substance and Sexual Activity   Alcohol use: Not Currently    Alcohol/week: 0.0 standard drinks   Drug use: No   Sexual activity: Not Currently    Partners: Male    Birth control/protection: Post-menopausal

## 2020-08-28 NOTE — Telephone Encounter (Signed)
Pt was evaluated in the office today and is asking for refill on her Oxycodone please advise.

## 2020-08-28 NOTE — Telephone Encounter (Signed)
done

## 2020-09-02 ENCOUNTER — Other Ambulatory Visit: Payer: Self-pay | Admitting: Internal Medicine

## 2020-09-02 DIAGNOSIS — I10 Essential (primary) hypertension: Secondary | ICD-10-CM

## 2020-09-06 ENCOUNTER — Ambulatory Visit (INDEPENDENT_AMBULATORY_CARE_PROVIDER_SITE_OTHER): Payer: BC Managed Care – PPO | Admitting: Physician Assistant

## 2020-09-06 ENCOUNTER — Encounter: Payer: Self-pay | Admitting: Physician Assistant

## 2020-09-06 DIAGNOSIS — Z9889 Other specified postprocedural states: Secondary | ICD-10-CM

## 2020-09-06 DIAGNOSIS — Z8781 Personal history of (healed) traumatic fracture: Secondary | ICD-10-CM

## 2020-09-06 NOTE — Progress Notes (Signed)
Office Visit Note   Patient: Jamie Jenkins           Date of Birth: 1960-09-01           MRN: [id number] Visit Date: 09/06/2020              Requested by: Janith Lima, MD [address] [place],  [place] [address] PCP: Janith Lima, MD  Chief Complaint  Patient presents with   Right Ankle - Routine Post Op    // right ankle removal of HDW fib       HPI: Patient is a pleasant 60 year old woman who is 3 weeks status post removal of hardware from her right ankle.  She overall is doing well.  She admits she has always had some hypersensitivity over the incision since her first surgery.  She is apprehensive about having her sutures removed today  Assessment & Plan: Visit Diagnoses: No diagnosis found.  Plan: Spoke with the patient's she may work with vitamin e- based oil or scar cream and should do desensitization and soft tissue massage of this wound.  Follow-up for 1 month.  I did offer her physical therapy but she declined at this time  Follow-Up Instructions: No follow-ups on file.   Ortho Exam  Patient is alert, oriented, no adenopathy, well-dressed, normal affect, normal respiratory effort. Right ankle well-healed surgical incision swelling is well controlled no surrounding erythema no drainage no ascending cellulitis.  Sutures were removed without difficulty  Imaging: No results found. No images are attached to the encounter.  Labs: Lab Results  Component Value Date   HGBA1C 5.5 02/13/2019   HGBA1C 5.6 10/05/2017   HGBA1C 5.5 06/23/2016     Lab Results  Component Value Date   ALBUMIN 3.7 10/05/2017   ALBUMIN 3.9 07/16/2017   ALBUMIN 4.3 06/23/2016    No results found for: MG Lab Results  Component Value Date   VD25OH 20.10 (L) 03/11/2020   VD25OH 20.39 (L) 02/13/2019   VD25OH 7.43 (L) 10/05/2017    No results found for: PREALBUMIN CBC EXTENDED Latest Ref Rng & Units 11/22/2019 02/13/2019 10/05/2017  WBC 4.0 - 10.5 K/uL 7.5 6.6 5.7  RBC 3.87  - 5.11 MIL/uL 4.38 4.49 3.94  HGB 12.0 - 15.0 g/dL 14.8 14.6 13.3  HCT 36.0 - 46.0 % 44.6 43.2 39.6  PLT 150 - 400 K/uL 251 249.0 238.0  NEUTROABS 1.4 - 7.7 K/uL - 3.6 2.9  LYMPHSABS 0.7 - 4.0 K/uL - 2.5 2.4     There is no height or weight on file to calculate BMI.  Orders:  No orders of the defined types were placed in this encounter.  No orders of the defined types were placed in this encounter.    Procedures: No procedures performed  Clinical Data: No additional findings.  ROS:  All other systems negative, except as noted in the HPI. Review of Systems  Objective: Vital Signs: LMP 08/23/2012   Specialty Comments:  No specialty comments available.  PMFS History: Patient Active Problem List   Diagnosis Date Noted   Pain from implanted hardware    Encounter for general adult medical examination with abnormal findings 03/12/2020   Stage 3a chronic kidney disease ([place]) 03/12/2020   Gastroesophageal reflux disease without esophagitis 02/13/2019   Screening for cervical cancer 02/13/2019   Visit for screening mammogram 02/13/2019   Vitamin D deficiency disease 10/05/2017   Obesity 06/08/2016   DDD (degenerative disc disease), lumbar 05/21/2014   Essential hypertension  05/21/2014   Past Medical History:  Diagnosis Date   Ankle fracture    right   DDD (degenerative disc disease), lumbar    Gall stones    Hypertension     Family History  Problem Relation Age of Onset   Pancreatic cancer Mother        Deceased   Hypertension Father    Other Father        stomach aneurysm   Deep vein thrombosis Brother        Deceased    Past Surgical History:  Procedure Laterality Date   ANKLE FUSION Left 07/21/2017   Procedure: SUBTALAR AND TALONAVICULAR FUSION LEFT FOOT;  Surgeon: Newt Minion, MD;  Location: [facility];  Service: Orthopedics;  Laterality: Left;   AXILLARY LYMPH NODE DISSECTION Left 11/24/2012   Procedure: incision and drainage axillary abscess;  Surgeon:  Adin Hector, MD;  Location: [facility];  Service: General;  Laterality: Left;   BREAST SURGERY  2012   breast biopsy   CHOLECYSTECTOMY     HARDWARE REMOVAL Right 08/16/2020   Procedure: RIGHT ANKLE REMOVAL FIBULA HARDWARE;  Surgeon: Newt Minion, MD;  Location: [facility];  Service: Orthopedics;  Laterality: Right;   ORIF ANKLE FRACTURE Right 11/22/2019   Procedure: OPEN REDUCTION INTERNAL FIXATION (ORIF) RIGHT ANKLE FRACTURE;  Surgeon: Newt Minion, MD;  Location: [facility];  Service: Orthopedics;  Laterality: Right;   TUBAL LIGATION     Social History   Occupational History   Occupation: Scientist, research (medical)  Tobacco Use   Smoking status: Never   Smokeless tobacco: Never  Vaping Use   Vaping Use: Never used  Substance and Sexual Activity   Alcohol use: Not Currently    Alcohol/week: 0.0 standard drinks   Drug use: No   Sexual activity: Not Currently    Partners: Male    Birth control/protection: Post-menopausal

## 2020-09-21 DIAGNOSIS — H5210 Myopia, unspecified eye: Secondary | ICD-10-CM | POA: Diagnosis not present

## 2020-10-04 ENCOUNTER — Encounter: Payer: Self-pay | Admitting: Physician Assistant

## 2020-10-04 ENCOUNTER — Other Ambulatory Visit: Payer: Self-pay

## 2020-10-04 ENCOUNTER — Ambulatory Visit (INDEPENDENT_AMBULATORY_CARE_PROVIDER_SITE_OTHER): Payer: BC Managed Care – PPO | Admitting: Physician Assistant

## 2020-10-04 DIAGNOSIS — Z9889 Other specified postprocedural states: Secondary | ICD-10-CM

## 2020-10-04 DIAGNOSIS — Z8781 Personal history of (healed) traumatic fracture: Secondary | ICD-10-CM

## 2020-10-04 NOTE — Progress Notes (Signed)
Office Visit Note   Patient: Jamie Jenkins           Date of Birth: [birth year]           MRN: [id number] Visit Date: 10/04/2020              Requested by: Janith Lima, MD [address] [place],  [place] [address] PCP: Janith Lima, MD  Chief Complaint  Patient presents with   Right Ankle - Routine Post Op, Follow-up      HPI: Patient presents in follow-up she is 7 weeks status post removal of hardware from her right ankle.  She is overall doing well.  His her only concern is the appearance of the scar.  She has not had any drainage in several weeks  Assessment & Plan: Visit Diagnoses: No diagnosis found.  Plan: I explained to the patient that the scar will continue to develop over the next 10 months or so.  She should keep it out of the sun.  She also understands that when you have a repeat procedure with the same incision it can take a bit longer to heal.  I do not see any signs of concern or infection  Follow-Up Instructions: No follow-ups on file.   Ortho Exam  Patient is alert, oriented, no adenopathy, well-dressed, normal affect, normal respiratory effort. Examination pulses are palpable.  She has some mild keloiding of the scar but no erythema no cellulitis no signs of infection is completely healed swelling is well controlled ankle range of motion is fairly painless  Imaging: No results found. No images are attached to the encounter.  Labs: Lab Results  Component Value Date   HGBA1C 5.5 02/13/2019   HGBA1C 5.6 10/05/2017   HGBA1C 5.5 06/23/2016     Lab Results  Component Value Date   ALBUMIN 3.7 10/05/2017   ALBUMIN 3.9 07/16/2017   ALBUMIN 4.3 06/23/2016    No results found for: MG Lab Results  Component Value Date   VD25OH 20.10 (L) 03/11/2020   VD25OH 20.39 (L) 02/13/2019   VD25OH 7.43 (L) 10/05/2017    No results found for: PREALBUMIN CBC EXTENDED Latest Ref Rng & Units 11/22/2019 02/13/2019 10/05/2017  WBC 4.0 - 10.5 K/uL 7.5 6.6  5.7  RBC 3.87 - 5.11 MIL/uL 4.38 4.49 3.94  HGB 12.0 - 15.0 g/dL 14.8 14.6 13.3  HCT 36.0 - 46.0 % 44.6 43.2 39.6  PLT 150 - 400 K/uL 251 249.0 238.0  NEUTROABS 1.4 - 7.7 K/uL - 3.6 2.9  LYMPHSABS 0.7 - 4.0 K/uL - 2.5 2.4     There is no height or weight on file to calculate BMI.  Orders:  No orders of the defined types were placed in this encounter.  No orders of the defined types were placed in this encounter.    Procedures: No procedures performed  Clinical Data: No additional findings.  ROS:  All other systems negative, except as noted in the HPI. Review of Systems  Objective: Vital Signs: LMP 08/23/2012   Specialty Comments:  No specialty comments available.  PMFS History: Patient Active Problem List   Diagnosis Date Noted   Pain from implanted hardware    Encounter for general adult medical examination with abnormal findings 03/12/2020   Stage 3a chronic kidney disease ([place]) 03/12/2020   Gastroesophageal reflux disease without esophagitis 02/13/2019   Screening for cervical cancer 02/13/2019   Visit for screening mammogram 02/13/2019   Vitamin D deficiency disease 10/05/2017  Obesity 06/08/2016   DDD (degenerative disc disease), lumbar 05/21/2014   Essential hypertension 05/21/2014   Past Medical History:  Diagnosis Date   Ankle fracture    right   DDD (degenerative disc disease), lumbar    Gall stones    Hypertension     Family History  Problem Relation Age of Onset   Pancreatic cancer Mother        Deceased   Hypertension Father    Other Father        stomach aneurysm   Deep vein thrombosis Brother        Deceased    Past Surgical History:  Procedure Laterality Date   ANKLE FUSION Left 07/21/2017   Procedure: SUBTALAR AND TALONAVICULAR FUSION LEFT FOOT;  Surgeon: Newt Minion, MD;  Location: [facility];  Service: Orthopedics;  Laterality: Left;   AXILLARY LYMPH NODE DISSECTION Left 11/24/2012   Procedure: incision and drainage axillary  abscess;  Surgeon: Adin Hector, MD;  Location: [facility];  Service: General;  Laterality: Left;   BREAST SURGERY  2012   breast biopsy   CHOLECYSTECTOMY     HARDWARE REMOVAL Right 08/16/2020   Procedure: RIGHT ANKLE REMOVAL FIBULA HARDWARE;  Surgeon: Newt Minion, MD;  Location: [facility];  Service: Orthopedics;  Laterality: Right;   ORIF ANKLE FRACTURE Right 11/22/2019   Procedure: OPEN REDUCTION INTERNAL FIXATION (ORIF) RIGHT ANKLE FRACTURE;  Surgeon: Newt Minion, MD;  Location: [facility];  Service: Orthopedics;  Laterality: Right;   TUBAL LIGATION     Social History   Occupational History   Occupation: Scientist, research (medical)  Tobacco Use   Smoking status: Never   Smokeless tobacco: Never  Vaping Use   Vaping Use: Never used  Substance and Sexual Activity   Alcohol use: Not Currently    Alcohol/week: 0.0 standard drinks   Drug use: No   Sexual activity: Not Currently    Partners: Male    Birth control/protection: Post-menopausal

## 2020-10-14 ENCOUNTER — Other Ambulatory Visit: Payer: Self-pay | Admitting: Internal Medicine

## 2020-10-14 DIAGNOSIS — I10 Essential (primary) hypertension: Secondary | ICD-10-CM

## 2021-01-08 ENCOUNTER — Telehealth: Payer: Self-pay | Admitting: Internal Medicine

## 2021-01-08 DIAGNOSIS — I10 Essential (primary) hypertension: Secondary | ICD-10-CM

## 2021-01-20 ENCOUNTER — Other Ambulatory Visit: Payer: Self-pay | Admitting: Internal Medicine

## 2021-01-20 DIAGNOSIS — I10 Essential (primary) hypertension: Secondary | ICD-10-CM

## 2021-01-20 NOTE — Telephone Encounter (Signed)
1.Medication Requested: indapamide (LOZOL) 2.5 MG tablet  2. Pharmacy (Name, Street, [place]): [facility] [phone] - [address]  Phone:  [phone] Fax:  [phone]   3. On Med List: yes  4. Last Visit with PCP: 12.27.21  5. Next visit date with PCP: 12.13.22   Agent: Please be advised that RX refills may take up to 3 business days. We ask that you follow-up with your pharmacy.

## 2021-01-21 ENCOUNTER — Other Ambulatory Visit: Payer: Self-pay | Admitting: Internal Medicine

## 2021-01-21 DIAGNOSIS — I10 Essential (primary) hypertension: Secondary | ICD-10-CM

## 2021-01-21 MED ORDER — INDAPAMIDE 2.5 MG PO TABS: 2.5000 mg | ORAL_TABLET | Freq: Every morning | ORAL | 0 refills | Status: DC

## 2021-01-21 NOTE — Telephone Encounter (Signed)
Pt informed 30d supply sent

## 2021-01-21 NOTE — Telephone Encounter (Signed)
Patient calling in to check status of refill request  Patient says she is completely out of medication  Advised patient of 3BD turn around time

## 2021-02-25 ENCOUNTER — Encounter: Payer: Self-pay | Admitting: Internal Medicine

## 2021-02-25 ENCOUNTER — Ambulatory Visit: Payer: BC Managed Care – PPO | Admitting: Internal Medicine

## 2021-02-25 ENCOUNTER — Other Ambulatory Visit: Payer: Self-pay

## 2021-02-25 VITALS — BP 144/88 | HR 66 | Temp 98.2°F | Resp 16 | Ht 66.0 in | Wt 213.0 lb

## 2021-02-25 DIAGNOSIS — K219 Gastro-esophageal reflux disease without esophagitis: Secondary | ICD-10-CM

## 2021-02-25 DIAGNOSIS — N1831 Chronic kidney disease, stage 3a: Secondary | ICD-10-CM | POA: Diagnosis not present

## 2021-02-25 DIAGNOSIS — Z0001 Encounter for general adult medical examination with abnormal findings: Secondary | ICD-10-CM

## 2021-02-25 DIAGNOSIS — Z1231 Encounter for screening mammogram for malignant neoplasm of breast: Secondary | ICD-10-CM

## 2021-02-25 DIAGNOSIS — E2839 Other primary ovarian failure: Secondary | ICD-10-CM | POA: Diagnosis not present

## 2021-02-25 DIAGNOSIS — I10 Essential (primary) hypertension: Secondary | ICD-10-CM | POA: Diagnosis not present

## 2021-02-25 DIAGNOSIS — Z1211 Encounter for screening for malignant neoplasm of colon: Secondary | ICD-10-CM | POA: Insufficient documentation

## 2021-02-25 LAB — CBC WITH DIFFERENTIAL/PLATELET
Basophils Absolute: 0 10*3/uL (ref 0.0–0.1)
Basophils Relative: 0.2 % (ref 0.0–3.0)
Eosinophils Absolute: 0.1 10*3/uL (ref 0.0–0.7)
Eosinophils Relative: 0.8 % (ref 0.0–5.0)
HCT: 41.1 % (ref 36.0–46.0)
Hemoglobin: 14.2 g/dL (ref 12.0–15.0)
Lymphocytes Relative: 32.8 % (ref 12.0–46.0)
Lymphs Abs: 2.7 10*3/uL (ref 0.7–4.0)
MCHC: 34.7 g/dL (ref 30.0–36.0)
MCV: 97.8 fl (ref 78.0–100.0)
Monocytes Absolute: 0.5 10*3/uL (ref 0.1–1.0)
Monocytes Relative: 6.5 % (ref 3.0–12.0)
Neutro Abs: 4.9 10*3/uL (ref 1.4–7.7)
Neutrophils Relative %: 59.7 % (ref 43.0–77.0)
Platelets: 257 10*3/uL (ref 150.0–400.0)
RBC: 4.2 Mil/uL (ref 3.87–5.11)
RDW: 13.3 % (ref 11.5–15.5)
WBC: 8.2 10*3/uL (ref 4.0–10.5)

## 2021-02-25 LAB — URINALYSIS, ROUTINE W REFLEX MICROSCOPIC
Bilirubin Urine: NEGATIVE
Hgb urine dipstick: NEGATIVE
Ketones, ur: NEGATIVE
Nitrite: NEGATIVE
RBC / HPF: NONE SEEN (ref 0–?)
Specific Gravity, Urine: 1.015 (ref 1.000–1.030)
Total Protein, Urine: NEGATIVE
Urine Glucose: NEGATIVE
Urobilinogen, UA: 0.2 (ref 0.0–1.0)
pH: 7.5 (ref 5.0–8.0)

## 2021-02-25 LAB — LIPID PANEL
Cholesterol: 143 mg/dL (ref 0–200)
HDL: 56.3 mg/dL (ref >39.00)
LDL Cholesterol: 56 mg/dL (ref 0–99)
NonHDL: 87.03
Total CHOL/HDL Ratio: 3
Triglycerides: 155 mg/dL — ABNORMAL HIGH (ref 0.0–149.0)
VLDL: 31 mg/dL (ref 0.0–40.0)

## 2021-02-25 LAB — BASIC METABOLIC PANEL
BUN: 9 mg/dL (ref 6–23)
CO2: 33 mEq/L — ABNORMAL HIGH (ref 19–32)
Calcium: 9.4 mg/dL (ref 8.4–10.5)
Chloride: 99 mEq/L (ref 96–112)
Creatinine, Ser: 0.95 mg/dL (ref 0.40–1.20)
GFR: 65.09 mL/min (ref >60.00)
Glucose, Bld: 93 mg/dL (ref 70–99)
Potassium: 3.6 mEq/L (ref 3.5–5.1)
Sodium: 138 mEq/L (ref 135–145)

## 2021-02-25 LAB — HEPATIC FUNCTION PANEL
ALT: 15 U/L (ref 0–35)
AST: 17 U/L (ref 0–37)
Albumin: 4.1 g/dL (ref 3.5–5.2)
Alkaline Phosphatase: 106 U/L (ref 39–117)
Bilirubin, Direct: 0.1 mg/dL (ref 0.0–0.3)
Total Bilirubin: 0.3 mg/dL (ref 0.2–1.2)
Total Protein: 7.5 g/dL (ref 6.0–8.3)

## 2021-02-25 LAB — TSH: TSH: 2.29 u[IU]/mL (ref 0.35–5.50)

## 2021-02-25 MED ORDER — INDAPAMIDE 2.5 MG PO TABS: 2.5000 mg | ORAL_TABLET | Freq: Every morning | ORAL | 1 refills | Status: DC

## 2021-02-25 NOTE — Patient Instructions (Signed)

## 2021-02-25 NOTE — Progress Notes (Signed)
Subjective:  Patient ID: Jamie Jenkins, female    DOB: 1960/09/17  Age: 60 y.o. MRN: [id number]  CC: Annual Exam and Hypertension  This visit occurred during the SARS-CoV-2 public health emergency.  Safety protocols were in place, including screening questions prior to the visit, additional usage of staff PPE, and extensive cleaning of exam room while observing appropriate contact time as indicated for disinfecting solutions.    HPI Belvie Iribe presents for a CPX -  She has not been taking indapamide every day nor has she been exercising.  When she is active she does not experience chest pain, shortness of breath, diaphoresis, edema, dizziness, lightheadedness, or fatigue.  Outpatient Medications Prior to Visit  Medication Sig Dispense Refill   BIOTIN PO Take 2 tablets by mouth in the morning.     indapamide (LOZOL) 2.5 MG tablet Take 1 tablet (2.5 mg total) by mouth in the morning. 30 tablet 0   oxyCODONE-acetaminophen (PERCOCET) 5-325 MG tablet Take 1 tablet by mouth every 4 (four) hours as needed. 30 tablet 0   No facility-administered medications prior to visit.    ROS Review of Systems  Constitutional:  Positive for unexpected weight change (wt gain). Negative for appetite change, chills, diaphoresis and fatigue.  HENT: Negative.    Eyes: Negative.   Respiratory:  Negative for cough, chest tightness, shortness of breath and wheezing.   Cardiovascular:  Negative for chest pain, palpitations and leg swelling.  Gastrointestinal:  Negative for abdominal pain, constipation, diarrhea, nausea and vomiting.  Endocrine: Negative.   Genitourinary: Negative.  Negative for difficulty urinating.  Musculoskeletal:  Positive for arthralgias. Negative for back pain and myalgias.  Skin: Negative.  Negative for color change.  Neurological:  Negative for dizziness, weakness, light-headedness and headaches.  Hematological:  Negative for adenopathy. Does not bruise/bleed easily.   Psychiatric/Behavioral: Negative.     Objective:  BP (!) 144/88 (BP Location: Right Arm, Patient Position: Sitting, Cuff Size: Large)    Pulse 66    Temp 98.2 F (36.8 C) (Oral)    Resp 16    Ht 5\' 6"  (1.676 m)    Wt 213 lb (96.6 kg)    LMP 08/23/2012    SpO2 98%    BMI 34.38 kg/m   BP Readings from Last 3 Encounters:  02/25/21 (!) 144/88  08/16/20 114/74  04/02/20 118/80    Wt Readings from Last 3 Encounters:  02/25/21 213 lb (96.6 kg)  08/16/20 225 lb (102.1 kg)  06/10/20 205 lb (93 kg)    Physical Exam Vitals reviewed.  Constitutional:      Appearance: Normal appearance.  HENT:     Nose: Nose normal.     Mouth/Throat:     Mouth: Mucous membranes are moist.  Eyes:     General: No scleral icterus.    Conjunctiva/sclera: Conjunctivae normal.  Cardiovascular:     Rate and Rhythm: Normal rate and regular rhythm.     Heart sounds: No murmur heard. Pulmonary:     Effort: Pulmonary effort is normal.     Breath sounds: No stridor. No wheezing, rhonchi or rales.  Abdominal:     General: Abdomen is flat.     Palpations: There is no mass.     Tenderness: There is no abdominal tenderness. There is no guarding.     Hernia: No hernia is present.  Musculoskeletal:        General: Normal range of motion.     Cervical back: Neck supple.  Right lower leg: No edema.     Left lower leg: No edema.  Lymphadenopathy:     Cervical: No cervical adenopathy.  Skin:    General: Skin is warm and dry.  Neurological:     General: No focal deficit present.     Mental Status: She is alert.  Psychiatric:        Mood and Affect: Mood normal.        Behavior: Behavior normal.    Lab Results  Component Value Date   WBC 8.2 02/25/2021   HGB 14.2 02/25/2021   HCT 41.1 02/25/2021   PLT 257.0 02/25/2021   GLUCOSE 93 02/25/2021   CHOL 143 02/25/2021   TRIG 155.0 (H) 02/25/2021   HDL 56.30 02/25/2021   LDLCALC 56 02/25/2021   ALT 15 02/25/2021   AST 17 02/25/2021   NA 138  02/25/2021   K 3.6 02/25/2021   CL 99 02/25/2021   CREATININE 0.95 02/25/2021   BUN 9 02/25/2021   CO2 33 (H) 02/25/2021   TSH 2.29 02/25/2021   INR 1.01 07/16/2017   HGBA1C 5.5 02/13/2019    No results found.  Assessment & Plan:   Makenzie was seen today for annual exam and hypertension.  Diagnoses and all orders for this visit:  Essential hypertension- She has not achieved her BP goal. I have asked her to be more compliant with indapamide and to work improve her lifestyle modifications. -     CBC with Differential/Platelet; Future -     Basic metabolic panel; Future -     TSH; Future -     Urinalysis, Routine w reflex microscopic; Future -     Hepatic function panel; Future -     indapamide (LOZOL) 2.5 MG tablet; Take 1 tablet (2.5 mg total) by mouth in the morning. -     Hepatic function panel -     Urinalysis, Routine w reflex microscopic -     TSH -     Basic metabolic panel -     CBC with Differential/Platelet  Gastroesophageal reflux disease without esophagitis- No sx's reported. -     CBC with Differential/Platelet; Future -     CBC with Differential/Platelet  Stage 3a chronic kidney disease ([place])- Her renal fxn is normal now. -     CBC with Differential/Platelet; Future -     Basic metabolic panel; Future -     Urinalysis, Routine w reflex microscopic; Future -     Hepatic function panel; Future -     Hepatic function panel -     Urinalysis, Routine w reflex microscopic -     Basic metabolic panel -     CBC with Differential/Platelet  Encounter for general adult medical examination with abnormal findings- Exam completed, labs reviewed, vaccines reviewed, cancer screenings addressed. -     Lipid panel; Future -     Lipid panel  Estrogen deficiency -     DG Bone Density; Future  Special screening for malignant neoplasms, colon -     Cologuard  Visit for screening mammogram -     MM DIGITAL SCREENING BILATERAL; Future  I have discontinued Tovah A.  Morina's oxyCODONE-acetaminophen. I am also having her maintain her BIOTIN PO and indapamide.  Meds ordered this encounter  Medications   indapamide (LOZOL) 2.5 MG tablet    Sig: Take 1 tablet (2.5 mg total) by mouth in the morning.    Dispense:  90 tablet    Refill:  1  Follow-up: Return in about 6 months (around 08/26/2021).  Scarlette Calico, MD

## 2021-02-28 ENCOUNTER — Encounter: Payer: Self-pay | Admitting: Internal Medicine

## 2021-03-12 DIAGNOSIS — Z1211 Encounter for screening for malignant neoplasm of colon: Secondary | ICD-10-CM | POA: Diagnosis not present

## 2021-03-18 ENCOUNTER — Telehealth: Payer: Self-pay | Admitting: Orthopedic Surgery

## 2021-03-18 NOTE — Telephone Encounter (Signed)
Patient called asked if she can get a copy of the letters for both of her surgeries from 2021. Patient said she need the letter stating when she returned to work for both surgeries. Patient asked if she could pick up the letters tomorrow?   The number to contact patient is [phone]

## 2021-03-18 NOTE — Telephone Encounter (Signed)
I sw pt, she needs to have an updated letter for worker's comp on when she returned back to work after both her surgeries. She was written to go back to work on 12/30/19 after first surgery and she returned 3 weeks after her second surgery. Will keep message pending until letter is ready for pt to p/u tomorrow.

## 2021-03-19 NOTE — Telephone Encounter (Signed)
Note at front desk for pt to p/u

## 2021-03-19 NOTE — Telephone Encounter (Signed)
Correction: return to work on 01/09/20**

## 2021-03-21 ENCOUNTER — Other Ambulatory Visit: Payer: Self-pay | Admitting: Internal Medicine

## 2021-03-21 DIAGNOSIS — R195 Other fecal abnormalities: Secondary | ICD-10-CM | POA: Insufficient documentation

## 2021-03-21 LAB — COLOGUARD: COLOGUARD: POSITIVE — AB

## 2021-04-14 ENCOUNTER — Encounter: Payer: Self-pay | Admitting: Internal Medicine

## 2021-04-14 ENCOUNTER — Ambulatory Visit
Admission: RE | Admit: 2021-04-14 | Discharge: 2021-04-14 | Disposition: A | Discharge status: Home / Self Care | Payer: BC Managed Care – PPO | Source: Ambulatory Visit | Attending: Internal Medicine | Admitting: Internal Medicine

## 2021-04-14 DIAGNOSIS — Z1231 Encounter for screening mammogram for malignant neoplasm of breast: Secondary | ICD-10-CM

## 2021-05-05 ENCOUNTER — Ambulatory Visit (AMBULATORY_SURGERY_CENTER): Payer: BC Managed Care – PPO | Admitting: *Deleted

## 2021-05-05 ENCOUNTER — Other Ambulatory Visit: Payer: Self-pay

## 2021-05-05 VITALS — Ht 66.0 in | Wt 213.0 lb

## 2021-05-05 DIAGNOSIS — R195 Other fecal abnormalities: Secondary | ICD-10-CM

## 2021-05-05 MED ORDER — NA SULFATE-K SULFATE-MG SULF 17.5-3.13-1.6 GM/177ML PO SOLN: 1.0000 | ORAL | 0 refills | Status: DC

## 2021-05-05 NOTE — Progress Notes (Signed)
Patient's pre-visit was done today over the phone with the patient. Name,DOB and address verified. Patient denies any allergies to Eggs and Soy. Patient denies any problems with anesthesia/sedation. Patient is not taking any diet pills or blood thinners. No home Oxygen.   Prep instructions & copy of consent mailed to pt-pt aware. Patient understands to call us back with any questions or concerns. Patient is aware of our care-partner policy and [facility]-09 safety protocol.   The patient is COVID-19 vaccinated.

## 2021-05-12 ENCOUNTER — Encounter: Payer: Self-pay | Admitting: Internal Medicine

## 2021-05-18 ENCOUNTER — Encounter: Payer: Self-pay | Admitting: Certified Registered Nurse Anesthetist

## 2021-05-19 ENCOUNTER — Ambulatory Visit (AMBULATORY_SURGERY_CENTER): Payer: BC Managed Care – PPO | Admitting: Internal Medicine

## 2021-05-19 ENCOUNTER — Encounter: Payer: Self-pay | Admitting: Internal Medicine

## 2021-05-19 VITALS — BP 118/73 | HR 45 | Temp 96.0°F | Resp 11 | Ht 66.0 in | Wt 213.0 lb

## 2021-05-19 DIAGNOSIS — D128 Benign neoplasm of rectum: Secondary | ICD-10-CM

## 2021-05-19 DIAGNOSIS — K635 Polyp of colon: Secondary | ICD-10-CM | POA: Diagnosis not present

## 2021-05-19 DIAGNOSIS — K621 Rectal polyp: Secondary | ICD-10-CM

## 2021-05-19 DIAGNOSIS — R195 Other fecal abnormalities: Secondary | ICD-10-CM

## 2021-05-19 DIAGNOSIS — D127 Benign neoplasm of rectosigmoid junction: Secondary | ICD-10-CM

## 2021-05-19 DIAGNOSIS — Z1211 Encounter for screening for malignant neoplasm of colon: Secondary | ICD-10-CM | POA: Diagnosis not present

## 2021-05-19 HISTORY — PX: COLONOSCOPY: SHX174

## 2021-05-19 MED ORDER — SODIUM CHLORIDE 0.9 % IV SOLN: 500.0000 mL | Freq: Once | INTRAVENOUS | Status: DC

## 2021-05-19 NOTE — Progress Notes (Signed)
Called to room to assist during endoscopic procedure.  Patient ID and intended procedure confirmed with present staff. Received instructions for my participation in the procedure from the performing physician.  

## 2021-05-19 NOTE — Patient Instructions (Signed)
Discharge instructions given. Handout on polyps. Resume previous medications. YOU HAD AN ENDOSCOPIC PROCEDURE TODAY AT THE [facility]:   Refer to the procedure report that was given to you for any specific questions about what was found during the examination.  If the procedure report does not answer your questions, please call your gastroenterologist to clarify.  If you requested that your care partner not be given the details of your procedure findings, then the procedure report has been included in a sealed envelope for you to review at your convenience later.  YOU SHOULD EXPECT: Some feelings of bloating in the abdomen. Passage of more gas than usual.  Walking can help get rid of the air that was put into your GI tract during the procedure and reduce the bloating. If you had a lower endoscopy (such as a colonoscopy or flexible sigmoidoscopy) you may notice spotting of blood in your stool or on the toilet paper. If you underwent a bowel prep for your procedure, you may not have a normal bowel movement for a few days.  Please Note:  You might notice some irritation and congestion in your nose or some drainage.  This is from the oxygen used during your procedure.  There is no need for concern and it should clear up in a day or so.  SYMPTOMS TO REPORT IMMEDIATELY:  Following lower endoscopy (colonoscopy or flexible sigmoidoscopy):  Excessive amounts of blood in the stool  Significant tenderness or worsening of abdominal pains  Swelling of the abdomen that is new, acute  Fever of 100F or higher   For urgent or emergent issues, a gastroenterologist can be reached at any hour by calling [phone]. Do not use MyChart messaging for urgent concerns.    DIET:  We do recommend a small meal at first, but then you may proceed to your regular diet.  Drink plenty of fluids but you should avoid alcoholic beverages for 24 hours.  ACTIVITY:  You should plan to take it easy for the rest  of today and you should NOT DRIVE or use heavy machinery until tomorrow (because of the sedation medicines used during the test).    FOLLOW UP: Our staff will call the number listed on your records 48-72 hours following your procedure to check on you and address any questions or concerns that you may have regarding the information given to you following your procedure. If we do not reach you, we will leave a message.  We will attempt to reach you two times.  During this call, we will ask if you have developed any symptoms of COVID 19. If you develop any symptoms (ie: fever, flu-like symptoms, shortness of breath, cough etc.) before then, please call [phone].  If you test positive for Covid 19 in the 2 weeks post procedure, please call and report this information to us.    If any biopsies were taken you will be contacted by phone or by letter within the next 1-3 weeks.  Please call us at [phone] if you have not heard about the biopsies in 3 weeks.    SIGNATURES/CONFIDENTIALITY: You and/or your care partner have signed paperwork which will be entered into your electronic medical record.  These signatures attest to the fact that that the information above on your After Visit Summary has been reviewed and is understood.  Full responsibility of the confidentiality of this discharge information lies with you and/or your care-partner.  

## 2021-05-19 NOTE — Op Note (Signed)
[place] ?Patient Name: Jamie Jenkins ?Procedure Date: 05/19/2021 1:25 PM ?MRN: [id number] ?Endoscopist: Jerene Bears , MD ?Age: 61 ?Referring MD:  ?Date of Birth: 1961/01/21 ?Gender: Female ?Account #: [id number] ?Procedure:                Colonoscopy ?Indications:              This is the patient's first colonoscopy, Positive  ?                          Cologuard test ?Medicines:                Monitored Anesthesia Care ?Procedure:                Pre-Anesthesia Assessment: ?                          - Prior to the procedure, a History and Physical  ?                          was performed, and patient medications and  ?                          allergies were reviewed. The patient's tolerance of  ?                          previous anesthesia was also reviewed. The risks  ?                          and benefits of the procedure and the sedation  ?                          options and risks were discussed with the patient.  ?                          All questions were answered, and informed consent  ?                          was obtained. Prior Anticoagulants: The patient has  ?                          taken no previous anticoagulant or antiplatelet  ?                          agents. ASA Grade Assessment: II - A patient with  ?                          mild systemic disease. After reviewing the risks  ?                          and benefits, the patient was deemed in  ?                          satisfactory condition to undergo the procedure. ?  After obtaining informed consent, the colonoscope  ?                          was passed under direct vision. Throughout the  ?                          procedure, the patient's blood pressure, pulse, and  ?                          oxygen saturations were monitored continuously. The  ?                          Olympus PCF-H190DL (#[id number]) Colonoscope was  ?                          introduced through the anus and advanced to the  ?                           cecum, identified by palpation. The colonoscopy was  ?                          performed without difficulty. The patient tolerated  ?                          the procedure well. The quality of the bowel  ?                          preparation was good. The ileocecal valve,  ?                          appendiceal orifice, and rectum were photographed. ?Scope In: 1:43:04 PM ?Scope Out: 2:03:03 PM ?Scope Withdrawal Time: 0 hours 17 minutes 7 seconds  ?Total Procedure Duration: 0 hours 19 minutes 59 seconds  ?Findings:                 The digital rectal exam was normal. ?                          A 6 mm polyp was found in the recto-sigmoid colon.  ?                          The polyp was sessile. The polyp was removed with a  ?                          cold snare. Resection and retrieval were complete. ?                          A 3 mm polyp was found in the distal rectum. The  ?                          polyp was sessile. The polyp was removed with a  ?  cold snare. Resection and retrieval were complete. ?                          The exam was otherwise without abnormality on  ?                          direct and retroflexion views. ?Complications:            No immediate complications. ?Estimated Blood Loss:     Estimated blood loss was minimal. ?Impression:               - One 6 mm polyp at the recto-sigmoid colon,  ?                          removed with a cold snare. Resected and retrieved. ?                          - One 3 mm polyp in the distal rectum, removed with  ?                          a cold snare. Resected and retrieved. ?                          - The examination was otherwise normal on direct  ?                          and retroflexion views. ?Recommendation:           - Patient has a contact number available for  ?                          emergencies. The signs and symptoms of potential  ?                          delayed complications were  discussed with the  ?                          patient. Return to normal activities tomorrow.  ?                          Written discharge instructions were provided to the  ?                          patient. ?                          - Resume previous diet. ?                          - Continue present medications. ?                          - Await pathology results. ?                          - Repeat colonoscopy is recommended. The  ?  colonoscopy date will be determined after pathology  ?                          results from today's exam become available for  ?                          review. ?Jerene Bears, MD ?05/19/2021 2:07:49 PM ?This report has been signed electronically. ?

## 2021-05-19 NOTE — Progress Notes (Signed)
? ?GASTROENTEROLOGY PROCEDURE H&P NOTE  ? ?Primary Care Physician: ?Janith Lima, MD ? ? ? ?Reason for Procedure:  Positive Cologuard ? ?Plan:    Colonoscopy ? ?Patient is appropriate for endoscopic procedure(s) in the ambulatory ([place]) setting. ? ?The nature of the procedure, as well as the risks, benefits, and alternatives were carefully and thoroughly reviewed with the patient. Ample time for discussion and questions allowed. The patient understood, was satisfied, and agreed to proceed.  ? ? ? ?HPI: ?Jamie Jenkins is a 61 y.o. female who presents for colonoscopy.  Medical history as below.  Tolerated the prep.  No recent chest pain or shortness of breath.  No abdominal pain today. ? ?Past Medical History:  ?Diagnosis Date  ? Ankle fracture   ? right  ? DDD (degenerative disc disease), lumbar   ? Gall stones   ? Hypertension   ? ? ?Past Surgical History:  ?Procedure Laterality Date  ? ANKLE FUSION Left 07/21/2017  ? Procedure: SUBTALAR AND TALONAVICULAR FUSION LEFT FOOT;  Surgeon: Newt Minion, MD;  Location: [facility];  Service: Orthopedics;  Laterality: Left;  ? AXILLARY LYMPH NODE DISSECTION Left 11/24/2012  ? Procedure: incision and drainage axillary abscess;  Surgeon: Adin Hector, MD;  Location: [facility];  Service: General;  Laterality: Left;  ? BREAST SURGERY  03/16/2010  ? breast biopsy  ? CHOLECYSTECTOMY    ? COLONOSCOPY  05/19/2021  ? positive cologuard  ? HARDWARE REMOVAL Right 08/16/2020  ? Procedure: RIGHT ANKLE REMOVAL FIBULA HARDWARE;  Surgeon: Newt Minion, MD;  Location: [facility];  Service: Orthopedics;  Laterality: Right;  ? ORIF ANKLE FRACTURE Right 11/22/2019  ? Procedure: OPEN REDUCTION INTERNAL FIXATION (ORIF) RIGHT ANKLE FRACTURE;  Surgeon: Newt Minion, MD;  Location: [facility];  Service: Orthopedics;  Laterality: Right;  ? TUBAL LIGATION    ? ? ?Prior to Admission medications   ?Medication Sig Start Date End Date Taking? Authorizing Provider  ?BIOTIN PO Take 2 tablets by mouth in the  morning.   Yes [provider]  ?cholecalciferol (D-VI-SOL) 10 MCG/ML LIQD Take 400 Units by mouth daily.   Yes [provider]  ?hydrochlorothiazide (HYDRODIURIL) 25 MG tablet Take 25 mg by mouth daily.   Yes [provider]  ? ? ?Current Outpatient Medications  ?Medication Sig Dispense Refill  ? BIOTIN PO Take 2 tablets by mouth in the morning.    ? cholecalciferol (D-VI-SOL) 10 MCG/ML LIQD Take 400 Units by mouth daily.    ? hydrochlorothiazide (HYDRODIURIL) 25 MG tablet Take 25 mg by mouth daily.    ? ?Current Facility-Administered Medications  ?Medication Dose Route Frequency Provider Last Rate Last Admin  ? 0.9 %  sodium chloride infusion  500 mL Intravenous Once Ytzel Gubler, Lajuan Lines, MD      ? ? ?Allergies as of 05/19/2021  ? (No Known Allergies)  ? ? ?Family History  ?Problem Relation Age of Onset  ? Pancreatic cancer Mother   ?     Deceased  ? Hypertension Father   ? Other Father   ?     stomach aneurysm  ? Deep vein thrombosis Brother   ?     Deceased  ? Colon cancer Neg Hx   ? Colon polyps Neg Hx   ? ? ?Social History  ? ?Socioeconomic History  ? Marital status: Married  ?  Spouse name: Not on file  ? Number of children: 3  ? Years of education: 58  ? Highest  education level: Not on file  ?Occupational History  ? Occupation: Scientist, research (medical)  ?Tobacco Use  ? Smoking status: Never  ? Smokeless tobacco: Never  ?Vaping Use  ? Vaping Use: Never used  ?Substance and Sexual Activity  ? Alcohol use: Not Currently  ?  Alcohol/week: 0.0 standard drinks  ? Drug use: No  ? Sexual activity: Not Currently  ?  Partners: Male  ?  Birth control/protection: Post-menopausal  ?Other Topics Concern  ? Not on file  ?Social History Narrative  ? Born and raised in [place], [place]. Currently resides in a house with her husband in a one story home. No pets. Fun: Go to the beach;   ? Denies religious beliefs that would effect health care.   ? Has 3 children.   ? Works as a Nurse, learning disability.  Education: some  college.  ? ?Social Determinants of Health  ? ?Financial Resource Strain: Not on file  ?Food Insecurity: Not on file  ?Transportation Needs: Not on file  ?Physical Activity: Not on file  ?Stress: Not on file  ?Social Connections: Not on file  ?Intimate Partner Violence: Not on file  ? ? ?Physical Exam: ?Vital signs in last 24 hours: ?'@BP'$  (!) 156/86   Pulse (!) 46   Temp (!) 96 ?F (35.6 ?C) (Temporal)   Resp 12   Ht '5\' 6"'$  (1.676 m)   Wt 213 lb (96.6 kg)   LMP 08/23/2012   SpO2 100%   BMI 34.38 kg/m?  ?GEN: NAD ?EYE: Sclerae anicteric ?ENT: MMM ?CV: Non-tachycardic ?Pulm: CTA b/l ?GI: Soft, NT/ND ?NEURO:  Alert & Oriented x 3 ? ? ?Jamie Jarred, MD ?[facility] ? ?05/19/2021 1:33 PM ? ?

## 2021-05-19 NOTE — Progress Notes (Signed)
Report given to PACU, vss 

## 2021-05-19 NOTE — Progress Notes (Signed)
Pt's states no medical or surgical changes since previsit or office visit.  ° °VS DT °

## 2021-05-21 ENCOUNTER — Telehealth: Payer: Self-pay

## 2021-05-21 NOTE — Telephone Encounter (Signed)
?  Follow up Call- ? ?Call back number 05/19/2021  ?Post procedure Call Back phone  # [phone]  ?Permission to leave phone message Yes  ?Some recent data might be hidden  ?  ? ?Patient questions: ? ?Do you have a fever, pain , or abdominal swelling? No. ?Pain Score  0 * ? ?Have you tolerated food without any problems? Yes.   ? ?Have you been able to return to your normal activities? Yes.   ? ?Do you have any questions about your discharge instructions: ?Diet   No. ?Medications  No. ?Follow up visit  No. ? ?Do you have questions or concerns about your Care? No. ? ?Actions: ?* If pain score is 4 or above: ?No action needed, pain <4. ? ? ?

## 2021-05-26 ENCOUNTER — Encounter: Payer: Self-pay | Admitting: Internal Medicine

## 2021-09-07 ENCOUNTER — Other Ambulatory Visit: Payer: Self-pay | Admitting: Internal Medicine

## 2021-09-07 DIAGNOSIS — I10 Essential (primary) hypertension: Secondary | ICD-10-CM

## 2021-09-17 ENCOUNTER — Other Ambulatory Visit: Payer: Self-pay | Admitting: Internal Medicine

## 2021-09-17 ENCOUNTER — Telehealth: Payer: Self-pay | Admitting: Internal Medicine

## 2021-09-17 DIAGNOSIS — I10 Essential (primary) hypertension: Secondary | ICD-10-CM

## 2021-09-17 MED ORDER — HYDROCHLOROTHIAZIDE 25 MG PO TABS: 25.0000 mg | ORAL_TABLET | Freq: Every day | ORAL | 0 refills | Status: DC

## 2021-09-17 NOTE — Telephone Encounter (Signed)
Caller & Relationship to patient: Alazia Crocket  Call back number: [phone]  Date of last office visit: 02/25/21  Date of next office visit: 10/14/21  Medication(s) to be refilled:  hydrochlorothiazide (HYDRODIURIL) 25 MG tablet    Preferred Pharmacy:  [facility] [place], [place] Phone:  [phone]  Fax:  [phone]

## 2021-10-14 ENCOUNTER — Encounter: Payer: Self-pay | Admitting: Internal Medicine

## 2021-10-14 ENCOUNTER — Ambulatory Visit: Payer: BC Managed Care – PPO | Admitting: Internal Medicine

## 2021-10-14 VITALS — BP 126/82 | HR 60 | Temp 97.8°F | Ht 66.0 in | Wt 222.0 lb

## 2021-10-14 DIAGNOSIS — Z23 Encounter for immunization: Secondary | ICD-10-CM

## 2021-10-14 DIAGNOSIS — I10 Essential (primary) hypertension: Secondary | ICD-10-CM

## 2021-10-14 DIAGNOSIS — N1831 Chronic kidney disease, stage 3a: Secondary | ICD-10-CM | POA: Diagnosis not present

## 2021-10-14 LAB — URINALYSIS, ROUTINE W REFLEX MICROSCOPIC
Bilirubin Urine: NEGATIVE
Hgb urine dipstick: NEGATIVE
Ketones, ur: NEGATIVE
Leukocytes,Ua: NEGATIVE
Nitrite: NEGATIVE
Specific Gravity, Urine: 1.015 (ref 1.000–1.030)
Total Protein, Urine: NEGATIVE
Urine Glucose: NEGATIVE
Urobilinogen, UA: 0.2 (ref 0.0–1.0)
pH: 7 (ref 5.0–8.0)

## 2021-10-14 LAB — CBC WITH DIFFERENTIAL/PLATELET
Basophils Absolute: 0 10*3/uL (ref 0.0–0.1)
Basophils Relative: 0.2 % (ref 0.0–3.0)
Eosinophils Absolute: 0.1 10*3/uL (ref 0.0–0.7)
Eosinophils Relative: 0.9 % (ref 0.0–5.0)
HCT: 41.1 % (ref 36.0–46.0)
Hemoglobin: 13.9 g/dL (ref 12.0–15.0)
Lymphocytes Relative: 39.2 % (ref 12.0–46.0)
Lymphs Abs: 2.5 10*3/uL (ref 0.7–4.0)
MCHC: 33.8 g/dL (ref 30.0–36.0)
MCV: 98.8 fl (ref 78.0–100.0)
Monocytes Absolute: 0.3 10*3/uL (ref 0.1–1.0)
Monocytes Relative: 5.3 % (ref 3.0–12.0)
Neutro Abs: 3.4 10*3/uL (ref 1.4–7.7)
Neutrophils Relative %: 54.4 % (ref 43.0–77.0)
Platelets: 226 10*3/uL (ref 150.0–400.0)
RBC: 4.16 Mil/uL (ref 3.87–5.11)
RDW: 13.3 % (ref 11.5–15.5)
WBC: 6.3 10*3/uL (ref 4.0–10.5)

## 2021-10-14 LAB — BASIC METABOLIC PANEL
BUN: 11 mg/dL (ref 6–23)
CO2: 29 mEq/L (ref 19–32)
Calcium: 9.3 mg/dL (ref 8.4–10.5)
Chloride: 102 mEq/L (ref 96–112)
Creatinine, Ser: 0.9 mg/dL (ref 0.40–1.20)
GFR: 69.15 mL/min (ref >60.00)
Glucose, Bld: 90 mg/dL (ref 70–99)
Potassium: 4 mEq/L (ref 3.5–5.1)
Sodium: 139 mEq/L (ref 135–145)

## 2021-10-14 NOTE — Progress Notes (Unsigned)
Subjective:  Patient ID: Jamie Jenkins, female    DOB: 1960/07/10  Age: 61 y.o. MRN: [id number]  CC: No chief complaint on file.   HPI Sheena Donegan presents for ***  Outpatient Medications Prior to Visit  Medication Sig Dispense Refill   BIOTIN PO Take 2 tablets by mouth in the morning.     cholecalciferol (D-VI-SOL) 10 MCG/ML LIQD Take 400 Units by mouth daily.     hydrochlorothiazide (HYDRODIURIL) 25 MG tablet Take 1 tablet (25 mg total) by mouth daily. 30 tablet 0   No facility-administered medications prior to visit.    ROS Review of Systems  Objective:  BP 126/82 (BP Location: Right Arm, Patient Position: Sitting, Cuff Size: Large)   Pulse 60   Temp 97.8 F (36.6 C) (Oral)   Ht '5\' 6"'$  (1.676 m)   Wt 222 lb (100.7 kg)   LMP 06/24/2012   SpO2 97%   BMI 35.83 kg/m   BP Readings from Last 3 Encounters:  10/14/21 126/82  05/19/21 118/73  02/25/21 (!) 144/88    Wt Readings from Last 3 Encounters:  10/14/21 222 lb (100.7 kg)  05/19/21 213 lb (96.6 kg)  05/05/21 213 lb (96.6 kg)    Physical Exam Cardiovascular:     Rate and Rhythm: Regular rhythm. Bradycardia present.     Heart sounds: Normal heart sounds, S1 normal and S2 normal. No murmur heard.    No gallop.     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     Left lower leg: No edema.     Lab Results  Component Value Date   WBC 8.2 02/25/2021   HGB 14.2 02/25/2021   HCT 41.1 02/25/2021   PLT 257.0 02/25/2021   GLUCOSE 93 02/25/2021   CHOL 143 02/25/2021   TRIG 155.0 (H) 02/25/2021   HDL 56.30 02/25/2021   LDLCALC 56 02/25/2021   ALT 15 02/25/2021   AST 17 02/25/2021   NA 138 02/25/2021   K 3.6 02/25/2021   CL 99 02/25/2021   CREATININE 0.95 02/25/2021   BUN 9 02/25/2021   CO2 33 (H) 02/25/2021   TSH 2.29 02/25/2021   INR 1.01 07/16/2017   HGBA1C 5.5 02/13/2019    MM 3D  SCREEN BREAST BILATERAL  Result Date: 04/14/2021 CLINICAL DATA:  Screening. EXAM: DIGITAL SCREENING BILATERAL MAMMOGRAM WITH TOMOSYNTHESIS AND CAD TECHNIQUE: Bilateral screening digital craniocaudal and mediolateral oblique mammograms were obtained. Bilateral screening digital breast tomosynthesis was performed. The images were evaluated with computer-aided detection. COMPARISON:  Previous exam(s). ACR Breast Density Category b: There are scattered areas of fibroglandular density. FINDINGS: There are no findings suspicious for malignancy. IMPRESSION: No mammographic evidence of malignancy. A result letter of this screening mammogram will be mailed directly to the patient. RECOMMENDATION: Screening mammogram in one year. (Code:SM-B-01Y) BI-RADS CATEGORY  1: Negative. Electronically Signed   By: Ammie Ferrier M.D.   On: 04/14/2021 13:49    Assessment & Plan:   Diagnoses and all orders for this visit:  Stage 3a chronic kidney disease ([place])  Essential hypertension -     EKG 12-Lead  Other orders -     Zoster Recombinant (Shingrix )   I am having [name] maintain her BIOTIN PO, cholecalciferol, and hydrochlorothiazide.  No orders of the defined types were placed in this encounter.    Follow-up: No  follow-ups on file.  Jamie Calico, MD

## 2021-10-14 NOTE — Patient Instructions (Signed)
Hypertension, Adult High blood pressure (hypertension) is when the force of blood pumping through the arteries is too strong. The arteries are the blood vessels that carry blood from the heart throughout the body. Hypertension forces the heart to work harder to pump blood and may cause arteries to become narrow or stiff. Untreated or uncontrolled hypertension can lead to a heart attack, heart failure, a stroke, kidney disease, and other problems. A blood pressure reading consists of a higher number over a lower number. Ideally, your blood pressure should be below 120/80. The first ("top") number is called the systolic pressure. It is a measure of the pressure in your arteries as your heart beats. The second ("bottom") number is called the diastolic pressure. It is a measure of the pressure in your arteries as the heart relaxes. What are the causes? The exact cause of this condition is not known. There are some conditions that result in high blood pressure. What increases the risk? Certain factors may make you more likely to develop high blood pressure. Some of these risk factors are under your control, including: Smoking. Not getting enough exercise or physical activity. Being overweight. Having too much fat, sugar, calories, or salt (sodium) in your diet. Drinking too much alcohol. Other risk factors include: Having a personal history of heart disease, diabetes, high cholesterol, or kidney disease. Stress. Having a family history of high blood pressure and high cholesterol. Having obstructive sleep apnea. Age. The risk increases with age. What are the signs or symptoms? High blood pressure may not cause symptoms. Very high blood pressure (hypertensive crisis) may cause: Headache. Fast or irregular heartbeats (palpitations). Shortness of breath. Nosebleed. Nausea and vomiting. Vision changes. Severe chest pain, dizziness, and seizures. How is this diagnosed? This condition is diagnosed by  measuring your blood pressure while you are seated, with your arm resting on a flat surface, your legs uncrossed, and your feet flat on the floor. The cuff of the blood pressure monitor will be placed directly against the skin of your upper arm at the level of your heart. Blood pressure should be measured at least twice using the same arm. Certain conditions can cause a difference in blood pressure between your right and left arms. If you have a high blood pressure reading during one visit or you have normal blood pressure with other risk factors, you may be asked to: Return on a different day to have your blood pressure checked again. Monitor your blood pressure at home for 1 week or longer. If you are diagnosed with hypertension, you may have other blood or imaging tests to help your health care provider understand your overall risk for other conditions. How is this treated? This condition is treated by making healthy lifestyle changes, such as eating healthy foods, exercising more, and reducing your alcohol intake. You may be referred for counseling on a healthy diet and physical activity. Your health care provider may prescribe medicine if lifestyle changes are not enough to get your blood pressure under control and if: Your systolic blood pressure is above 130. Your diastolic blood pressure is above 80. Your personal target blood pressure may vary depending on your medical conditions, your age, and other factors. Follow these instructions at home: Eating and drinking  Eat a diet that is high in fiber and potassium, and low in sodium, added sugar, and fat. An example of this eating plan is called the DASH diet. DASH stands for Dietary Approaches to Stop Hypertension. To eat this way: Eat   plenty of fresh fruits and vegetables. Try to fill one half of your plate at each meal with fruits and vegetables. Eat whole grains, such as whole-wheat pasta, brown rice, or whole-grain bread. Fill about one  fourth of your plate with whole grains. Eat or drink low-fat dairy products, such as skim milk or low-fat yogurt. Avoid fatty cuts of meat, processed or cured meats, and poultry with skin. Fill about one fourth of your plate with lean proteins, such as fish, chicken without skin, beans, eggs, or tofu. Avoid pre-made and processed foods. These tend to be higher in sodium, added sugar, and fat. Reduce your daily sodium intake. Many people with hypertension should eat less than 1,500 mg of sodium a day. Do not drink alcohol if: Your health care provider tells you not to drink. You are pregnant, may be pregnant, or are planning to become pregnant. If you drink alcohol: Limit how much you have to: 0-1 drink a day for women. 0-2 drinks a day for men. Know how much alcohol is in your drink. In the [place] one drink equals one 12 oz bottle of beer (355 mL), one 5 oz glass of wine (148 mL), or one 1 oz glass of hard liquor (44 mL). Lifestyle  Work with your health care provider to maintain a healthy body weight or to lose weight. Ask what an ideal weight is for you. Get at least 30 minutes of exercise that causes your heart to beat faster (aerobic exercise) most days of the week. Activities may include walking, swimming, or biking. Include exercise to strengthen your muscles (resistance exercise), such as Pilates or lifting weights, as part of your weekly exercise routine. Try to do these types of exercises for 30 minutes at least 3 days a week. Do not use any products that contain nicotine or tobacco. These products include cigarettes, chewing tobacco, and vaping devices, such as e-cigarettes. If you need help quitting, ask your health care provider. Monitor your blood pressure at home as told by your health care provider. Keep all follow-up visits. This is important. Medicines Take over-the-counter and prescription medicines only as told by your health care provider. Follow directions carefully. Blood  pressure medicines must be taken as prescribed. Do not skip doses of blood pressure medicine. Doing this puts you at risk for problems and can make the medicine less effective. Ask your health care provider about side effects or reactions to medicines that you should watch for. Contact a health care provider if you: Think you are having a reaction to a medicine you are taking. Have headaches that keep coming back (recurring). Feel dizzy. Have swelling in your ankles. Have trouble with your vision. Get help right away if you: Develop a severe headache or confusion. Have unusual weakness or numbness. Feel faint. Have severe pain in your chest or abdomen. Vomit repeatedly. Have trouble breathing. These symptoms may be an emergency. Get help right away. Call 911. Do not wait to see if the symptoms will go away. Do not drive yourself to the hospital. Summary Hypertension is when the force of blood pumping through your arteries is too strong. If this condition is not controlled, it may put you at risk for serious complications. Your personal target blood pressure may vary depending on your medical conditions, your age, and other factors. For most people, a normal blood pressure is less than 120/80. Hypertension is treated with lifestyle changes, medicines, or a combination of both. Lifestyle changes include losing weight, eating a healthy,   low-sodium diet, exercising more, and limiting alcohol. This information is not intended to replace advice given to you by your health care provider. Make sure you discuss any questions you have with your health care provider. Document Revised: 01/07/2021 Document Reviewed: 01/07/2021 Elsevier Patient Education  2023 Elsevier Inc.  

## 2021-10-15 ENCOUNTER — Encounter: Payer: Self-pay | Admitting: Internal Medicine

## 2021-10-15 ENCOUNTER — Other Ambulatory Visit: Payer: Self-pay | Admitting: Internal Medicine

## 2021-10-15 DIAGNOSIS — I10 Essential (primary) hypertension: Secondary | ICD-10-CM

## 2021-10-15 MED ORDER — HYDROCHLOROTHIAZIDE 25 MG PO TABS: 25.0000 mg | ORAL_TABLET | Freq: Every day | ORAL | 1 refills | Status: DC

## 2021-10-30 DIAGNOSIS — H524 Presbyopia: Secondary | ICD-10-CM | POA: Diagnosis not present

## 2021-10-30 DIAGNOSIS — H2513 Age-related nuclear cataract, bilateral: Secondary | ICD-10-CM | POA: Diagnosis not present

## 2021-10-30 DIAGNOSIS — H5203 Hypermetropia, bilateral: Secondary | ICD-10-CM | POA: Diagnosis not present

## 2022-04-16 IMAGING — MG MM DIGITAL SCREENING BILAT W/ TOMO AND CAD
8 of 15 series · 8 of 40 positions shown · non-contrast
Comparison: Previous exam(s).

CLINICAL DATA: Screening.

EXAM:
DIGITAL SCREENING BILATERAL MAMMOGRAM WITH TOMOSYNTHESIS AND CAD
TECHNIQUE: Bilateral screening digital craniocaudal and mediolateral oblique
mammograms were obtained. Bilateral screening digital breast
tomosynthesis was performed. The images were evaluated with
computer-aided detection.

[R MLO synth-2D (1 of 2)]
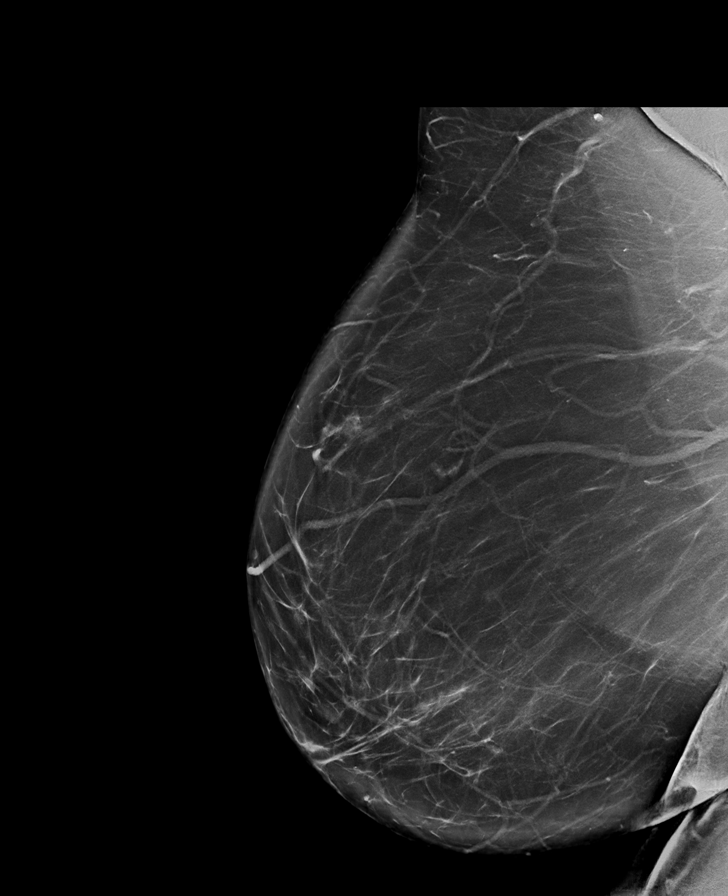

[L MLO synth-2D]
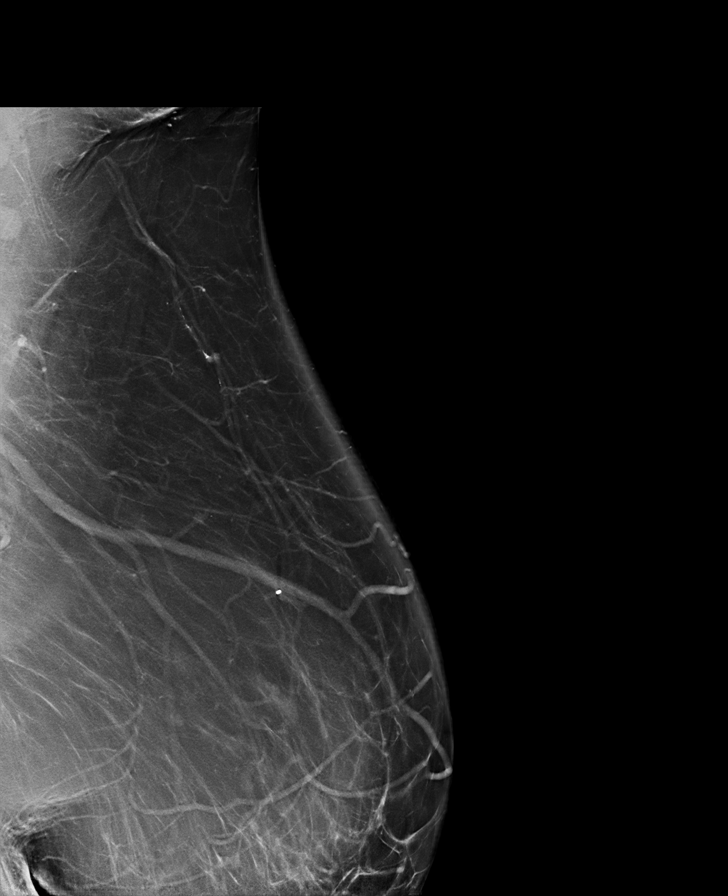

[L CC synth-2D (1 of 2)]
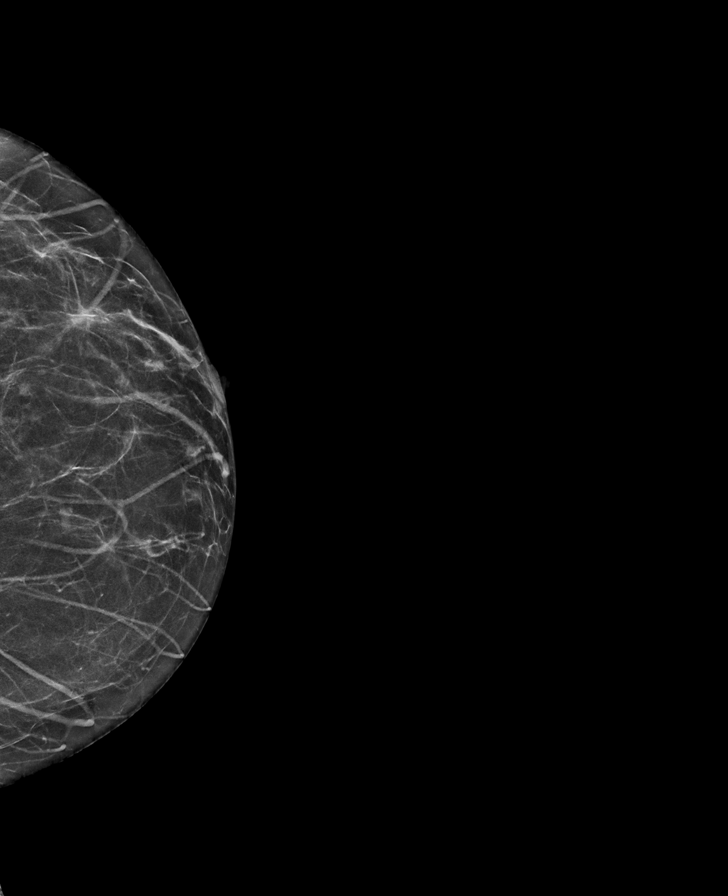

[R MLO synth-2D (2 of 2)]
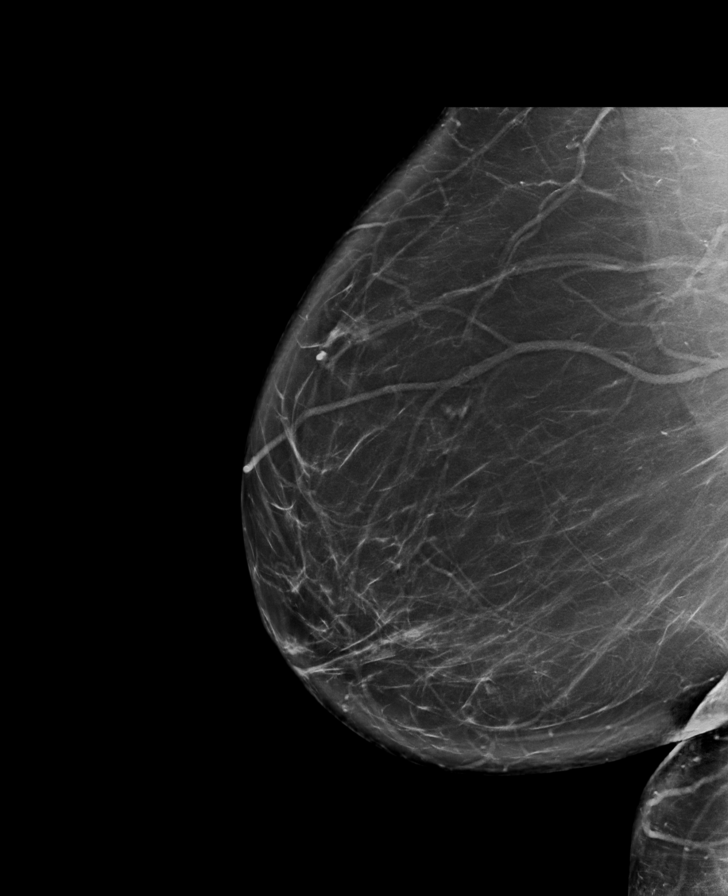

[R CC synth-2D (1 of 2)]
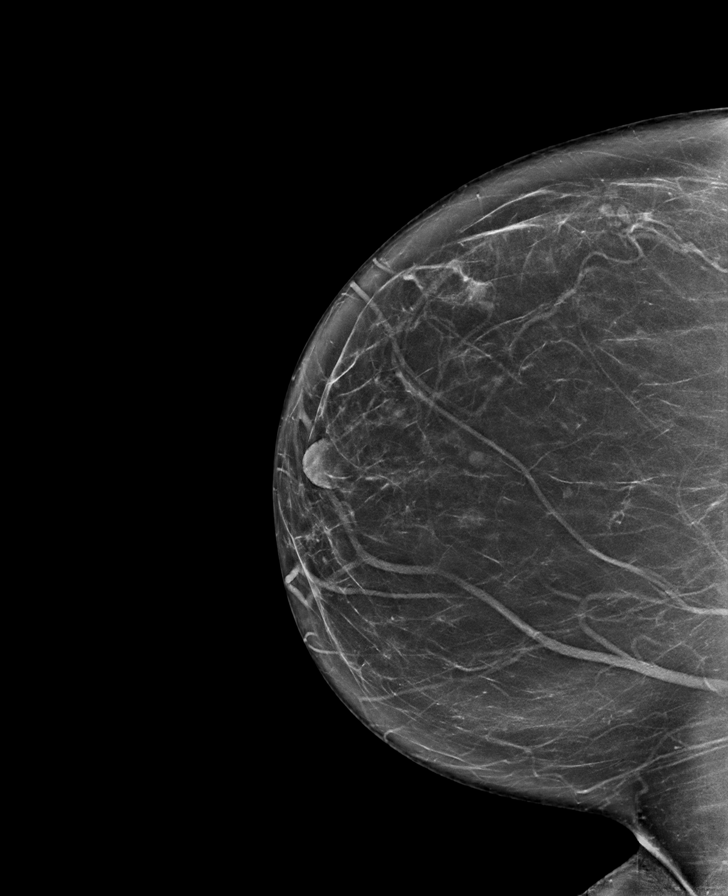

[L CC synth-2D (2 of 2)]
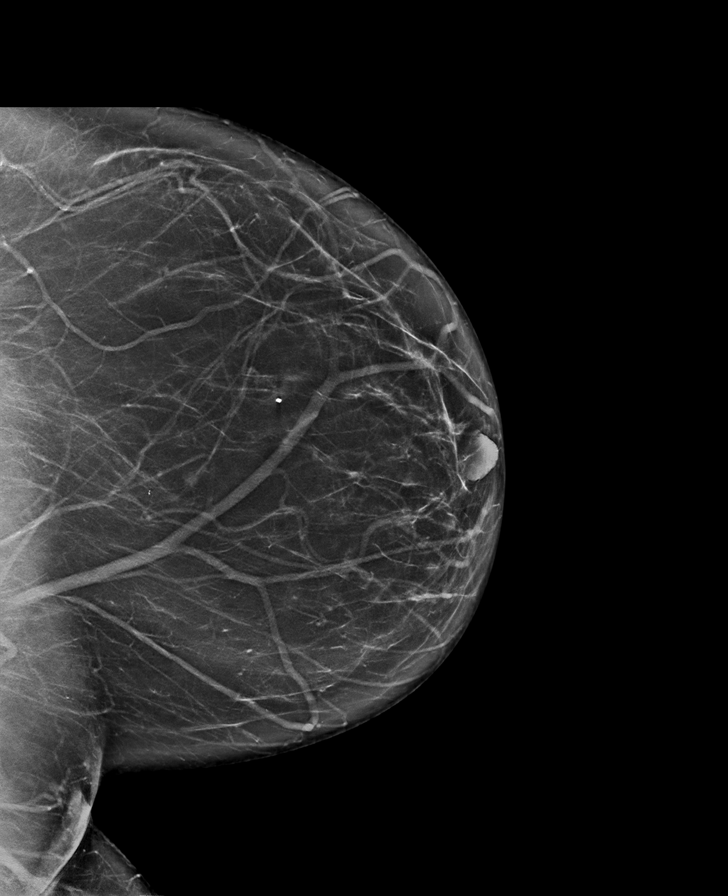

[R CC synth-2D (2 of 2)]
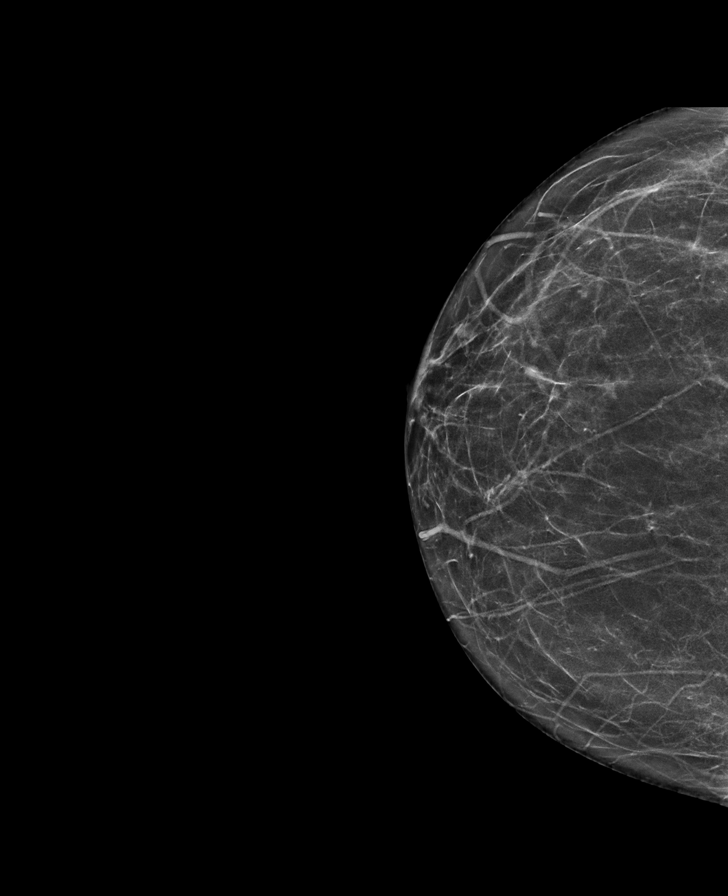

[L CC tomo · tomo slice 56/82.0]
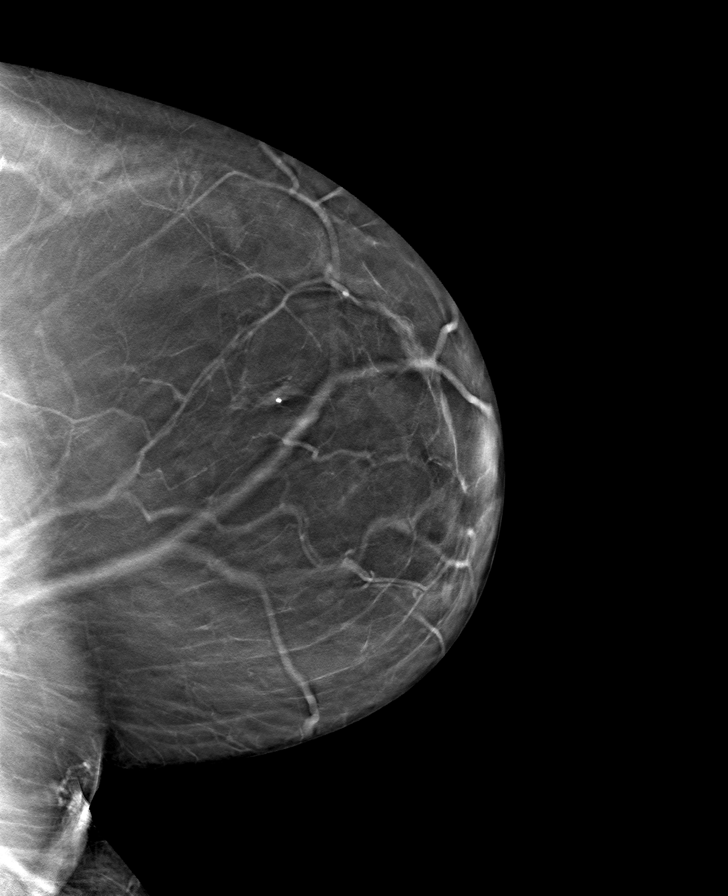

[8 of 40 positions shown; findings below may reference images not displayed]

ACR Breast Density Category b: There are scattered areas of
fibroglandular density.
FINDINGS: There are no findings suspicious for malignancy.
IMPRESSION: No mammographic evidence of malignancy. A result letter of this
screening mammogram will be mailed directly to the patient.

RECOMMENDATION:
Screening mammogram in one year. (Code:51-O-LD2)

BI-RADS CATEGORY  1: Negative.

## 2022-04-30 ENCOUNTER — Other Ambulatory Visit: Payer: Self-pay | Admitting: Internal Medicine

## 2022-04-30 DIAGNOSIS — Z1231 Encounter for screening mammogram for malignant neoplasm of breast: Secondary | ICD-10-CM

## 2022-05-13 ENCOUNTER — Ambulatory Visit
Admission: RE | Admit: 2022-05-13 | Discharge: 2022-05-13 | Disposition: A | Discharge status: Home / Self Care | Payer: 59 | Source: Ambulatory Visit | Attending: Internal Medicine | Admitting: Internal Medicine

## 2022-05-13 DIAGNOSIS — Z1231 Encounter for screening mammogram for malignant neoplasm of breast: Secondary | ICD-10-CM

## 2022-06-01 ENCOUNTER — Other Ambulatory Visit: Payer: Self-pay | Admitting: Internal Medicine

## 2022-06-01 DIAGNOSIS — I10 Essential (primary) hypertension: Secondary | ICD-10-CM

## 2022-06-08 ENCOUNTER — Telehealth: Payer: Self-pay | Admitting: Internal Medicine

## 2022-06-08 DIAGNOSIS — I10 Essential (primary) hypertension: Secondary | ICD-10-CM

## 2022-06-08 MED ORDER — HYDROCHLOROTHIAZIDE 25 MG PO TABS: 25.0000 mg | ORAL_TABLET | Freq: Every day | ORAL | 0 refills | Status: AC

## 2022-06-08 NOTE — Telephone Encounter (Signed)
Sent 30 day supply to [facility] until appt.Marland KitchenJohny Jenkins

## 2022-06-08 NOTE — Telephone Encounter (Signed)
Prescription Request  06/08/2022  LOV: 10/14/2021  What is the name of the medication or equipment? hydrochlorothizide  Have you contacted your pharmacy to request a refill? Yes   Which pharmacy would you like this sent to?   [id number] [place] [place] [id number] [place] [id number] Phone: [phone] Fax: [phone]    Patient notified that their request is being sent to the clinical staff for review and that they should receive a response within 2 business days.   Please advise at Mobile [phone] (mobile)

## 2022-06-22 ENCOUNTER — Telehealth: Payer: Self-pay | Admitting: Orthopedic Surgery

## 2022-06-22 NOTE — Telephone Encounter (Signed)
Patient completed auth for all xrays to CD. Please call patient when ready [phone]

## 2022-06-22 NOTE — Telephone Encounter (Signed)
Patient aware CD is ready for pick up at front desk 

## 2022-06-30 ENCOUNTER — Ambulatory Visit: Payer: 59 | Admitting: Internal Medicine

## 2022-07-09 ENCOUNTER — Other Ambulatory Visit: Payer: Self-pay | Admitting: Internal Medicine

## 2022-07-09 DIAGNOSIS — I10 Essential (primary) hypertension: Secondary | ICD-10-CM

## 2022-07-15 ENCOUNTER — Ambulatory Visit: Payer: 59 | Admitting: Internal Medicine

## 2022-08-19 ENCOUNTER — Encounter: Payer: Self-pay | Admitting: Internal Medicine

## 2022-08-19 ENCOUNTER — Ambulatory Visit (INDEPENDENT_AMBULATORY_CARE_PROVIDER_SITE_OTHER): Payer: Self-pay | Admitting: Internal Medicine

## 2022-08-19 VITALS — BP 122/64 | HR 67 | Temp 98.0°F | Ht 66.0 in | Wt 213.0 lb

## 2022-08-19 DIAGNOSIS — I5032 Chronic diastolic (congestive) heart failure: Secondary | ICD-10-CM | POA: Insufficient documentation

## 2022-08-19 DIAGNOSIS — R0609 Other forms of dyspnea: Secondary | ICD-10-CM | POA: Insufficient documentation

## 2022-08-19 DIAGNOSIS — I10 Essential (primary) hypertension: Secondary | ICD-10-CM

## 2022-08-19 DIAGNOSIS — R9431 Abnormal electrocardiogram [ECG] [EKG]: Secondary | ICD-10-CM

## 2022-08-19 DIAGNOSIS — K219 Gastro-esophageal reflux disease without esophagitis: Secondary | ICD-10-CM

## 2022-08-19 DIAGNOSIS — N1831 Chronic kidney disease, stage 3a: Secondary | ICD-10-CM

## 2022-08-19 DIAGNOSIS — E781 Pure hyperglyceridemia: Secondary | ICD-10-CM

## 2022-08-19 LAB — URINALYSIS, ROUTINE W REFLEX MICROSCOPIC
Bilirubin Urine: NEGATIVE
Hgb urine dipstick: NEGATIVE
Leukocytes,Ua: NEGATIVE
Nitrite: NEGATIVE
Specific Gravity, Urine: 1.02 (ref 1.000–1.030)
Total Protein, Urine: NEGATIVE
Urine Glucose: NEGATIVE
Urobilinogen, UA: 0.2 (ref 0.0–1.0)
pH: 7 (ref 5.0–8.0)

## 2022-08-19 LAB — CBC WITH DIFFERENTIAL/PLATELET
Basophils Absolute: 0 10*3/uL (ref 0.0–0.1)
Basophils Relative: 0.4 % (ref 0.0–3.0)
Eosinophils Absolute: 0.1 10*3/uL (ref 0.0–0.7)
Eosinophils Relative: 0.8 % (ref 0.0–5.0)
HCT: 45.8 % (ref 36.0–46.0)
Hemoglobin: 15.4 g/dL — ABNORMAL HIGH (ref 12.0–15.0)
Lymphocytes Relative: 43.5 % (ref 12.0–46.0)
Lymphs Abs: 2.9 10*3/uL (ref 0.7–4.0)
MCHC: 33.6 g/dL (ref 30.0–36.0)
MCV: 99.7 fl (ref 78.0–100.0)
Monocytes Absolute: 0.3 10*3/uL (ref 0.1–1.0)
Monocytes Relative: 4.7 % (ref 3.0–12.0)
Neutro Abs: 3.4 10*3/uL (ref 1.4–7.7)
Neutrophils Relative %: 50.6 % (ref 43.0–77.0)
Platelets: 245 10*3/uL (ref 150.0–400.0)
RBC: 4.59 Mil/uL (ref 3.87–5.11)
RDW: 13.4 % (ref 11.5–15.5)
WBC: 6.7 10*3/uL (ref 4.0–10.5)

## 2022-08-19 LAB — BASIC METABOLIC PANEL
BUN: 7 mg/dL (ref 6–23)
CO2: 24 mEq/L (ref 19–32)
Calcium: 9.6 mg/dL (ref 8.4–10.5)
Chloride: 102 mEq/L (ref 96–112)
Creatinine, Ser: 0.93 mg/dL (ref 0.40–1.20)
GFR: 66.08 mL/min (ref >60.00)
Glucose, Bld: 96 mg/dL (ref 70–99)
Potassium: 3.8 mEq/L (ref 3.5–5.1)
Sodium: 140 mEq/L (ref 135–145)

## 2022-08-19 LAB — LIPID PANEL
Cholesterol: 143 mg/dL (ref 0–200)
HDL: 59.5 mg/dL (ref >39.00)
NonHDL: 83.1
Total CHOL/HDL Ratio: 2
Triglycerides: 268 mg/dL — ABNORMAL HIGH (ref 0.0–149.0)
VLDL: 53.6 mg/dL — ABNORMAL HIGH (ref 0.0–40.0)

## 2022-08-19 LAB — TROPONIN I (HIGH SENSITIVITY): High Sens Troponin I: 4 ng/L (ref 2–17)

## 2022-08-19 LAB — HEPATIC FUNCTION PANEL
ALT: 26 U/L (ref 0–35)
AST: 29 U/L (ref 0–37)
Albumin: 4.2 g/dL (ref 3.5–5.2)
Alkaline Phosphatase: 136 U/L — ABNORMAL HIGH (ref 39–117)
Bilirubin, Direct: 0.1 mg/dL (ref 0.0–0.3)
Total Bilirubin: 0.3 mg/dL (ref 0.2–1.2)
Total Protein: 7.9 g/dL (ref 6.0–8.3)

## 2022-08-19 LAB — TSH: TSH: 2.7 u[IU]/mL (ref 0.35–5.50)

## 2022-08-19 LAB — BRAIN NATRIURETIC PEPTIDE: Pro B Natriuretic peptide (BNP): 21 pg/mL (ref 0.0–100.0)

## 2022-08-19 NOTE — Patient Instructions (Signed)
Hypertension, Adult High blood pressure (hypertension) is when the force of blood pumping through the arteries is too strong. The arteries are the blood vessels that carry blood from the heart throughout the body. Hypertension forces the heart to work harder to pump blood and may cause arteries to become narrow or stiff. Untreated or uncontrolled hypertension can lead to a heart attack, heart failure, a stroke, kidney disease, and other problems. A blood pressure reading consists of a higher number over a lower number. Ideally, your blood pressure should be below 120/80. The first ("top") number is called the systolic pressure. It is a measure of the pressure in your arteries as your heart beats. The second ("bottom") number is called the diastolic pressure. It is a measure of the pressure in your arteries as the heart relaxes. What are the causes? The exact cause of this condition is not known. There are some conditions that result in high blood pressure. What increases the risk? Certain factors may make you more likely to develop high blood pressure. Some of these risk factors are under your control, including: Smoking. Not getting enough exercise or physical activity. Being overweight. Having too much fat, sugar, calories, or salt (sodium) in your diet. Drinking too much alcohol. Other risk factors include: Having a personal history of heart disease, diabetes, high cholesterol, or kidney disease. Stress. Having a family history of high blood pressure and high cholesterol. Having obstructive sleep apnea. Age. The risk increases with age. What are the signs or symptoms? High blood pressure may not cause symptoms. Very high blood pressure (hypertensive crisis) may cause: Headache. Fast or irregular heartbeats (palpitations). Shortness of breath. Nosebleed. Nausea and vomiting. Vision changes. Severe chest pain, dizziness, and seizures. How is this diagnosed? This condition is diagnosed by  measuring your blood pressure while you are seated, with your arm resting on a flat surface, your legs uncrossed, and your feet flat on the floor. The cuff of the blood pressure monitor will be placed directly against the skin of your upper arm at the level of your heart. Blood pressure should be measured at least twice using the same arm. Certain conditions can cause a difference in blood pressure between your right and left arms. If you have a high blood pressure reading during one visit or you have normal blood pressure with other risk factors, you may be asked to: Return on a different day to have your blood pressure checked again. Monitor your blood pressure at home for 1 week or longer. If you are diagnosed with hypertension, you may have other blood or imaging tests to help your health care provider understand your overall risk for other conditions. How is this treated? This condition is treated by making healthy lifestyle changes, such as eating healthy foods, exercising more, and reducing your alcohol intake. You may be referred for counseling on a healthy diet and physical activity. Your health care provider may prescribe medicine if lifestyle changes are not enough to get your blood pressure under control and if: Your systolic blood pressure is above 130. Your diastolic blood pressure is above 80. Your personal target blood pressure may vary depending on your medical conditions, your age, and other factors. Follow these instructions at home: Eating and drinking  Eat a diet that is high in fiber and potassium, and low in sodium, added sugar, and fat. An example of this eating plan is called the DASH diet. DASH stands for Dietary Approaches to Stop Hypertension. To eat this way: Eat   plenty of fresh fruits and vegetables. Try to fill one half of your plate at each meal with fruits and vegetables. Eat whole grains, such as whole-wheat pasta, brown rice, or whole-grain bread. Fill about one  fourth of your plate with whole grains. Eat or drink low-fat dairy products, such as skim milk or low-fat yogurt. Avoid fatty cuts of meat, processed or cured meats, and poultry with skin. Fill about one fourth of your plate with lean proteins, such as fish, chicken without skin, beans, eggs, or tofu. Avoid pre-made and processed foods. These tend to be higher in sodium, added sugar, and fat. Reduce your daily sodium intake. Many people with hypertension should eat less than 1,500 mg of sodium a day. Do not drink alcohol if: Your health care provider tells you not to drink. You are pregnant, may be pregnant, or are planning to become pregnant. If you drink alcohol: Limit how much you have to: 0-1 drink a day for women. 0-2 drinks a day for men. Know how much alcohol is in your drink. In the [place] one drink equals one 12 oz bottle of beer (355 mL), one 5 oz glass of wine (148 mL), or one 1 oz glass of hard liquor (44 mL). Lifestyle  Work with your health care provider to maintain a healthy body weight or to lose weight. Ask what an ideal weight is for you. Get at least 30 minutes of exercise that causes your heart to beat faster (aerobic exercise) most days of the week. Activities may include walking, swimming, or biking. Include exercise to strengthen your muscles (resistance exercise), such as Pilates or lifting weights, as part of your weekly exercise routine. Try to do these types of exercises for 30 minutes at least 3 days a week. Do not use any products that contain nicotine or tobacco. These products include cigarettes, chewing tobacco, and vaping devices, such as e-cigarettes. If you need help quitting, ask your health care provider. Monitor your blood pressure at home as told by your health care provider. Keep all follow-up visits. This is important. Medicines Take over-the-counter and prescription medicines only as told by your health care provider. Follow directions carefully. Blood  pressure medicines must be taken as prescribed. Do not skip doses of blood pressure medicine. Doing this puts you at risk for problems and can make the medicine less effective. Ask your health care provider about side effects or reactions to medicines that you should watch for. Contact a health care provider if you: Think you are having a reaction to a medicine you are taking. Have headaches that keep coming back (recurring). Feel dizzy. Have swelling in your ankles. Have trouble with your vision. Get help right away if you: Develop a severe headache or confusion. Have unusual weakness or numbness. Feel faint. Have severe pain in your chest or abdomen. Vomit repeatedly. Have trouble breathing. These symptoms may be an emergency. Get help right away. Call 911. Do not wait to see if the symptoms will go away. Do not drive yourself to the hospital. Summary Hypertension is when the force of blood pumping through your arteries is too strong. If this condition is not controlled, it may put you at risk for serious complications. Your personal target blood pressure may vary depending on your medical conditions, your age, and other factors. For most people, a normal blood pressure is less than 120/80. Hypertension is treated with lifestyle changes, medicines, or a combination of both. Lifestyle changes include losing weight, eating a healthy,   low-sodium diet, exercising more, and limiting alcohol. This information is not intended to replace advice given to you by your health care provider. Make sure you discuss any questions you have with your health care provider. Document Revised: 01/07/2021 Document Reviewed: 01/07/2021 Elsevier Patient Education  2024 Elsevier Inc.  

## 2022-08-19 NOTE — Progress Notes (Signed)
Subjective:  Patient ID: Jamie Jenkins, female    DOB: [birth year]  Age: 62 y.o. MRN: [id number]  CC: Hypertension and Hyperlipidemia   HPI Jamie Jenkins presents for f/up -  She does not exercise but she is active and complains of dyspnea on exertion.  She denies chest pain, diaphoresis, edema, dizziness, or lightheadedness.  Outpatient Medications Prior to Visit  Medication Sig Dispense Refill   BIOTIN PO Take 2 tablets by mouth in the morning.     cholecalciferol (D-VI-SOL) 10 MCG/ML LIQD Take 400 Units by mouth daily.     hydrochlorothiazide (HYDRODIURIL) 25 MG tablet Take 1 tablet (25 mg total) by mouth daily. Follow-up appt is due must see MD for refills 30 tablet 0   No facility-administered medications prior to visit.    ROS Review of Systems  Constitutional:  Negative for diaphoresis and fatigue.  HENT: Negative.    Eyes: Negative.   Respiratory:  Positive for shortness of breath. Negative for cough, chest tightness and wheezing.   Cardiovascular:  Negative for chest pain, palpitations and leg swelling.  Gastrointestinal:  Negative for abdominal pain, constipation, diarrhea, nausea and vomiting.  Endocrine: Negative.   Genitourinary: Negative.  Negative for difficulty urinating and dysuria.  Musculoskeletal: Negative.   Skin: Negative.   Neurological:  Negative for dizziness, weakness, light-headedness, numbness and headaches.  Hematological:  Negative for adenopathy. Does not bruise/bleed easily.  Psychiatric/Behavioral: Negative.      Objective:  BP 122/64 (BP Location: Left Arm, Patient Position: Sitting, Cuff Size: Large)   Pulse 67   Temp 98 F (36.7 C) (Oral)   Ht 5\' 6"  (1.676 m)   Wt 213 lb (96.6 kg)   LMP 06/24/2012   SpO2 93%   BMI 34.38 kg/m   BP Readings from Last 3 Encounters:  08/19/22 122/64  10/14/21 126/82  05/19/21 118/73    Wt Readings from Last 3 Encounters:  08/19/22 213 lb (96.6 kg)  10/14/21 222 lb (100.7 kg)  05/19/21  213 lb (96.6 kg)    Physical Exam Vitals reviewed.  Constitutional:      Appearance: She is not ill-appearing.  HENT:     Nose: Nose normal.     Mouth/Throat:     Mouth: Mucous membranes are moist.  Eyes:     General: No scleral icterus.    Conjunctiva/sclera: Conjunctivae normal.  Cardiovascular:     Rate and Rhythm: Normal rate and regular rhythm.     Heart sounds: Normal heart sounds, S1 normal and S2 normal. No murmur heard.    No gallop.     Comments: EKG- Unusual P axis, 63 bpm RAD Inverted/Flat T waves in anterior/lateral leads Prolonged QT No LVH or Q waves Pulmonary:     Effort: Pulmonary effort is normal.     Breath sounds: No stridor. No wheezing, rhonchi or rales.  Abdominal:     General: Abdomen is flat.     Palpations: There is no mass.     Tenderness: There is no abdominal tenderness. There is no guarding.     Hernia: No hernia is present.  Musculoskeletal:     Cervical back: Neck supple.     Right lower leg: No edema.     Left lower leg: No edema.  Skin:    General: Skin is warm and dry.  Neurological:     General: No focal deficit present.     Mental Status: She is alert. Mental status is at baseline.  Psychiatric:  Mood and Affect: Mood normal.        Behavior: Behavior normal.     Lab Results  Component Value Date   WBC 6.7 08/19/2022   HGB 15.4 (H) 08/19/2022   HCT 45.8 08/19/2022   PLT 245.0 08/19/2022   GLUCOSE 96 08/19/2022   CHOL 143 08/19/2022   TRIG 268.0 (H) 08/19/2022   HDL 59.50 08/19/2022   LDLDIRECT 57.0 08/19/2022   LDLCALC 56 02/25/2021   ALT 26 08/19/2022   AST 29 08/19/2022   NA 140 08/19/2022   K 3.8 08/19/2022   CL 102 08/19/2022   CREATININE 0.93 08/19/2022   BUN 7 08/19/2022   CO2 24 08/19/2022   TSH 2.70 08/19/2022   INR 1.01 07/16/2017   HGBA1C 5.5 02/13/2019    MM 3D SCREEN BREAST BILATERAL  Result Date: 05/14/2022 CLINICAL DATA:  Screening. EXAM: DIGITAL SCREENING BILATERAL MAMMOGRAM WITH  TOMOSYNTHESIS AND CAD TECHNIQUE: Bilateral screening digital craniocaudal and mediolateral oblique mammograms were obtained. Bilateral screening digital breast tomosynthesis was performed. The images were evaluated with computer-aided detection. COMPARISON:  Previous exam(s). ACR Breast Density Category a: The breasts are almost entirely fatty. FINDINGS: There are no findings suspicious for malignancy. IMPRESSION: No mammographic evidence of malignancy. A result letter of this screening mammogram will be mailed directly to the patient. RECOMMENDATION: Screening mammogram in one year. (Code:SM-B-01Y) BI-RADS CATEGORY  1: Negative. Electronically Signed   By: Bary Richard M.D.   On: 05/14/2022 14:48    Assessment & Plan:   Essential hypertension-her blood pressure is overcontrolled.  Will discontinue the thiazide diuretic. -     TSH; Future -     Urinalysis, Routine w reflex microscopic; Future -     Hepatic function panel; Future -     CBC with Differential/Platelet; Future -     Basic metabolic panel; Future -     EKG 12-Lead  Stage 3a chronic kidney disease (HCC)- -     Urinalysis, Routine w reflex microscopic; Future -     Basic metabolic panel; Future  Gastroesophageal reflux disease without esophagitis -     CBC with Differential/Platelet; Future  Pure hyperglyceridemia-she has a low ASCVD risk score. -     Lipid panel; Future -     Hepatic function panel; Future  DOE (dyspnea on exertion)- Labs are reassuring.  I recommended that she undergo an echocardiogram to reassess the half Paff and evaluate for HFrEF. -     Troponin I (High Sensitivity); Future -     Brain natriuretic peptide; Future -     ECHOCARDIOGRAM COMPLETE; Future  Chronic heart failure with preserved ejection fraction (HCC) -     ECHOCARDIOGRAM COMPLETE; Future  Abnormal EKG -     ECHOCARDIOGRAM COMPLETE; Future  Other orders -     LDL cholesterol, direct     Follow-up: Return in about 6 months (around  02/18/2023).  Sanda Linger, MD

## 2022-08-20 ENCOUNTER — Telehealth: Payer: Self-pay | Admitting: Internal Medicine

## 2022-08-20 LAB — LDL CHOLESTEROL, DIRECT: Direct LDL: 57 mg/dL

## 2022-08-20 NOTE — Telephone Encounter (Signed)
Her BP is too low for HCTZ

## 2022-08-20 NOTE — Telephone Encounter (Signed)
Pt has been informed and expressed understanding.  

## 2022-08-20 NOTE — Telephone Encounter (Signed)
Pt called stating she have been to the pharmacy 3 times for her BP medication Dr. Yetta Barre suppose to be sending in yesterday the day of her appt. Please call pt when Rx is sent in pt is upset.

## 2022-08-21 ENCOUNTER — Encounter: Payer: Self-pay | Admitting: Internal Medicine

## 2022-08-24 ENCOUNTER — Telehealth (HOSPITAL_BASED_OUTPATIENT_CLINIC_OR_DEPARTMENT_OTHER): Payer: Self-pay | Admitting: *Deleted

## 2022-08-24 NOTE — Telephone Encounter (Signed)
Left message for patient to call and discuss scheduling the Echocardiogram ordered by Dr. Thomas Jones 

## 2022-10-02 ENCOUNTER — Ambulatory Visit (INDEPENDENT_AMBULATORY_CARE_PROVIDER_SITE_OTHER): Payer: Commercial Managed Care - HMO

## 2022-10-02 DIAGNOSIS — R0609 Other forms of dyspnea: Secondary | ICD-10-CM

## 2022-10-02 DIAGNOSIS — R9431 Abnormal electrocardiogram [ECG] [EKG]: Secondary | ICD-10-CM | POA: Diagnosis not present

## 2022-10-02 DIAGNOSIS — I5032 Chronic diastolic (congestive) heart failure: Secondary | ICD-10-CM | POA: Diagnosis not present

## 2022-10-02 DIAGNOSIS — I358 Other nonrheumatic aortic valve disorders: Secondary | ICD-10-CM

## 2022-10-02 LAB — ECHOCARDIOGRAM COMPLETE
Area-P 1/2: 5.13 cm2
S' Lateral: 3.09 cm

## 2022-10-02 NOTE — Progress Notes (Signed)
Patient's blood pressure 172/92 after echo. Patient stated her doctor had recently discontinued her blood pressure meds. Patient stated she is able to check her pressure at home. Advised patient to keep a log of her blood pressure for a few days and if remains high contact her primary.

## 2022-10-20 ENCOUNTER — Telehealth: Payer: Self-pay | Admitting: Internal Medicine

## 2022-10-20 NOTE — Telephone Encounter (Signed)
Pt has been informed of result note:   Jamie Jenkins --   Your ECHO test shows trivial mitral valve regurgitation and a mildly dilated left atrium. I am not concerned about this. Let me know if you have any questions.   Sanda Linger, MD  Written by Etta Grandchild, MD on 10/03/2022  5:16 PM EDT   Pt has been informed and expressed understanding.   Pt mentioned that she is always tired and she wonders if these results are the cause of that.

## 2022-10-20 NOTE — Telephone Encounter (Signed)
Patient called and said that she had an echo cardiogram 2 weeks ago at [facility] and she still has not heard anything back.  Please call patient and let her know results.

## 2022-10-20 NOTE — Telephone Encounter (Signed)
Pt has been informed and expressed understanding.  

## 2022-10-20 NOTE — Telephone Encounter (Signed)
No this would not cause fatigue

## 2022-10-22 ENCOUNTER — Other Ambulatory Visit: Payer: Self-pay | Admitting: Internal Medicine

## 2022-10-22 ENCOUNTER — Telehealth: Payer: Self-pay | Admitting: Internal Medicine

## 2022-10-22 DIAGNOSIS — I5032 Chronic diastolic (congestive) heart failure: Secondary | ICD-10-CM

## 2022-10-22 DIAGNOSIS — R0609 Other forms of dyspnea: Secondary | ICD-10-CM

## 2022-10-22 DIAGNOSIS — I340 Nonrheumatic mitral (valve) insufficiency: Secondary | ICD-10-CM | POA: Insufficient documentation

## 2022-10-22 NOTE — Telephone Encounter (Signed)
I have ordered a cardiology referral

## 2022-10-22 NOTE — Telephone Encounter (Signed)
Pt has been informed that cardiology referral was entered.

## 2022-10-22 NOTE — Telephone Encounter (Signed)
Pt called stating she wanting to speak with the nurse because she have a couple questions to ask. Pt would not get into depth with me please advise.

## 2022-10-22 NOTE — Telephone Encounter (Signed)
Pt has follow up questions about results.  Pt would like to know if anything needs to be done for the mitral valve regurgitation. Specifically, is that something that will gradually get worse? Is it any treatment for it?  Pt would like to know what she needs to do for feeling tired?  Please advise.

## 2022-11-06 ENCOUNTER — Ambulatory Visit: Payer: Commercial Managed Care - HMO | Attending: Cardiovascular Disease | Admitting: Cardiovascular Disease

## 2022-11-06 ENCOUNTER — Encounter: Payer: Self-pay | Admitting: Cardiovascular Disease

## 2022-11-06 VITALS — BP 132/84 | HR 56 | Ht 66.0 in | Wt 215.2 lb

## 2022-11-06 DIAGNOSIS — I10 Essential (primary) hypertension: Secondary | ICD-10-CM

## 2022-11-06 DIAGNOSIS — I340 Nonrheumatic mitral (valve) insufficiency: Secondary | ICD-10-CM

## 2022-11-06 DIAGNOSIS — R0609 Other forms of dyspnea: Secondary | ICD-10-CM | POA: Diagnosis not present

## 2022-11-06 DIAGNOSIS — E6609 Other obesity due to excess calories: Secondary | ICD-10-CM

## 2022-11-06 DIAGNOSIS — Z6831 Body mass index (BMI) 31.0-31.9, adult: Secondary | ICD-10-CM

## 2022-11-06 NOTE — Assessment & Plan Note (Signed)
BMI 35.  Referral to [facility] diet wellness center.

## 2022-11-06 NOTE — Assessment & Plan Note (Signed)
History of essential hypertension on hydrochlorothiazide the past which has recently been discontinued.  Blood pressure today is 132/84.

## 2022-11-06 NOTE — Patient Instructions (Signed)
Medication Instructions:  NO CHANGES  *If you need a refill on your cardiac medications before your next appointment, please call your pharmacy*   Testing/Procedures: Dr. Allyson Sabal has ordered a CT coronary calcium score.   Test locations:  [facility]  [place]  [facility]  This is $99 out of pocket.   Coronary CalciumScan A coronary calcium scan is an imaging test used to look for deposits of calcium and other fatty materials (plaques) in the inner lining of the blood vessels of the heart (coronary arteries). These deposits of calcium and plaques can partly clog and narrow the coronary arteries without producing any symptoms or warning signs. This puts a person at risk for a heart attack. This test can detect these deposits before symptoms develop. Tell a health care provider about: Any allergies you have. All medicines you are taking, including vitamins, herbs, eye drops, creams, and over-the-counter medicines. Any problems you or family members have had with anesthetic medicines. Any blood disorders you have. Any surgeries you have had. Any medical conditions you have. Whether you are pregnant or may be pregnant. What are the risks? Generally, this is a safe procedure. However, problems may occur, including: Harm to a pregnant woman and her unborn baby. This test involves the use of radiation. Radiation exposure can be dangerous to a pregnant woman and her unborn baby. If you are pregnant, you generally should not have this procedure done. Slight increase in the risk of cancer. This is because of the radiation involved in the test. What happens before the procedure? No preparation is needed for this procedure. What happens during the procedure? You will undress and remove any jewelry around your neck or chest. You will put on a hospital gown. Sticky electrodes will be placed on your chest. The  electrodes will be connected to an electrocardiogram (ECG) machine to record a tracing of the electrical activity of your heart. A CT scanner will take pictures of your heart. During this time, you will be asked to lie still and hold your breath for 2-3 seconds while a picture of your heart is being taken. The procedure may vary among health care providers and hospitals. What happens after the procedure? You can get dressed. You can return to your normal activities. It is up to you to get the results of your test. Ask your health care provider, or the department that is doing the test, when your results will be ready. Summary A coronary calcium scan is an imaging test used to look for deposits of calcium and other fatty materials (plaques) in the inner lining of the blood vessels of the heart (coronary arteries). Generally, this is a safe procedure. Tell your health care provider if you are pregnant or may be pregnant. No preparation is needed for this procedure. A CT scanner will take pictures of your heart. You can return to your normal activities after the scan is done. This information is not intended to replace advice given to you by your health care provider. Make sure you discuss any questions you have with your health care provider. Document Released: 08/29/2007 Document Revised: 01/20/2016 Document Reviewed: 01/20/2016 Elsevier Interactive Patient Education  2017 [employer].    Follow-Up: At [facility], you and your health needs are our priority.  As part of our continuing mission to provide you with exceptional heart care, we have created designated Provider Care Teams.  These Care Teams include your  primary Cardiologist (physician) and Advanced Practice Providers (APPs -  Physician Assistants and Nurse Practitioners) who all work together to provide you with the care you need, when you need it.  We recommend signing up for the patient portal called "MyChart".  Sign up  information is provided on this After Visit Summary.  MyChart is used to connect with patients for Virtual Visits (Telemedicine).  Patients are able to view lab/test results, encounter notes, upcoming appointments, etc.  Non-urgent messages can be sent to your provider as well.   To learn more about what you can do with MyChart, go to [email or web address].    Your next appointment:   AS NEEDED with Dr. Allyson Sabal  Other Instructions You have been referred to [facility]

## 2022-11-06 NOTE — Assessment & Plan Note (Signed)
Trivial by 2D echo performed 10/02/2022

## 2022-11-06 NOTE — Progress Notes (Signed)
11/06/2022 Jamie Jenkins   09-Nov-1960  [id number]  Primary Physician Etta Grandchild, MD Primary Cardiologist: Runell Gess MD Nicholes Calamity, [place]  HPI:  Jamie Jenkins is a 62 y.o. morbidly overweight married African-American female mother of 3 children, grandmother of 12 grandchildren who is accompanied by her husband Jamie Jenkins today.  She was referred by Dr. Yetta Barre, her PCP for cardiac evaluation because of trivial mitral regurgitation.  She is retired from working at a funeral home.  Her risk factors include minimal tobacco abuse but otherwise is negative.  There is no family history of heart disease.  She is never had a heart attack or stroke.  She does complain of dyspnea on exertion for the last 6 to 8 months with occasional atypical chest pain.  2D echocardiogram performed 10/02/2022 revealed normal LV systolic function with trivial MR.   Current Meds  Medication Sig   BIOTIN PO Take 2 tablets by mouth in the morning.   cholecalciferol (D-VI-SOL) 10 MCG/ML LIQD Take 400 Units by mouth daily.     No Known Allergies  Social History   Socioeconomic History   Marital status: Married    Spouse name: Not on file   Number of children: 3   Years of education: 12   Highest education level: Not on file  Occupational History   Occupation: Proofreader  Tobacco Use   Smoking status: Never   Smokeless tobacco: Never  Vaping Use   Vaping status: Never Used  Substance and Sexual Activity   Alcohol use: Not Currently    Alcohol/week: 0.0 standard drinks of alcohol   Drug use: No   Sexual activity: Not Currently    Partners: Male    Birth control/protection: Post-menopausal  Other Topics Concern   Not on file  Social History Narrative   Born and raised in [place], [place]. Currently resides in a house with her husband in a one story home. No pets. Fun: Go to the beach;    Denies religious beliefs that would effect health care.    Has 3 children.    Works as  a Marine scientist.  Education: some college.   Social Determinants of Health   Financial Resource Strain: Not on file  Food Insecurity: Not on file  Transportation Needs: Not on file  Physical Activity: Not on file  Stress: Not on file  Social Connections: Not on file  Intimate Partner Violence: Not on file     Review of Systems: General: negative for chills, fever, night sweats or weight changes.  Cardiovascular: negative for chest pain, dyspnea on exertion, edema, orthopnea, palpitations, paroxysmal nocturnal dyspnea or shortness of breath Dermatological: negative for rash Respiratory: negative for cough or wheezing Urologic: negative for hematuria Abdominal: negative for nausea, vomiting, diarrhea, bright red blood per rectum, melena, or hematemesis Neurologic: negative for visual changes, syncope, or dizziness All other systems reviewed and are otherwise negative except as noted above.    Blood pressure 132/84, pulse (!) 56, height 5\' 6"  (1.676 m), weight 215 lb 3.2 oz (97.6 kg), last menstrual period 06/24/2012, SpO2 95%.  General appearance: alert and no distress Neck: no adenopathy, no carotid bruit, no JVD, supple, symmetrical, trachea midline, and thyroid not enlarged, symmetric, no tenderness/mass/nodules Lungs: clear to auscultation bilaterally Heart: Regular rate and rhythm without murmurs gallops rubs or clicks Extremities: extremities normal, atraumatic, no cyanosis or edema Pulses: 2+ and symmetric Skin: Skin color, texture, turgor normal. No rashes or lesions Neurologic: Grossly  normal  EKG not performed today      ASSESSMENT AND PLAN:   Essential hypertension History of essential hypertension on hydrochlorothiazide the past which has recently been discontinued.  Blood pressure today is 132/84.  Obesity BMI 35.  Referral to [facility] wellness center.  DOE (dyspnea on exertion) Probably multifactorial from obesity and deconditioning.  2D echo was  essentially normal.  I am going to get a coronary calcium score to risk stratify.  Mild mitral regurgitation Trivial by 2D echo performed 10/02/2022     Runell Gess MD [facility], [facility] 11/06/2022 12:15 PM

## 2022-11-06 NOTE — Assessment & Plan Note (Signed)
Probably multifactorial from obesity and deconditioning.  2D echo was essentially normal.  I am going to get a coronary calcium score to risk stratify.

## 2022-12-10 ENCOUNTER — Ambulatory Visit (HOSPITAL_BASED_OUTPATIENT_CLINIC_OR_DEPARTMENT_OTHER): Payer: Commercial Managed Care - HMO

## 2023-01-27 ENCOUNTER — Ambulatory Visit: Payer: Commercial Managed Care - HMO

## 2023-01-27 DIAGNOSIS — Z111 Encounter for screening for respiratory tuberculosis: Secondary | ICD-10-CM

## 2023-01-27 NOTE — Progress Notes (Signed)
After obtaining consent, and per orders of Dr. Yetta Barre, injection of PPD was given by Ferdie Ping. Patient instructed to report back on Friday for reading.

## 2023-01-29 ENCOUNTER — Ambulatory Visit: Payer: Managed Care, Other (non HMO)

## 2023-01-29 LAB — TB SKIN TEST
Induration: 0 mm
TB Skin Test: NEGATIVE

## 2023-01-29 NOTE — Progress Notes (Signed)
Tuberculin skin test applied to Left ventral forearm. Reading was negative, measuring induration 0 mm with no erythema.

## 2023-04-26 ENCOUNTER — Other Ambulatory Visit: Payer: Self-pay | Admitting: Internal Medicine

## 2023-04-26 DIAGNOSIS — Z1231 Encounter for screening mammogram for malignant neoplasm of breast: Secondary | ICD-10-CM

## 2023-05-17 ENCOUNTER — Ambulatory Visit
Admission: RE | Admit: 2023-05-17 | Discharge: 2023-05-17 | Disposition: A | Discharge status: Home / Self Care | Payer: Managed Care, Other (non HMO) | Source: Ambulatory Visit | Attending: Internal Medicine | Admitting: Internal Medicine

## 2023-05-17 DIAGNOSIS — Z1231 Encounter for screening mammogram for malignant neoplasm of breast: Secondary | ICD-10-CM

## 2024-02-03 ENCOUNTER — Other Ambulatory Visit: Payer: Self-pay

## 2024-02-03 ENCOUNTER — Ambulatory Visit (INDEPENDENT_AMBULATORY_CARE_PROVIDER_SITE_OTHER): Payer: Self-pay

## 2024-02-03 ENCOUNTER — Ambulatory Visit (HOSPITAL_COMMUNITY)
Admission: EM | Admit: 2024-02-03 | Discharge: 2024-02-03 | Disposition: A | Discharge status: Home / Self Care | Payer: Self-pay | Attending: Internal Medicine | Admitting: Internal Medicine

## 2024-02-03 ENCOUNTER — Encounter (HOSPITAL_COMMUNITY): Payer: Self-pay | Admitting: Emergency Medicine

## 2024-02-03 DIAGNOSIS — R051 Acute cough: Secondary | ICD-10-CM

## 2024-02-03 DIAGNOSIS — J22 Unspecified acute lower respiratory infection: Secondary | ICD-10-CM

## 2024-02-03 DIAGNOSIS — R0602 Shortness of breath: Secondary | ICD-10-CM

## 2024-02-03 MED ORDER — ALBUTEROL SULFATE HFA 108 (90 BASE) MCG/ACT IN AERS: 1.0000 | INHALATION_SPRAY | Freq: Four times a day (QID) | RESPIRATORY_TRACT | 0 refills | Status: AC | PRN

## 2024-02-03 MED ORDER — BENZONATATE 100 MG PO CAPS: 100.0000 mg | ORAL_CAPSULE | Freq: Three times a day (TID) | ORAL | 0 refills | Status: AC

## 2024-02-03 MED ORDER — PREDNISONE 20 MG PO TABS: 40.0000 mg | ORAL_TABLET | Freq: Every day | ORAL | 0 refills | Status: AC

## 2024-02-03 MED ORDER — AZITHROMYCIN 250 MG PO TABS: ORAL_TABLET | ORAL | 0 refills | Status: AC

## 2024-02-03 NOTE — ED Provider Notes (Signed)
 [facility]    CSN: [id number] Arrival date & time: 02/03/24  1706      History   Chief Complaint Chief Complaint  Patient presents with   Cough    HPI Jamie Jenkins is a 63 y.o. female.   63 year old female who presents urgent care with complaints of cough, shortness of breath, runny nose and fevers.  She reports this started about a week and a half ago.  Her symptoms have worsened.  The cough is especially severe especially at night.  She is now feeling shortness of breath at rest and with activity.  She has felt feverish as well.  She denies any chest pain, ear pain, sore throat.  She has tried Mucinex, elderberry, Alka-Seltzer cold and flu.   Cough Associated symptoms: fever and shortness of breath   Associated symptoms: no chest pain, no chills, no ear pain, no rash and no sore throat     Past Medical History:  Diagnosis Date   Ankle fracture    right   DDD (degenerative disc disease), lumbar    Gall stones    Hypertension     Patient Active Problem List   Diagnosis Date Noted   Mild mitral regurgitation 10/22/2022   Pure hyperglyceridemia 08/19/2022   Chronic heart failure with preserved ejection fraction (HCC) 08/19/2022   DOE (dyspnea on exertion) 08/19/2022   Estrogen deficiency 02/25/2021   Encounter for general adult medical examination with abnormal findings 03/12/2020   Stage 3a chronic kidney disease (HCC) 03/12/2020   Gastroesophageal reflux disease without esophagitis 02/13/2019   Screening for cervical cancer 02/13/2019   Visit for screening mammogram 02/13/2019   Vitamin D  deficiency disease 10/05/2017   Obesity 06/08/2016   DDD (degenerative disc disease), lumbar 05/21/2014   Essential hypertension 05/21/2014    Past Surgical History:  Procedure Laterality Date   ANKLE FUSION Left 07/21/2017   Procedure: SUBTALAR AND TALONAVICULAR FUSION LEFT FOOT;  Surgeon: Harden Jerona GAILS, MD;  Location: [facility] OR;  Service: Orthopedics;   Laterality: Left;   AXILLARY LYMPH NODE DISSECTION Left 11/24/2012   Procedure: incision and drainage axillary abscess;  Surgeon: Elon CHRISTELLA Pacini, MD;  Location: [facility] OR;  Service: General;  Laterality: Left;   BREAST SURGERY  03/16/2010   breast biopsy   CHOLECYSTECTOMY     COLONOSCOPY  05/19/2021   positive cologuard   HARDWARE REMOVAL Right 08/16/2020   Procedure: RIGHT ANKLE REMOVAL FIBULA HARDWARE;  Surgeon: Harden Jerona GAILS, MD;  Location: [facility] OR;  Service: Orthopedics;  Laterality: Right;   ORIF ANKLE FRACTURE Right 11/22/2019   Procedure: OPEN REDUCTION INTERNAL FIXATION (ORIF) RIGHT ANKLE FRACTURE;  Surgeon: Harden Jerona GAILS, MD;  Location: [facility] OR;  Service: Orthopedics;  Laterality: Right;   TUBAL LIGATION      OB History     Gravida  3   Para      Term      Preterm      AB      Living  3      SAB      IAB      Ectopic      Multiple      Live Births  3            Home Medications    Prior to Admission medications   Medication Sig Start Date End Date Taking? Authorizing Provider  albuterol  (VENTOLIN  HFA) 108 (90 Base) MCG/ACT inhaler Inhale 1-2 puffs into the lungs every 6 (six) hours as  needed for wheezing or shortness of breath. 02/03/24  Yes Marylan Glore A, PA-C  azithromycin (ZITHROMAX) 250 MG tablet Take first 2 tablets together, then 1 every day until finished. 02/03/24  Yes Geanine Vandekamp A, PA-C  benzonatate (TESSALON) 100 MG capsule Take 1 capsule (100 mg total) by mouth every 8 (eight) hours. 02/03/24  Yes Dejan Angert A, PA-C  predniSONE  (DELTASONE ) 20 MG tablet Take 2 tablets (40 mg total) by mouth daily with breakfast for 5 days. 02/03/24 02/08/24 Yes Marilynne Dupuis A, PA-C  BIOTIN PO Take 2 tablets by mouth in the morning.    [provider]  cholecalciferol  (D-VI-SOL) 10 MCG/ML LIQD Take 400 Units by mouth daily.    [provider]  hydrochlorothiazide  (HYDRODIURIL ) 25 MG tablet Take 1 tablet (25 mg total) by  mouth daily. Follow-up appt is due must see MD for refills Patient not taking: Reported on 11/06/2022 06/08/22   Joshua Debby CROME, MD    Family History Family History  Problem Relation Age of Onset   Pancreatic cancer Mother        Deceased   Hypertension Father    Other Father        stomach aneurysm   Deep vein thrombosis Brother        Deceased   Colon cancer Neg Hx    Colon polyps Neg Hx     Social History Social History   Tobacco Use   Smoking status: Never   Smokeless tobacco: Never  Vaping Use   Vaping status: Never Used  Substance Use Topics   Alcohol use: Yes   Drug use: No     Allergies   Patient has no known allergies.   Review of Systems Review of Systems  Constitutional:  Positive for fever. Negative for chills.  HENT:  Negative for ear pain and sore throat.   Eyes:  Negative for pain and visual disturbance.  Respiratory:  Positive for cough and shortness of breath.   Cardiovascular:  Negative for chest pain and palpitations.  Gastrointestinal:  Negative for abdominal pain and vomiting.  Genitourinary:  Negative for dysuria and hematuria.  Musculoskeletal:  Negative for arthralgias and back pain.  Skin:  Negative for color change and rash.  Neurological:  Negative for seizures and syncope.  All other systems reviewed and are negative.    Physical Exam Triage Vital Signs ED Triage Vitals [02/03/24 1730]  Encounter Vitals Group     BP (!) 196/90     Girls Systolic BP Percentile      Girls Diastolic BP Percentile      Boys Systolic BP Percentile      Boys Diastolic BP Percentile      Pulse Rate 68     Resp (!) 24     Temp 98.5 F (36.9 C)     Temp Source Oral     SpO2 98 %     Weight      Height      Head Circumference      Peak Flow      Pain Score      Pain Loc      Pain Education      Exclude from Growth Chart    No data found.  Updated Vital Signs BP (!) 196/90 (BP Location: Right Arm) Comment (BP Location): large cuff  Pulse  68   Temp 98.5 F (36.9 C) (Oral)   Resp (!) 24   LMP 06/24/2012   SpO2 98%  Visual Acuity Right Eye Distance:   Left Eye Distance:   Bilateral Distance:    Right Eye Near:   Left Eye Near:    Bilateral Near:     Physical Exam Vitals and nursing note reviewed.  Constitutional:      General: She is not in acute distress.    Appearance: She is well-developed.  HENT:     Head: Normocephalic and atraumatic.  Eyes:     Conjunctiva/sclera: Conjunctivae normal.  Cardiovascular:     Rate and Rhythm: Normal rate and regular rhythm.     Heart sounds: No murmur heard. Pulmonary:     Effort: Pulmonary effort is normal. Tachypnea present. No respiratory distress.     Breath sounds: Examination of the right-upper field reveals wheezing. Examination of the left-upper field reveals wheezing. Examination of the left-middle field reveals rhonchi. Wheezing and rhonchi present. No decreased breath sounds.  Abdominal:     Palpations: Abdomen is soft.     Tenderness: There is no abdominal tenderness.  Musculoskeletal:        General: No swelling.     Cervical back: Neck supple.  Skin:    General: Skin is warm and dry.     Capillary Refill: Capillary refill takes less than 2 seconds.  Neurological:     Mental Status: She is alert.  Psychiatric:        Mood and Affect: Mood normal.      UC Treatments / Results  Labs (all labs ordered are listed, but only abnormal results are displayed) Labs Reviewed - No data to display  EKG   Radiology DG Chest 2 View Result Date: 02/03/2024 CLINICAL DATA:  Cough.  Shortness of breath. EXAM: CHEST - 2 VIEW COMPARISON:  Chest radiograph dated 05/20/2017. FINDINGS: No focal consolidation, pleural effusion, or pneumothorax. The cardiac silhouette is within normal limits. No acute osseous pathology. IMPRESSION: No active cardiopulmonary disease. Electronically Signed   By: Vanetta Chou M.D.   On: 02/03/2024 18:24    Procedures Procedures  (including critical care time)  Medications Ordered in UC Medications - No data to display  Initial Impression / Assessment and Plan / UC Course  I have reviewed the triage vital signs and the nursing notes.  Pertinent labs & imaging results that were available during my care of the patient were reviewed by me and considered in my medical decision making (see chart for details).     Acute cough - Plan: DG Chest 2 View, DG Chest 2 View  Shortness of breath - Plan: DG Chest 2 View, DG Chest 2 View  Lower respiratory infection   Chest x-ray done today.  Final evaluation by the radiologist does not show any acute findings. Symptoms, duration and physical exam findings are consistent with lower respiratory infection with acute bronchitis. Due to the duration and severity we will treat with the following:  Azithromycin 250mg  Take 2 tablets today and the 1 tablet daily for 4 more days.  Prednisone  40 mg (2 tablets) once daily for 5 days. Take this in the morning.  This is a steroid to help with inflammation and pain.  Do not take ibuprofen  while you are taking this medication.  It is okay to take Tylenol  Albuterol  inhaler 1-2 puffs every 6 hours as needed for wheezing/shortness of breath. Benzonatate (tessalon) 100 mg every 8 hours as needed for cough.   Make sure to stay hydrated by drinking plenty of water. Return to urgent care or PCP if symptoms worsen or  fail to resolve.    Final Clinical Impressions(s) / UC Diagnoses   Final diagnoses:  Acute cough  Shortness of breath  Lower respiratory infection     Discharge Instructions      Chest x-ray done today.  Final evaluation by the radiologist does not show any acute findings. Symptoms, duration and physical exam findings are consistent with lower respiratory infection with acute bronchitis. Due to the duration and severity we will treat with the following:  Azithromycin 250mg  Take 2 tablets today and the 1 tablet daily for 4 more  days.  Prednisone  40 mg (2 tablets) once daily for 5 days. Take this in the morning.  This is a steroid to help with inflammation and pain.  Do not take ibuprofen  while you are taking this medication.  It is okay to take Tylenol  Albuterol  inhaler 1-2 puffs every 6 hours as needed for wheezing/shortness of breath. Benzonatate (tessalon) 100 mg every 8 hours as needed for cough.   Make sure to stay hydrated by drinking plenty of water. Return to urgent care or PCP if symptoms worsen or fail to resolve.       ED Prescriptions     Medication Sig Dispense Auth. Provider   azithromycin (ZITHROMAX) 250 MG tablet Take first 2 tablets together, then 1 every day until finished. 6 tablet Teresa Norris A, PA-C   albuterol  (VENTOLIN  HFA) 108 (90 Base) MCG/ACT inhaler Inhale 1-2 puffs into the lungs every 6 (six) hours as needed for wheezing or shortness of breath. 6.7 g Kalandra Masters A, PA-C   benzonatate (TESSALON) 100 MG capsule Take 1 capsule (100 mg total) by mouth every 8 (eight) hours. 21 capsule Vignesh Willert A, PA-C   predniSONE  (DELTASONE ) 20 MG tablet Take 2 tablets (40 mg total) by mouth daily with breakfast for 5 days. 10 tablet Teresa Norris LABOR, [place]      PDMP not reviewed this encounter.   Teresa Norris LABOR, [place] 02/03/24 1836

## 2024-02-03 NOTE — Discharge Instructions (Addendum)
 Chest x-ray done today.  Final evaluation by the radiologist does not show any acute findings. Symptoms, duration and physical exam findings are consistent with lower respiratory infection with acute bronchitis. Due to the duration and severity we will treat with the following:  Azithromycin 250mg  Take 2 tablets today and the 1 tablet daily for 4 more days.  Prednisone  40 mg (2 tablets) once daily for 5 days. Take this in the morning.  This is a steroid to help with inflammation and pain.  Do not take ibuprofen  while you are taking this medication.  It is okay to take Tylenol  Albuterol  inhaler 1-2 puffs every 6 hours as needed for wheezing/shortness of breath. Benzonatate (tessalon) 100 mg every 8 hours as needed for cough.   Make sure to stay hydrated by drinking plenty of water. Return to urgent care or PCP if symptoms worsen or fail to resolve.

## 2024-02-03 NOTE — ED Triage Notes (Signed)
 Cough for 1 1/2 weeks.  Reports having a runny nose and felt feverish initially.  Did not check fever.  Phlegm is yellow sometimes and clear sometimes.  Patient reports cough is worse at night.    Mucinex , alka seltzer cold and fever, elderberry, turmeric
